# Patient Record
Sex: Female | Born: 1935
Health system: Southern US, Community
[De-identification: ages and names within clinical notes are randomized; demographics above are authoritative.]

## PROBLEM LIST (undated history)

## (undated) DIAGNOSIS — R319 Hematuria, unspecified: Secondary | ICD-10-CM

## (undated) DIAGNOSIS — H3530 Unspecified macular degeneration: Secondary | ICD-10-CM

## (undated) DIAGNOSIS — N2889 Other specified disorders of kidney and ureter: Secondary | ICD-10-CM

## (undated) DIAGNOSIS — R300 Dysuria: Secondary | ICD-10-CM

## (undated) DIAGNOSIS — N183 Chronic kidney disease, stage 3 unspecified: Secondary | ICD-10-CM

## (undated) DIAGNOSIS — H9190 Unspecified hearing loss, unspecified ear: Secondary | ICD-10-CM

## (undated) DIAGNOSIS — F0390 Unspecified dementia without behavioral disturbance: Secondary | ICD-10-CM

## (undated) DIAGNOSIS — Z87898 Personal history of other specified conditions: Secondary | ICD-10-CM

## (undated) DIAGNOSIS — R001 Bradycardia, unspecified: Secondary | ICD-10-CM

## (undated) DIAGNOSIS — Z8554 Personal history of malignant neoplasm of ureter: Secondary | ICD-10-CM

## (undated) DIAGNOSIS — Z8619 Personal history of other infectious and parasitic diseases: Secondary | ICD-10-CM

## (undated) HISTORY — PX: VAGINAL HYSTERECTOMY: SUR661

## (undated) HISTORY — PX: REFRACTIVE SURGERY: SHX103

## (undated) HISTORY — PX: APPENDECTOMY: SHX54

---

## 1998-05-21 ENCOUNTER — Other Ambulatory Visit: Admission: RE | Admit: 1998-05-21 | Discharge: 1998-05-21 | Payer: Self-pay | Admitting: Gynecology

## 1999-07-01 ENCOUNTER — Other Ambulatory Visit: Admission: RE | Admit: 1999-07-01 | Discharge: 1999-07-01 | Payer: Self-pay | Admitting: Gynecology

## 1999-07-27 ENCOUNTER — Encounter: Payer: Self-pay | Admitting: Gynecology

## 1999-07-27 ENCOUNTER — Encounter: Admission: RE | Admit: 1999-07-27 | Discharge: 1999-07-27 | Payer: Self-pay | Admitting: Gynecology

## 2000-08-01 ENCOUNTER — Encounter: Payer: Self-pay | Admitting: Gynecology

## 2000-08-01 ENCOUNTER — Other Ambulatory Visit: Admission: RE | Admit: 2000-08-01 | Discharge: 2000-08-01 | Payer: Self-pay | Admitting: Gynecology

## 2000-08-01 ENCOUNTER — Encounter: Admission: RE | Admit: 2000-08-01 | Discharge: 2000-08-01 | Payer: Self-pay | Admitting: Gynecology

## 2001-08-16 ENCOUNTER — Encounter: Payer: Self-pay | Admitting: Gynecology

## 2001-08-16 ENCOUNTER — Encounter: Admission: RE | Admit: 2001-08-16 | Discharge: 2001-08-16 | Payer: Self-pay | Admitting: Gynecology

## 2001-08-16 ENCOUNTER — Other Ambulatory Visit: Admission: RE | Admit: 2001-08-16 | Discharge: 2001-08-16 | Payer: Self-pay | Admitting: Gynecology

## 2002-08-27 ENCOUNTER — Encounter: Payer: Self-pay | Admitting: Gynecology

## 2002-08-27 ENCOUNTER — Encounter: Admission: RE | Admit: 2002-08-27 | Discharge: 2002-08-27 | Payer: Self-pay | Admitting: Gynecology

## 2002-09-02 ENCOUNTER — Other Ambulatory Visit: Admission: RE | Admit: 2002-09-02 | Discharge: 2002-09-02 | Payer: Self-pay | Admitting: Gynecology

## 2003-03-01 HISTORY — PX: CATARACT EXTRACTION W/ INTRAOCULAR LENS  IMPLANT, BILATERAL: SHX1307

## 2003-10-14 ENCOUNTER — Encounter: Admission: RE | Admit: 2003-10-14 | Discharge: 2003-10-14 | Payer: Self-pay | Admitting: Gynecology

## 2004-02-03 ENCOUNTER — Other Ambulatory Visit: Admission: RE | Admit: 2004-02-03 | Discharge: 2004-02-03 | Payer: Self-pay | Admitting: Gynecology

## 2004-10-28 ENCOUNTER — Encounter: Admission: RE | Admit: 2004-10-28 | Discharge: 2004-10-28 | Payer: Self-pay | Admitting: Gynecology

## 2005-05-10 ENCOUNTER — Other Ambulatory Visit: Admission: RE | Admit: 2005-05-10 | Discharge: 2005-05-10 | Payer: Self-pay | Admitting: Gynecology

## 2006-05-25 ENCOUNTER — Encounter: Admission: RE | Admit: 2006-05-25 | Discharge: 2006-05-25 | Payer: Self-pay | Admitting: Gynecology

## 2006-05-25 ENCOUNTER — Other Ambulatory Visit: Admission: RE | Admit: 2006-05-25 | Discharge: 2006-05-25 | Payer: Self-pay | Admitting: Gynecology

## 2007-08-27 ENCOUNTER — Ambulatory Visit (HOSPITAL_COMMUNITY): Admission: RE | Admit: 2007-08-27 | Discharge: 2007-08-27 | Payer: Self-pay | Admitting: Ophthalmology

## 2007-10-25 ENCOUNTER — Encounter: Admission: RE | Admit: 2007-10-25 | Discharge: 2007-10-25 | Payer: Self-pay | Admitting: Gynecology

## 2008-12-25 ENCOUNTER — Encounter: Admission: RE | Admit: 2008-12-25 | Discharge: 2008-12-25 | Payer: Self-pay | Admitting: Gynecology

## 2009-12-14 ENCOUNTER — Ambulatory Visit (HOSPITAL_COMMUNITY)
Admission: RE | Admit: 2009-12-14 | Discharge: 2009-12-14 | Payer: Self-pay | Source: Home / Self Care | Admitting: Ophthalmology

## 2010-01-26 ENCOUNTER — Encounter: Admission: RE | Admit: 2010-01-26 | Discharge: 2010-01-26 | Payer: Self-pay | Admitting: Gynecology

## 2011-03-21 ENCOUNTER — Other Ambulatory Visit: Payer: Self-pay | Admitting: Gynecology

## 2011-03-21 DIAGNOSIS — Z1231 Encounter for screening mammogram for malignant neoplasm of breast: Secondary | ICD-10-CM

## 2011-03-22 ENCOUNTER — Ambulatory Visit
Admission: RE | Admit: 2011-03-22 | Discharge: 2011-03-22 | Disposition: A | Discharge status: Home / Self Care | Payer: Medicare Other | Source: Ambulatory Visit | Attending: Gynecology | Admitting: Gynecology

## 2011-03-22 DIAGNOSIS — Z1231 Encounter for screening mammogram for malignant neoplasm of breast: Secondary | ICD-10-CM

## 2011-10-27 ENCOUNTER — Encounter: Payer: Self-pay | Admitting: Cardiology

## 2011-10-27 ENCOUNTER — Ambulatory Visit (INDEPENDENT_AMBULATORY_CARE_PROVIDER_SITE_OTHER): Payer: Medicare Other | Admitting: Cardiology

## 2011-10-27 VITALS — BP 150/75 | HR 53 | Ht 63.0 in | Wt 130.0 lb

## 2011-10-27 DIAGNOSIS — R55 Syncope and collapse: Secondary | ICD-10-CM | POA: Insufficient documentation

## 2011-10-27 NOTE — Progress Notes (Signed)
HPI The patient presents after a syncopal episode. She has no past cardiac history. She has had no previous cardiovascular studies. She was in her usual state of health Saturday when she was found down in her shower. She had apparently lost consciousness and was found by her husband. It's unclear how long she was down. She didn't have any loss of bowel or bladder. She had no trauma. She knew where she was when she came to. She did not have any prodrome her prior to this. She's not been having any palpitations or orthostatic symptoms. She had no presyncope. She denies any chest pressure, neck or arm discomfort. A relative took her blood pressure after this was normal as was her blood sugar. Before that she felt well and says that she felt well. She is active physically though she doesn't exercise. With activities such as vacuuming she's had no symptoms.  Allergies  Allergen Reactions  . Penicillins     Current Outpatient Prescriptions  Medication Sig Dispense Refill  . aspirin 81 MG tablet Take 81 mg by mouth daily.      . Cholecalciferol (VITAMIN D PO) Take by mouth.        Past Medical History  Diagnosis Date  . Syncope and collapse   . Memory loss   . Hearing difficulty     Past Surgical History  Procedure Date  . Appendectomy   . Vaginal hysterectomy     Family History  Problem Relation Age of Onset  . Coronary artery disease Father 37    Died 12s of an MI  . Coronary artery disease Brother 34  . Coronary artery disease Sister 40    History   Social History  . Marital Status: Married    Spouse Name: N/A    Number of Children: 2  . Years of Education: N/A   Occupational History  . Two part time jobs.     Social History Main Topics  . Smoking status: Former Smoker    Quit date: 10/27/1955  . Smokeless tobacco: Not on file  . Alcohol Use: Not on file  . Drug Use: Not on file  . Sexually Active: Not on file   Other Topics Concern  . Not on file   Social  History Narrative   Lives at home with husband.   She works at a [employer].    ROS:  Positive for hearing loss. Otherwise as stated in the history of present illness and negative for all other systems.  PHYSICAL EXAM BP 150/75  Pulse 53  Ht 5\' 3"  (1.6 m)  Wt 130 lb (58.968 kg)  BMI 23.03 kg/m2 GENERAL:  Well appearing HEENT:  Pupils equal round and reactive, fundi not visualized, oral mucosa unremarkable NECK:  No jugular venous distention, waveform within normal limits, carotid upstroke brisk and symmetric, no bruits, no thyromegaly LYMPHATICS:  No cervical, inguinal adenopathy LUNGS:  Clear to auscultation bilaterally BACK:  No CVA tenderness CHEST:  Unremarkable HEART:  PMI not displaced or sustained,S1 and S2 within normal limits, no S3, no S4, no clicks, no rubs, no murmurs ABD:  Flat, positive bowel sounds normal in frequency in pitch, no bruits, no rebound, no guarding, no midline pulsatile mass, no hepatomegaly, no splenomegaly EXT:  2 plus pulses throughout, no edema, no cyanosis no clubbing SKIN:  No rashes no nodules NEURO:  Cranial nerves II through XII grossly intact, motor grossly intact throughout PSYCH:  Cognitively intact, oriented to person place  and time   EKG:  Sinus bradycardia, rate 53, rightward axis, premature atrial contractions, nonspecific T-wave flattening.  ASSESSMENT AND PLAN  Syncope:  The patient had unexplained syncope that may have been a vagal episode. The only objective finding is bradycardia. I will apply a 48 hour Holter. However, given her completely benign exam I don't think that further evaluation is needed.  Bradycardia:  This will be evaluated as above.

## 2011-10-27 NOTE — Patient Instructions (Addendum)
The current medical regimen is effective;  continue present plan and medications.  Your physician has recommended that you wear a holter monitor for 48 hours. Holter monitors are medical devices that record the heart's electrical activity. Doctors most often use these monitors to diagnose arrhythmias. Arrhythmias are problems with the speed or rhythm of the heartbeat. The monitor is a small, portable device. You can wear one while you do your normal daily activities. This is usually used to diagnose what is causing palpitations/syncope (passing out).  Follow up in 1 month with Dr Hochrein.   

## 2011-11-02 ENCOUNTER — Telehealth: Payer: Self-pay | Admitting: Cardiology

## 2011-11-02 NOTE — Telephone Encounter (Signed)
Please return call to patient at work [phone] regarding test results

## 2011-11-02 NOTE — Telephone Encounter (Signed)
Reviewed results of holter monitor which demonstrated NSR and Sinus Huston Foley. Pt is aware that Dr Antoine Poche has not reviewed this results as of yet and I will call her back with his recommendations once he does.  Pt states understanding.

## 2011-11-23 ENCOUNTER — Ambulatory Visit (INDEPENDENT_AMBULATORY_CARE_PROVIDER_SITE_OTHER): Payer: Medicare Other | Admitting: Cardiology

## 2011-11-23 ENCOUNTER — Encounter: Payer: Self-pay | Admitting: Cardiology

## 2011-11-23 VITALS — BP 140/75 | HR 64 | Ht 63.0 in | Wt 131.0 lb

## 2011-11-23 DIAGNOSIS — R55 Syncope and collapse: Secondary | ICD-10-CM

## 2011-11-23 NOTE — Progress Notes (Signed)
    HPI The patient presents after a syncopal episode. She has no past cardiac history. She has had no previous cardiovascular studies. After the last visit I did order an Holter.  This demonstrated only normal sinus rhythm, PVCs, occasional short runs of atrial tachycardia.  Since I last saw her she has had no new cardiovascular complaints. She denies any chest pressure, neck or arm discomfort. She has no new shortness of breath, PND or orthopnea. She has no new palpitations, presyncope or syncope.  Allergies  Allergen Reactions  . Penicillins     Current Outpatient Prescriptions  Medication Sig Dispense Refill  . aspirin 81 MG tablet Take 81 mg by mouth daily.      . Cholecalciferol (VITAMIN D PO) Take by mouth.      . NON FORMULARY daily. i pad eye solution        Past Medical History  Diagnosis Date  . Syncope and collapse   . Memory loss   . Hearing difficulty     Past Surgical History  Procedure Date  . Appendectomy   . Vaginal hysterectomy     ROS:  Positive for hearing loss. Otherwise as stated in the history of present illness and negative for all other systems.  PHYSICAL EXAM BP 140/75  Pulse 64  Ht 5\' 3"  (1.6 m)  Wt 131 lb (59.421 kg)  BMI 23.21 kg/m2 GENERAL:  Well appearing NECK:  No jugular venous distention, waveform within normal limits, carotid upstroke brisk and symmetric, no bruits, no thyromegaly LUNGS:  Clear to auscultation bilaterally BACK:  No CVA tenderness CHEST:  Unremarkable HEART:  PMI not displaced or sustained,S1 and S2 within normal limits, no S3, no S4, no clicks, no rubs, no murmurs ABD:  Flat, positive bowel sounds normal in frequency in pitch, no bruits, no rebound, no guarding, no midline pulsatile mass, no hepatomegaly, no splenomegaly EXT:  2 plus pulses throughout, no edema, no cyanosis no clubbing   ASSESSMENT AND PLAN  Syncope:  She has had no further episodes. There is no clear etiology. She is to let us know if she has any  presyncope or further syncopal episodes.  Bradycardia:  At this point she seems to have normal chronotropic response on her Holter. Her average heart rate was 62 with no sustained bradycardia arrhythmias and no association of bradycardia with symptoms. At this point no pacemaker is indicated.

## 2011-11-28 ENCOUNTER — Encounter: Payer: Self-pay | Admitting: Cardiology

## 2012-03-19 ENCOUNTER — Other Ambulatory Visit: Payer: Self-pay | Admitting: Gynecology

## 2012-03-19 DIAGNOSIS — Z1231 Encounter for screening mammogram for malignant neoplasm of breast: Secondary | ICD-10-CM

## 2012-05-31 ENCOUNTER — Ambulatory Visit: Payer: Medicare Other

## 2012-06-04 ENCOUNTER — Other Ambulatory Visit: Payer: Self-pay | Admitting: Family Medicine

## 2012-06-04 DIAGNOSIS — Z1231 Encounter for screening mammogram for malignant neoplasm of breast: Secondary | ICD-10-CM

## 2012-06-28 ENCOUNTER — Ambulatory Visit
Admission: RE | Admit: 2012-06-28 | Discharge: 2012-06-28 | Disposition: A | Discharge status: Home / Self Care | Payer: Medicare Other | Source: Ambulatory Visit | Attending: Family Medicine | Admitting: Family Medicine

## 2012-06-28 ENCOUNTER — Ambulatory Visit: Payer: Medicare Other

## 2012-06-28 DIAGNOSIS — Z1231 Encounter for screening mammogram for malignant neoplasm of breast: Secondary | ICD-10-CM

## 2012-10-09 ENCOUNTER — Encounter: Payer: Self-pay | Admitting: Family Medicine

## 2012-10-09 ENCOUNTER — Ambulatory Visit (INDEPENDENT_AMBULATORY_CARE_PROVIDER_SITE_OTHER): Payer: Medicare Other | Admitting: Family Medicine

## 2012-10-09 VITALS — BP 142/67 | HR 55 | Temp 98.5°F | Ht 62.0 in | Wt 133.0 lb

## 2012-10-09 DIAGNOSIS — F0390 Unspecified dementia without behavioral disturbance: Secondary | ICD-10-CM

## 2012-10-09 LAB — POCT CBC
Granulocyte percent: 69.1 % (ref 37–80)
HCT, POC: 39.3 % (ref 37.7–47.9)
Hemoglobin: 13.1 g/dL (ref 12.2–16.2)
Lymph, poc: 1.5 (ref 0.6–3.4)
MCH, POC: 30.7 pg (ref 27–31.2)
MCHC: 33.3 g/dL (ref 31.8–35.4)
MCV: 92.2 fL (ref 80–97)
MPV: 8.6 fL (ref 0–99.8)
POC Granulocyte: 4.1 (ref 2–6.9)
POC LYMPH PERCENT: 24.4 % (ref 10–50)
Platelet Count, POC: 223 K/uL (ref 142–424)
RBC: 4.3 M/uL (ref 4.04–5.48)
RDW, POC: 14.4 %
WBC: 6 K/uL (ref 4.6–10.2)

## 2012-10-09 LAB — POCT GLYCOSYLATED HEMOGLOBIN (HGB A1C): Hemoglobin A1C: 5.1

## 2012-10-09 MED ORDER — DONEPEZIL HCL 5 MG PO TABS: 5.0000 mg | ORAL_TABLET | Freq: Every day | ORAL | Status: DC

## 2012-10-09 NOTE — Progress Notes (Signed)
  Subjective:    Patient ID: Katie Huerta, female    DOB: 10-28-1927, 79 y.o.   MRN: [id number]  HPI  Patient presents today with chief complaint of dementia. Husband states the patient had progressive decrease in memory of the past 3-5 years. Husband states the patient had recurrent episodes of forgetting short term as well as long term events. Patient is had issues with recurring episodes with money as well as dates and times of events. Husband has had to help patient keep up with medications at home. Patient denies any prior history of hypertension or diabetes. No history of stroke or heart attacks in the past. Patient did have a fall about 4-6 months ago that did not involve any head trauma. No recent infections. Appetite has been stable. Patient is able to eat for herself.  Does cooking and some cleaning.  No recent changes in behavior.   Review of Systems  All other systems reviewed and are negative.       Objective:   Physical Exam  Constitutional: She appears well-developed and well-nourished.  HENT:  Head: Normocephalic and atraumatic.  Right Ear: External ear normal.  Left Ear: External ear normal.  Eyes: Conjunctivae are normal. Pupils are equal, round, and reactive to light.  Neck: Normal range of motion.  Cardiovascular: Normal rate and regular rhythm.   Pulmonary/Chest: Effort normal and breath sounds normal.  Abdominal: Soft.  Neurological: She is alert. She displays normal reflexes. No cranial nerve deficit. Coordination normal.  Oriented to person place not time  Skin: Skin is warm.     Mini-Mental Status exam score of 23     Assessment & Plan:  Dementia, without behavioral disturbance - Plan: donepezil (ARICEPT) 5 MG tablet, POCT CBC, Comprehensive metabolic panel, TSH, POCT glycosylated hemoglobin (Hb A1C)  Patient clinically with mild dementia. Will start patient on low-dose Aricept. Will also check baseline labs including CBC, CMP, A1c,  TSH. Plan for followup in the next 3-6 months for reevaluation of symptoms. Discussed general red flags. Followup as needed

## 2012-10-10 LAB — COMPREHENSIVE METABOLIC PANEL
ALT: 11 IU/L (ref 0–32)
AST: 17 IU/L (ref 0–40)
Albumin: 4.1 g/dL (ref 3.5–4.7)
BUN: 17 mg/dL (ref 8–27)
Calcium: 9 mg/dL (ref 8.6–10.2)
Chloride: 104 mmol/L (ref 97–108)
GFR calc non Af Amer: 73 mL/min/{1.73_m2} (ref >59)
Globulin, Total: 2 g/dL (ref 1.5–4.5)
Potassium: 4.3 mmol/L (ref 3.5–5.2)
Total Protein: 6.1 g/dL (ref 6.0–8.5)

## 2012-10-16 ENCOUNTER — Telehealth: Payer: Self-pay | Admitting: Family Medicine

## 2012-10-16 NOTE — Telephone Encounter (Signed)
Patient aware copy up front for them to pick up

## 2013-02-19 ENCOUNTER — Ambulatory Visit: Payer: Medicare Other | Admitting: Family Medicine

## 2013-03-04 ENCOUNTER — Ambulatory Visit (INDEPENDENT_AMBULATORY_CARE_PROVIDER_SITE_OTHER): Payer: Medicare Other | Admitting: Family Medicine

## 2013-03-04 ENCOUNTER — Encounter: Payer: Self-pay | Admitting: Family Medicine

## 2013-03-04 VITALS — BP 127/74 | HR 70 | Temp 99.8°F | Ht 62.0 in | Wt 130.0 lb

## 2013-03-04 DIAGNOSIS — R55 Syncope and collapse: Secondary | ICD-10-CM

## 2013-03-04 DIAGNOSIS — R32 Unspecified urinary incontinence: Secondary | ICD-10-CM

## 2013-03-04 DIAGNOSIS — N390 Urinary tract infection, site not specified: Secondary | ICD-10-CM

## 2013-03-04 LAB — POCT CBC
GRANULOCYTE PERCENT: 72.4 % (ref 37–80)
HEMATOCRIT: 39.1 % (ref 37.7–47.9)
HEMOGLOBIN: 12.7 g/dL (ref 12.2–16.2)
Lymph, poc: 1.2 (ref 0.6–3.4)
MCH, POC: 29.4 pg (ref 27–31.2)
MCHC: 32.4 g/dL (ref 31.8–35.4)
MCV: 90.8 fL (ref 80–97)
MPV: 8.8 fL (ref 0–99.8)
POC Granulocyte: 4.9 (ref 2–6.9)
POC LYMPH PERCENT: 18 %L (ref 10–50)
Platelet Count, POC: 264 10*3/uL (ref 142–424)
RBC: 4.3 M/uL (ref 4.04–5.48)
RDW, POC: 13.7 %
WBC: 6.7 10*3/uL (ref 4.6–10.2)

## 2013-03-04 LAB — POCT UA - MICROSCOPIC ONLY
Casts, Ur, LPF, POC: NEGATIVE
Crystals, Ur, HPF, POC: NEGATIVE
YEAST UA: NEGATIVE

## 2013-03-04 LAB — POCT URINALYSIS DIPSTICK
BILIRUBIN UA: NEGATIVE
GLUCOSE UA: NEGATIVE
Ketones, UA: NEGATIVE
NITRITE UA: POSITIVE
SPEC GRAV UA: 1.025
UROBILINOGEN UA: NEGATIVE
pH, UA: 6

## 2013-03-04 LAB — POCT CBG (FASTING - GLUCOSE)-MANUAL ENTRY: Glucose Fasting, POC: 91 mg/dL (ref 70–99)

## 2013-03-04 MED ORDER — CIPROFLOXACIN HCL 500 MG PO TABS: 500.0000 mg | ORAL_TABLET | Freq: Two times a day (BID) | ORAL | Status: DC

## 2013-03-04 NOTE — Progress Notes (Signed)
   Subjective:    Patient ID: Katie Huerta, female    DOB: 1927-03-28, 80 y.o.   MRN: [id number]  HPI Patient presents today for evaluation of presyncopal episodes. The patient is known to have been recently started on Aricept for mild dementia about 4 months ago. Husband states the patient has had intermittent episodes of presyncope over the past one to 2 months. Patient had a presyncopal episode at church about 3 weeks ago. EMS was called and time. Blood sugar and blood pressure was otherwise within normal limits. There was no loss of consciousness. Patient also had another episode about one week ago with similar symptoms at home. Patient denies any chest pains or shortness of breath. No hemiparesis or confusion. Husband reports the patient has been generally fatigued at all in addition to having decreased appetite. Of note, patient had a fairly extensive workup of syncope was otherwise unremarkable findings about 2 years ago with cardiology. There was a question of whether patient would have gotten a pacemaker for bradycardia however this was deferred as patient clinically resolved after evaluation.  Review of Systems  All other systems reviewed and are negative.       Objective:   Physical Exam  Constitutional: She is oriented to person, place, and time. She appears well-developed and well-nourished.  HENT:  Head: Normocephalic and atraumatic.  Eyes: Conjunctivae are normal. Pupils are equal, round, and reactive to light.  Neck: Normal range of motion. Neck supple.  Cardiovascular: Normal rate and regular rhythm.   Pulmonary/Chest: Effort normal and breath sounds normal.  Abdominal: Soft.  Neurological: She is alert and oriented to person, place, and time.  Skin: Skin is warm.   Filed Vitals:   03/04/13 1102  BP: 127/74  Pulse: 70  Temp:           Assessment & Plan:  Pre-syncope - Plan: POCT CBC, POCT CBG (Fasting - Glucose), EKG 12-Lead, TSH, Vitamin D  pnl(25-hydrxy+1,25-dihy)-bld, Comprehensive metabolic panel, Ambulatory referral to Cardiology  Incontinence - Plan: POCT UA - Microscopic Only, POCT urinalysis dipstick, Urine culture, CANCELED: Microalbumin, urine  UTI (urinary tract infection) - Plan: ciprofloxacin (CIPRO) 500 MG tablet  Workup today otherwise within normal limits. Blood sugar and hemoglobin within normal limits. Orthostatics also within normal limits. There are signs of infection patient's urinalysis today. Unclear this is a red herring as patient is otherwise clinically asymptomatic and has had issues with presyncoe in the past. However, will treat with Cipro x7 days. Urine culture. Will also check vit D level, CMET and TSH.   pro-cholinergic effects of Aricept may also be contributing to symptoms. Will discontinue this medication today. Also discussed with patient she may want to followup with cardiology as symptoms have seemed to occur ( as there was a question of need for pacemaker 2/2 bradycardia).  Discussed syncopal red plaques with patient and husband. Followup as needed.

## 2013-03-04 NOTE — Addendum Note (Signed)
Addended by: Earlene Plater on: 03/04/2013 03:08 PM   Modules accepted: Orders

## 2013-03-05 LAB — COMPREHENSIVE METABOLIC PANEL
ALK PHOS: 91 IU/L (ref 39–117)
ALT: 22 IU/L (ref 0–32)
AST: 18 IU/L (ref 0–40)
Albumin/Globulin Ratio: 1.3 (ref 1.1–2.5)
Albumin: 3.7 g/dL (ref 3.5–4.7)
BILIRUBIN TOTAL: 0.3 mg/dL (ref 0.0–1.2)
BUN / CREAT RATIO: 29 — AB (ref 11–26)
BUN: 24 mg/dL (ref 8–27)
CO2: 23 mmol/L (ref 18–29)
CREATININE: 0.82 mg/dL (ref 0.57–1.00)
Calcium: 9.3 mg/dL (ref 8.6–10.2)
Chloride: 105 mmol/L (ref 97–108)
GFR calc Af Amer: 77 mL/min/{1.73_m2} (ref >59)
GFR, EST NON AFRICAN AMERICAN: 66 mL/min/{1.73_m2} (ref >59)
Globulin, Total: 2.9 g/dL (ref 1.5–4.5)
Glucose: 103 mg/dL — ABNORMAL HIGH (ref 65–99)
POTASSIUM: 4.8 mmol/L (ref 3.5–5.2)
SODIUM: 148 mmol/L — AB (ref 134–144)
Total Protein: 6.6 g/dL (ref 6.0–8.5)

## 2013-03-05 LAB — VITAMIN D 25 HYDROXY (VIT D DEFICIENCY, FRACTURES): VIT D 25 HYDROXY: 28.1 ng/mL — AB (ref 30.0–100.0)

## 2013-03-05 LAB — TSH: TSH: 2.69 u[IU]/mL (ref 0.450–4.500)

## 2013-03-06 LAB — URINE CULTURE

## 2013-03-12 ENCOUNTER — Other Ambulatory Visit: Payer: Self-pay | Admitting: Family Medicine

## 2013-03-12 MED ORDER — CALCIUM-VITAMIN D3 600-400 MG-UNIT PO CAPS
2.0000 | ORAL_CAPSULE | Freq: Every morning | ORAL | Status: DC
Start: 2013-05-08 — End: 2015-11-05

## 2013-03-12 MED ORDER — CHOLECALCIFEROL 1.25 MG (50000 UT) PO TABS: 1.0000 | ORAL_TABLET | ORAL | Status: AC

## 2013-04-17 ENCOUNTER — Ambulatory Visit: Payer: Medicare Other | Admitting: Cardiology

## 2013-06-12 ENCOUNTER — Ambulatory Visit (INDEPENDENT_AMBULATORY_CARE_PROVIDER_SITE_OTHER): Payer: Medicare Other | Admitting: Cardiology

## 2013-06-12 VITALS — BP 110/68 | HR 56 | Ht 63.5 in | Wt 134.0 lb

## 2013-06-12 DIAGNOSIS — R55 Syncope and collapse: Secondary | ICD-10-CM

## 2013-06-12 NOTE — Patient Instructions (Signed)
The current medical regimen is effective;  continue present plan and medications.  Follow up as needed 

## 2013-06-12 NOTE — Progress Notes (Signed)
    HPI The patient presents after a syncopal episode. She has no past cardiac history. She has had no previous cardiovascular studies. Holter demonstrated only normal sinus rhythm, PVCs, occasional short runs of atrial tachycardia.  Since I last saw her she has had no new cardiovascular complaints. She denies any chest pressure, neck or arm discomfort. She has no new shortness of breath, PND or orthopnea. She has no new palpitations, presyncope or syncope.  Allergies  Allergen Reactions  . Penicillins     Current Outpatient Prescriptions  Medication Sig Dispense Refill  . aspirin 81 MG tablet Take 81 mg by mouth daily.      . Calcium Carb-Cholecalciferol (CALCIUM-VITAMIN D3) 600-400 MG-UNIT CAPS Take 2 tablets by mouth every morning.  60 capsule  11  . donepezil (ARICEPT) 5 MG tablet Take 1 tablet (5 mg total) by mouth at bedtime.  30 tablet  6  . latanoprost (XALATAN) 0.005 % ophthalmic solution        No current facility-administered medications for this visit.    Past Medical History  Diagnosis Date  . Syncope and collapse   . Memory loss   . Hearing difficulty     Past Surgical History  Procedure Laterality Date  . Appendectomy    . Vaginal hysterectomy      ROS:  Positive for hearing loss. Otherwise as stated in the history of present illness and negative for all other systems.  PHYSICAL EXAM BP 110/68  Pulse 56  Ht 5' 3.5" (1.613 m)  Wt 134 lb (60.782 kg)  BMI 23.36 kg/m2 GENERAL:  Well appearing NECK:  No jugular venous distention, waveform within normal limits, carotid upstroke brisk and symmetric, no bruits, no thyromegaly LUNGS:  Clear to auscultation bilaterally BACK:  No CVA tenderness CHEST:  Unremarkable HEART:  PMI not displaced or sustained,S1 and S2 within normal limits, no S3, no S4, no clicks, no rubs, no murmurs ABD:  Flat, positive bowel sounds normal in frequency in pitch, no bruits, no rebound, no guarding, no midline pulsatile mass, no  hepatomegaly, no splenomegaly EXT:  2 plus pulses throughout, no edema, no cyanosis no clubbing  EKG:  Sinus rhythm, rate 56, axis within normal limits, intervals within normal limits, no acute ST-T wave changes.  06/12/2013   ASSESSMENT AND PLAN  Syncope:  She has had no further episodes. There is no clear etiology. No further work up is needed.    Bradycardia:    No further work up is needed.

## 2013-06-17 ENCOUNTER — Encounter: Payer: Self-pay | Admitting: Cardiology

## 2014-12-11 ENCOUNTER — Ambulatory Visit (INDEPENDENT_AMBULATORY_CARE_PROVIDER_SITE_OTHER): Payer: Medicare Other

## 2014-12-11 DIAGNOSIS — Z23 Encounter for immunization: Secondary | ICD-10-CM | POA: Diagnosis not present

## 2015-03-09 ENCOUNTER — Ambulatory Visit (INDEPENDENT_AMBULATORY_CARE_PROVIDER_SITE_OTHER): Payer: Medicare Other | Admitting: Pediatrics

## 2015-03-09 ENCOUNTER — Encounter: Payer: Self-pay | Admitting: Pediatrics

## 2015-03-09 VITALS — BP 147/75 | HR 65 | Temp 97.1°F | Ht 63.5 in | Wt 127.0 lb

## 2015-03-09 DIAGNOSIS — R32 Unspecified urinary incontinence: Secondary | ICD-10-CM

## 2015-03-09 DIAGNOSIS — F0390 Unspecified dementia without behavioral disturbance: Secondary | ICD-10-CM

## 2015-03-09 DIAGNOSIS — H66001 Acute suppurative otitis media without spontaneous rupture of ear drum, right ear: Secondary | ICD-10-CM | POA: Diagnosis not present

## 2015-03-09 LAB — POCT URINALYSIS DIPSTICK
Bilirubin, UA: NEGATIVE
GLUCOSE UA: NEGATIVE
Ketones, UA: NEGATIVE
NITRITE UA: POSITIVE
Spec Grav, UA: 1.02
UROBILINOGEN UA: NEGATIVE
pH, UA: 5

## 2015-03-09 LAB — POCT UA - MICROSCOPIC ONLY
CRYSTALS, UR, HPF, POC: NEGATIVE
Casts, Ur, LPF, POC: NEGATIVE
Mucus, UA: NEGATIVE
Yeast, UA: NEGATIVE

## 2015-03-09 MED ORDER — AZITHROMYCIN 250 MG PO TABS: ORAL_TABLET | ORAL | Status: DC

## 2015-03-09 NOTE — Progress Notes (Signed)
Subjective:    Patient ID: Katie Huerta, female    DOB: 1927/08/17, 82 y.o.   MRN: [id number]  CC: Cerumen Impaction   HPI: Katie Huerta is a 82 y.o. female presenting for Cerumen Impaction  Husband thinks she isnt hearing normally and needs ears cleaned out, mentions several times maybe cleaning out her ears will help with her memory He does the medicines for them both, she has had some short term memory problems for a while Will repeat herself, forget things that they talk about together Has had some cough past few days At times has said her ear hurts Husband thinks her memory has been getting worse  He also says she has had some urinary incontinence over past few weeks that is new for her. Going to the bathroom sometimes multiple times in an hour. Denies dysuria.    Depression screen Katie Huerta 2/9 03/09/2015 03/04/2013  Decreased Interest 0 3  Down, Depressed, Hopeless 0 0  PHQ - 2 Score 0 3  Altered sleeping - 0  Tired, decreased energy - 3  Change in appetite - 3  Feeling bad or failure about yourself  - 0  Trouble concentrating - 2  Moving slowly or fidgety/restless - 0  Suicidal thoughts - 0  PHQ-9 Score - 11     Relevant past medical, surgical, family and social history reviewed and updated as indicated. Interim medical history since our last visit reviewed. Allergies and medications reviewed and updated.    ROS: Per HPI unless specifically indicated above  History  Smoking status  . Former Smoker  . Quit date: 10/27/1955  Smokeless tobacco  . Not on file    Past Medical History Patient Active Problem List   Diagnosis Date Noted  . Syncope 10/27/2011        Objective:    BP 147/75 mmHg  Pulse 65  Temp(Src) 97.1 F (36.2 C) (Oral)  Ht 5' 3.5" (1.613 m)  Wt 127 lb (57.607 kg)  BMI 22.14 kg/m2  Wt Readings from Last 3 Encounters:  03/09/15 127 lb (57.607 kg)  06/12/13 134 lb (60.782 kg)  03/04/13 130 lb (58.968 kg)    Gen: very  pleasant, NAD, alert, cooperative with exam, NCAT EYES: EOMI, no scleral injection or icterus ENT:  Hard cerumen impaction b/l. Able to irrigate out small amount of wax R side revealing a bright red, bulging TM. L TM remained obscurred by hard cerumen after copious irrigation. OP without erythema LYMPH: no cervical LAD CV: NRRR, normal S1/S2, no murmur, distal pulses 2+ b/l Resp: CTABL, no wheezes, normal WOB Abd: +BS, soft, NTND. no guarding or organomegaly Ext: No edema, warm Neuro: Alert, oriented to self, husband, said it was 2012, not sure who the president is.   strength equal b/l UE and LE, coordination grossly normal MSK: normal muscle bulk     Assessment & Plan:    Katie Huerta was seen today for cerumen impaction and memory problems. Found to have AOM R side. Will treat with azithrom as pt allergic to PCN, also will get labs for dementia, send UA for new urinary incontinence. RTC in 2 weeks for CPE and AWV.  Diagnoses and all orders for this visit:  Dementia, without behavioral disturbance -     CBC -     BMP8+EGFR -     TSH -     Vitamin B12 -     Folate  Urinary incontinence, unspecified incontinence type -     POCT  UA - Microscopic Only -     POCT urinalysis dipstick -     Urine culture  Acute suppurative otitis media of right ear without spontaneous rupture of tympanic membrane, recurrence not specified -     azithromycin (ZITHROMAX) 250 MG tablet; Take 2 the first day and then one each day after.    Follow up plan: Return in about 2 weeks (around 03/23/2015).  Assunta Found, MD [facility] 03/09/2015, 4:44 PM

## 2015-03-09 NOTE — Patient Instructions (Signed)
Debrox drops to loosen wax both ears twice a day  Take azithromycin two pills first day then one pill for four more days

## 2015-03-10 LAB — BMP8+EGFR
BUN / CREAT RATIO: 23 (ref 11–26)
BUN: 18 mg/dL (ref 8–27)
CALCIUM: 8.9 mg/dL (ref 8.7–10.3)
CHLORIDE: 102 mmol/L (ref 96–106)
CO2: 25 mmol/L (ref 18–29)
CREATININE: 0.8 mg/dL (ref 0.57–1.00)
GFR calc Af Amer: 78 mL/min/{1.73_m2} (ref >59)
GFR calc non Af Amer: 67 mL/min/{1.73_m2} (ref >59)
GLUCOSE: 99 mg/dL (ref 65–99)
Potassium: 3.7 mmol/L (ref 3.5–5.2)
Sodium: 144 mmol/L (ref 134–144)

## 2015-03-10 LAB — FOLATE: FOLATE: 9.7 ng/mL (ref >3.0)

## 2015-03-10 LAB — CBC
HEMOGLOBIN: 13.3 g/dL (ref 11.1–15.9)
Hematocrit: 40.5 % (ref 34.0–46.6)
MCH: 29.6 pg (ref 26.6–33.0)
MCHC: 32.8 g/dL (ref 31.5–35.7)
MCV: 90 fL (ref 79–97)
PLATELETS: 340 10*3/uL (ref 150–379)
RBC: 4.49 x10E6/uL (ref 3.77–5.28)
RDW: 14.3 % (ref 12.3–15.4)
WBC: 6.8 10*3/uL (ref 3.4–10.8)

## 2015-03-10 LAB — VITAMIN B12: VITAMIN B 12: 193 pg/mL — AB (ref 211–946)

## 2015-03-10 LAB — TSH: TSH: 3.48 u[IU]/mL (ref 0.450–4.500)

## 2015-03-11 LAB — URINE CULTURE

## 2015-03-12 ENCOUNTER — Telehealth: Payer: Self-pay | Admitting: Pediatrics

## 2015-03-12 DIAGNOSIS — N3000 Acute cystitis without hematuria: Secondary | ICD-10-CM

## 2015-03-12 DIAGNOSIS — E538 Deficiency of other specified B group vitamins: Secondary | ICD-10-CM

## 2015-03-12 MED ORDER — SULFAMETHOXAZOLE-TRIMETHOPRIM 800-160 MG PO TABS: 1.0000 | ORAL_TABLET | Freq: Two times a day (BID) | ORAL | Status: DC

## 2015-03-12 NOTE — Telephone Encounter (Signed)
Pt husband aware

## 2015-03-12 NOTE — Telephone Encounter (Signed)
Please let pt know--does have a urinary tract infection. I sent in bactrim to her pharmacy, should take one tab BID for 5 days. Her vitamin B12 level was low, to confirm that it is B12 deficinecy she needs two more labs drawn. She can have that drawn at her 2 week follow up or if she can come in sooner we should have those results by then.

## 2015-03-25 ENCOUNTER — Encounter: Payer: Self-pay | Admitting: Pediatrics

## 2015-03-25 ENCOUNTER — Ambulatory Visit (INDEPENDENT_AMBULATORY_CARE_PROVIDER_SITE_OTHER): Payer: Medicare Other | Admitting: Pediatrics

## 2015-03-25 VITALS — BP 117/72 | HR 88 | Temp 97.0°F | Ht 63.5 in | Wt 128.2 lb

## 2015-03-25 DIAGNOSIS — H9193 Unspecified hearing loss, bilateral: Secondary | ICD-10-CM

## 2015-03-25 DIAGNOSIS — H9190 Unspecified hearing loss, unspecified ear: Secondary | ICD-10-CM | POA: Diagnosis not present

## 2015-03-25 DIAGNOSIS — E538 Deficiency of other specified B group vitamins: Secondary | ICD-10-CM

## 2015-03-25 DIAGNOSIS — F0390 Unspecified dementia without behavioral disturbance: Secondary | ICD-10-CM

## 2015-03-25 NOTE — Patient Instructions (Signed)
Eat three meals each day  Try to get a fruit or vegetable in each meal  Take multivitamin daily  I will let you know if we need to start you on B12 supplement after I get these labs back

## 2015-03-25 NOTE — Progress Notes (Addendum)
Subjective:    Patient ID: Katie Huerta, female    DOB: [birth year], 82 y.o.   MRN: [id number]  CC: Follow-up memory loss  HPI: Katie Huerta is a 82 y.o. female presenting for Follow-up  Decreased appetite per husband Eats something for each meal of the day Not always what husband thinks is best Still with some urinary frequency during the day, husband think she forgets that she has gone and then will go again. Pt denies that she goes often during the day No dysuria No fevers Pt says she is feeling well She has no complaints No falls at home No pain in LE Husband reminds her about medicines She forgets things frequently per him, she does not think she forgets things Pt lives alone with wife.  Sons are HCPOA, live nearby. Pt thinks that they are doing ok living alone but does feel like he is responsible for a lot They have considered moving into [place], but arent ready to do it yet Husband thinks if her hearing gets fixed that her memory will be better Stopped taking aricept after she had two episodes of blacking out two years ago. No further syncopal episodes   MMSE - Mini Mental State Exam 03/25/2015  Orientation to time 2  Orientation to Place 5  Registration 3  Attention/ Calculation 5  Recall 0  Language- name 2 objects 2  Language- repeat 1  Language- follow 3 step command 3  Language- read & follow direction 1  Write a sentence 1  Copy design 1  Total score 24      Depression screen [facility] 2/9 03/25/2015 03/09/2015 03/04/2013  Decreased Interest 0 0 3  Down, Depressed, Hopeless 0 0 0  PHQ - 2 Score 0 0 3  Altered sleeping - - 0  Tired, decreased energy - - 3  Change in appetite - - 3  Feeling bad or failure about yourself  - - 0  Trouble concentrating - - 2  Moving slowly or fidgety/restless - - 0  Suicidal thoughts - - 0  PHQ-9 Score - - 11     Relevant past medical, surgical, family and social history reviewed and updated as  indicated. Interim medical history since our last visit reviewed. Allergies and medications reviewed and updated.    ROS: Per HPI unless specifically indicated above  History  Smoking status  . Former Smoker  . Quit date: 10/27/1955  Smokeless tobacco  . Not on file    Past Medical History Patient Active Problem List   Diagnosis Date Noted  . Dementia 03/25/2015  . Hearing decreased 03/25/2015  . Syncope 10/27/2011    Current Outpatient Prescriptions  Medication Sig Dispense Refill  . aspirin 81 MG tablet Take 81 mg by mouth daily.    . Calcium Carb-Cholecalciferol (CALCIUM-VITAMIN D3) 600-400 MG-UNIT CAPS Take 2 tablets by mouth every morning. 60 capsule 11  . latanoprost (XALATAN) 0.005 % ophthalmic solution      No current facility-administered medications for this visit.       Objective:    BP 117/72 mmHg  Pulse 88  Temp(Src) 97 F (36.1 C) (Oral)  Ht 5' 3.5" (1.613 m)  Wt 128 lb 3.2 oz (58.151 kg)  BMI 22.35 kg/m2  Wt Readings from Last 3 Encounters:  03/25/15 128 lb 3.2 oz (58.151 kg)  03/09/15 127 lb (57.607 kg)  06/12/13 134 lb (60.782 kg)     Gen: NAD, alert, cooperative with exam, NCAT EYES: EOMI, no scleral  injection or icterus ENT:  TMs pearly gray b/l, OP without erythema LYMPH: no cervical LAD CV: NRRR, normal S1/S2, no murmur, distal pulses 2+ b/l Resp: CTABL, no wheezes, normal WOB Abd: +BS, soft, NTND. no guarding or organomegaly Ext: No edema, warm Neuro: Alert, strength equal b/l UE and LE, coordination grossly normal MSK: normal muscle bulk     Assessment & Plan:    Katie Huerta was seen today for follow-up dementia. Pt with little insight into deficits. Husband feels comfortable taking care of her at home, he does still wish that she would have improved memory because he has to do a lot of remembering for her. Encouraged him to continue to look into nearby assisted living facilities that may help take some of the burden off of him. Pt  able to complete her ADLs, showers herself. MMSE 24. B12 level low, will check below labs, start B12 replacement if indicated. Encouraged pt to follow up with audiology for hearing.  Diagnoses and all orders for this visit:  Low vitamin B12 level -     Homocysteine -     Methylmalonic acid, serum  Dementia, without behavioral disturbance  Hearing decreased, bilateral  Hearing loss, unspecified laterality -     Ambulatory referral to Audiology    I spent 25 minutes with the patient with over 50% of the encounter time dedicated to counseling on the above problems.   Follow up plan: Return in about 8 weeks (around 05/20/2015). Memory loss  Assunta Found, MD [facility] 03/25/2015, 10:46 AM  ADDENDUM: MMA and homocysteine levels both elevated, consistent with B 12 deficiency. Will start PO 2063mcg B12 replacement. Recheck B12 level in 3 months. If not improving switch to IM B12.

## 2015-03-26 LAB — SPECIMEN STATUS

## 2015-03-29 LAB — HOMOCYSTEINE: Homocysteine: 27.8 umol/L — ABNORMAL HIGH (ref 0.0–15.0)

## 2015-03-29 LAB — METHYLMALONIC ACID, SERUM: Methylmalonic Acid: 413 nmol/L — ABNORMAL HIGH (ref 0–378)

## 2015-03-30 MED ORDER — B-12 2000 MCG PO TABS: 2000.0000 ug | ORAL_TABLET | Freq: Every day | ORAL | Status: DC

## 2015-03-30 NOTE — Addendum Note (Signed)
Addended by: [name] on: 03/30/2015 07:50 AM   Modules accepted: Orders

## 2015-04-07 ENCOUNTER — Encounter: Payer: Self-pay | Admitting: *Deleted

## 2015-05-21 ENCOUNTER — Ambulatory Visit (INDEPENDENT_AMBULATORY_CARE_PROVIDER_SITE_OTHER): Payer: Medicare Other | Admitting: Pediatrics

## 2015-05-21 ENCOUNTER — Encounter: Payer: Self-pay | Admitting: Pediatrics

## 2015-05-21 VITALS — BP 128/80 | HR 54 | Temp 97.0°F | Ht 63.5 in | Wt 130.0 lb

## 2015-05-21 DIAGNOSIS — R030 Elevated blood-pressure reading, without diagnosis of hypertension: Secondary | ICD-10-CM

## 2015-05-21 DIAGNOSIS — F0390 Unspecified dementia without behavioral disturbance: Secondary | ICD-10-CM | POA: Diagnosis not present

## 2015-05-21 DIAGNOSIS — E538 Deficiency of other specified B group vitamins: Secondary | ICD-10-CM | POA: Diagnosis not present

## 2015-05-21 DIAGNOSIS — IMO0001 Reserved for inherently not codable concepts without codable children: Secondary | ICD-10-CM

## 2015-05-21 NOTE — Progress Notes (Signed)
Subjective:    Patient ID: Katie Huerta, female    DOB: [birth year], 82 y.o.   MRN: [id number]  CC: Follow-up BP, memory  HPI: Katie Huerta is a 82 y.o. female presenting for Follow-up  Feeling well No complaints Husband says he still does all of the reminding of when to take medicines He does much of the required ADLs around the house, though pt able to bath herself, dress herself. No numbness/tingling in feet Has been taking b12 PO No headaches, no chest pain, no dysuria, normal appetite   Depression screen [facility] 2/9 05/21/2015 03/25/2015 03/09/2015 03/04/2013  Decreased Interest 0 0 0 3  Down, Depressed, Hopeless 0 0 0 0  PHQ - 2 Score 0 0 0 3  Altered sleeping - - - 0  Tired, decreased energy - - - 3  Change in appetite - - - 3  Feeling bad or failure about yourself  - - - 0  Trouble concentrating - - - 2  Moving slowly or fidgety/restless - - - 0  Suicidal thoughts - - - 0  PHQ-9 Score - - - 11     Relevant past medical, surgical, family and social history reviewed and updated as indicated. Interim medical history since our last visit reviewed. Allergies and medications reviewed and updated.    ROS: Per HPI unless specifically indicated above  History  Smoking status  . Former Smoker  . Quit date: 10/27/1955  Smokeless tobacco  . Not on file    Past Medical History Patient Active Problem List   Diagnosis Date Noted  . Dementia 03/25/2015  . Hearing decreased 03/25/2015  . Syncope 10/27/2011    Current Outpatient Prescriptions  Medication Sig Dispense Refill  . aspirin 81 MG tablet Take 81 mg by mouth daily.    . Calcium Carb-Cholecalciferol (CALCIUM-VITAMIN D3) 600-400 MG-UNIT CAPS Take 2 tablets by mouth every morning. 60 capsule 11  . Cyanocobalamin (B-12) 2000 MCG TABS Take 2,000 mcg by mouth daily. 30 tablet 6  . latanoprost (XALATAN) 0.005 % ophthalmic solution      No current facility-administered medications for this visit.       Objective:    BP 128/80 mmHg  Pulse 54  Temp(Src) 97 F (36.1 C) (Oral)  Ht 5' 3.5" (1.613 m)  Wt 130 lb (58.968 kg)  BMI 22.66 kg/m2  Wt Readings from Last 3 Encounters:  05/21/15 130 lb (58.968 kg)  03/25/15 128 lb 3.2 oz (58.151 kg)  03/09/15 127 lb (57.607 kg)   Recheck BP 128/80 manually  Gen: NAD, alert, cooperative with exam, NCAT EYES: EOMI, no scleral injection or icterus ENT: OP without erythema LYMPH: no cervical LAD CV: NRRR, normal S1/S2, no murmur, distal pulses 2+ b/l Resp: CTABL, no wheezes, normal WOB Abd: +BS, soft, NTND.  Ext: No edema, warm Neuro: Alert  MSK: normal muscle bulk     Assessment & Plan:    Katie Huerta was seen today for follow-up med problems.  Diagnoses and all orders for this visit:  Dementia, without behavioral disturbance Pt with minimal insight, does say she knows her husband does all the remembering for them. Husband getting tired, says he wants to make his own separate appt to discuss nerves in the future. Discussed again using pill box for husband to fill weekly instead of daily going through whether or not pt has taken her medicines.  Elevated blood pressure Normal at recheck Check BP at drug stores  B12 def Continue PO B12, recheck  level next visit  Follow up plan: Return in about 4 months (around 09/20/2015) for follow up med problems.  Needs repeat B12 level at that time. If still low switch to IM injections  Assunta Found, MD [facility] 05/21/2015, 5:36 PM

## 2015-05-27 DIAGNOSIS — H353211 Exudative age-related macular degeneration, right eye, with active choroidal neovascularization: Secondary | ICD-10-CM | POA: Diagnosis not present

## 2015-05-27 DIAGNOSIS — H35372 Puckering of macula, left eye: Secondary | ICD-10-CM | POA: Diagnosis not present

## 2015-05-27 DIAGNOSIS — H353222 Exudative age-related macular degeneration, left eye, with inactive choroidal neovascularization: Secondary | ICD-10-CM | POA: Diagnosis not present

## 2015-05-27 DIAGNOSIS — H35423 Microcystoid degeneration of retina, bilateral: Secondary | ICD-10-CM | POA: Diagnosis not present

## 2015-06-05 DIAGNOSIS — R269 Unspecified abnormalities of gait and mobility: Secondary | ICD-10-CM | POA: Diagnosis not present

## 2015-06-16 ENCOUNTER — Ambulatory Visit: Payer: Medicare Other | Attending: Audiology | Admitting: Audiology

## 2015-06-30 ENCOUNTER — Emergency Department (HOSPITAL_COMMUNITY): Payer: Medicare Other

## 2015-06-30 ENCOUNTER — Encounter (HOSPITAL_COMMUNITY): Payer: Self-pay | Admitting: Emergency Medicine

## 2015-06-30 ENCOUNTER — Inpatient Hospital Stay (HOSPITAL_COMMUNITY)
Admission: EM | Admit: 2015-06-30 | Discharge: 2015-07-05 | DRG: 872 | Disposition: A | Discharge status: Home Health | Payer: Medicare Other | Attending: Internal Medicine | Admitting: Internal Medicine

## 2015-06-30 DIAGNOSIS — F0390 Unspecified dementia without behavioral disturbance: Secondary | ICD-10-CM | POA: Diagnosis present

## 2015-06-30 DIAGNOSIS — A4159 Other Gram-negative sepsis: Secondary | ICD-10-CM | POA: Diagnosis not present

## 2015-06-30 DIAGNOSIS — R609 Edema, unspecified: Secondary | ICD-10-CM | POA: Diagnosis not present

## 2015-06-30 DIAGNOSIS — I471 Supraventricular tachycardia: Secondary | ICD-10-CM | POA: Diagnosis not present

## 2015-06-30 DIAGNOSIS — R6520 Severe sepsis without septic shock: Secondary | ICD-10-CM | POA: Diagnosis present

## 2015-06-30 DIAGNOSIS — Z66 Do not resuscitate: Secondary | ICD-10-CM | POA: Diagnosis not present

## 2015-06-30 DIAGNOSIS — Z87891 Personal history of nicotine dependence: Secondary | ICD-10-CM | POA: Diagnosis not present

## 2015-06-30 DIAGNOSIS — D649 Anemia, unspecified: Secondary | ICD-10-CM | POA: Diagnosis not present

## 2015-06-30 DIAGNOSIS — B961 Klebsiella pneumoniae [K. pneumoniae] as the cause of diseases classified elsewhere: Secondary | ICD-10-CM | POA: Diagnosis not present

## 2015-06-30 DIAGNOSIS — Z7982 Long term (current) use of aspirin: Secondary | ICD-10-CM | POA: Diagnosis not present

## 2015-06-30 DIAGNOSIS — N2889 Other specified disorders of kidney and ureter: Secondary | ICD-10-CM | POA: Diagnosis not present

## 2015-06-30 DIAGNOSIS — R319 Hematuria, unspecified: Secondary | ICD-10-CM | POA: Diagnosis present

## 2015-06-30 DIAGNOSIS — H9190 Unspecified hearing loss, unspecified ear: Secondary | ICD-10-CM | POA: Diagnosis present

## 2015-06-30 DIAGNOSIS — N179 Acute kidney failure, unspecified: Secondary | ICD-10-CM | POA: Diagnosis present

## 2015-06-30 DIAGNOSIS — E872 Acidosis: Secondary | ICD-10-CM | POA: Diagnosis not present

## 2015-06-30 DIAGNOSIS — R509 Fever, unspecified: Secondary | ICD-10-CM | POA: Diagnosis present

## 2015-06-30 DIAGNOSIS — Z8249 Family history of ischemic heart disease and other diseases of the circulatory system: Secondary | ICD-10-CM

## 2015-06-30 DIAGNOSIS — E860 Dehydration: Secondary | ICD-10-CM | POA: Diagnosis not present

## 2015-06-30 DIAGNOSIS — N390 Urinary tract infection, site not specified: Secondary | ICD-10-CM | POA: Diagnosis present

## 2015-06-30 DIAGNOSIS — K8020 Calculus of gallbladder without cholecystitis without obstruction: Secondary | ICD-10-CM | POA: Diagnosis not present

## 2015-06-30 DIAGNOSIS — R0902 Hypoxemia: Secondary | ICD-10-CM | POA: Diagnosis present

## 2015-06-30 DIAGNOSIS — Z79899 Other long term (current) drug therapy: Secondary | ICD-10-CM | POA: Diagnosis not present

## 2015-06-30 DIAGNOSIS — I10 Essential (primary) hypertension: Secondary | ICD-10-CM | POA: Diagnosis present

## 2015-06-30 DIAGNOSIS — N1330 Unspecified hydronephrosis: Secondary | ICD-10-CM | POA: Diagnosis present

## 2015-06-30 DIAGNOSIS — C649 Malignant neoplasm of unspecified kidney, except renal pelvis: Secondary | ICD-10-CM | POA: Diagnosis present

## 2015-06-30 DIAGNOSIS — N135 Crossing vessel and stricture of ureter without hydronephrosis: Secondary | ICD-10-CM | POA: Diagnosis present

## 2015-06-30 DIAGNOSIS — R1110 Vomiting, unspecified: Secondary | ICD-10-CM | POA: Diagnosis not present

## 2015-06-30 DIAGNOSIS — R4182 Altered mental status, unspecified: Secondary | ICD-10-CM | POA: Diagnosis not present

## 2015-06-30 DIAGNOSIS — R002 Palpitations: Secondary | ICD-10-CM | POA: Diagnosis not present

## 2015-06-30 DIAGNOSIS — R1111 Vomiting without nausea: Secondary | ICD-10-CM | POA: Diagnosis not present

## 2015-06-30 DIAGNOSIS — R0682 Tachypnea, not elsewhere classified: Secondary | ICD-10-CM | POA: Diagnosis not present

## 2015-06-30 DIAGNOSIS — Z880 Allergy status to penicillin: Secondary | ICD-10-CM | POA: Diagnosis not present

## 2015-06-30 DIAGNOSIS — N201 Calculus of ureter: Secondary | ICD-10-CM | POA: Diagnosis not present

## 2015-06-30 DIAGNOSIS — M7989 Other specified soft tissue disorders: Secondary | ICD-10-CM | POA: Diagnosis not present

## 2015-06-30 DIAGNOSIS — A419 Sepsis, unspecified organism: Secondary | ICD-10-CM | POA: Diagnosis not present

## 2015-06-30 DIAGNOSIS — R7881 Bacteremia: Secondary | ICD-10-CM | POA: Diagnosis not present

## 2015-06-30 LAB — COMPREHENSIVE METABOLIC PANEL
ALBUMIN: 3.3 g/dL — AB (ref 3.5–5.0)
ALT: 14 U/L (ref 14–54)
AST: 25 U/L (ref 15–41)
Alkaline Phosphatase: 101 U/L (ref 38–126)
Anion gap: 12 (ref 5–15)
BILIRUBIN TOTAL: 1 mg/dL (ref 0.3–1.2)
BUN: 23 mg/dL — AB (ref 6–20)
CALCIUM: 8.1 mg/dL — AB (ref 8.9–10.3)
CO2: 22 mmol/L (ref 22–32)
Chloride: 108 mmol/L (ref 101–111)
Creatinine, Ser: 1.2 mg/dL — ABNORMAL HIGH (ref 0.44–1.00)
GFR calc Af Amer: 46 mL/min — ABNORMAL LOW (ref >60)
GFR calc non Af Amer: 40 mL/min — ABNORMAL LOW (ref >60)
GLUCOSE: 127 mg/dL — AB (ref 65–99)
Potassium: 3.1 mmol/L — ABNORMAL LOW (ref 3.5–5.1)
Sodium: 142 mmol/L (ref 135–145)
TOTAL PROTEIN: 5.9 g/dL — AB (ref 6.5–8.1)

## 2015-06-30 LAB — URINALYSIS, ROUTINE W REFLEX MICROSCOPIC
BILIRUBIN URINE: NEGATIVE
Glucose, UA: NEGATIVE mg/dL
KETONES UR: 15 mg/dL — AB
NITRITE: NEGATIVE
PH: 5.5 (ref 5.0–8.0)
Protein, ur: 30 mg/dL — AB
Specific Gravity, Urine: 1.017 (ref 1.005–1.030)

## 2015-06-30 LAB — CBC
HEMATOCRIT: 36.3 % (ref 36.0–46.0)
Hemoglobin: 11.8 g/dL — ABNORMAL LOW (ref 12.0–15.0)
MCH: 29.1 pg (ref 26.0–34.0)
MCHC: 32.5 g/dL (ref 30.0–36.0)
MCV: 89.6 fL (ref 78.0–100.0)
Platelets: 176 10*3/uL (ref 150–400)
RBC: 4.05 MIL/uL (ref 3.87–5.11)
RDW: 14.1 % (ref 11.5–15.5)
WBC: 12.9 10*3/uL — ABNORMAL HIGH (ref 4.0–10.5)

## 2015-06-30 LAB — I-STAT CG4 LACTIC ACID, ED: Lactic Acid, Venous: 6.1 mmol/L (ref 0.5–2.0)

## 2015-06-30 LAB — I-STAT TROPONIN, ED: Troponin i, poc: 0.05 ng/mL (ref 0.00–0.08)

## 2015-06-30 LAB — LIPASE, BLOOD: Lipase: 23 U/L (ref 11–51)

## 2015-06-30 LAB — URINE MICROSCOPIC-ADD ON: Squamous Epithelial / LPF: NONE SEEN

## 2015-06-30 MED ORDER — SODIUM CHLORIDE 0.9 % IV BOLUS (SEPSIS)
1000.0000 mL | Freq: Once | INTRAVENOUS | Status: AC
  Administered 2015-06-30: 1000 mL via INTRAVENOUS

## 2015-06-30 MED ORDER — DEXTROSE 5 % IV SOLN
2.0000 g | INTRAVENOUS | Status: AC
  Administered 2015-06-30: 2 g via INTRAVENOUS
  Filled 2015-06-30: qty 2

## 2015-06-30 MED ORDER — ONDANSETRON HCL 4 MG/2ML IJ SOLN
4.0000 mg | Freq: Once | INTRAMUSCULAR | Status: AC | PRN
  Administered 2015-06-30: 4 mg via INTRAVENOUS
  Filled 2015-06-30: qty 2

## 2015-06-30 MED ORDER — ACETAMINOPHEN 650 MG RE SUPP
650.0000 mg | Freq: Once | RECTAL | Status: AC
  Administered 2015-06-30: 650 mg via RECTAL
  Filled 2015-06-30: qty 1

## 2015-06-30 MED ORDER — DIATRIZOATE MEGLUMINE & SODIUM 66-10 % PO SOLN
15.0000 mL | Freq: Once | ORAL | Status: AC
  Administered 2015-06-30: 30 mL via ORAL

## 2015-06-30 MED ORDER — FENTANYL CITRATE (PF) 100 MCG/2ML IJ SOLN
25.0000 ug | Freq: Once | INTRAMUSCULAR | Status: AC
  Administered 2015-06-30: 25 ug via INTRAVENOUS
  Filled 2015-06-30: qty 2

## 2015-06-30 MED ORDER — IOPAMIDOL (ISOVUE-370) INJECTION 76%
100.0000 mL | Freq: Once | INTRAVENOUS | Status: AC | PRN
  Administered 2015-06-30: 100 mL via INTRAVENOUS

## 2015-06-30 MED ORDER — VANCOMYCIN HCL IN DEXTROSE 1-5 GM/200ML-% IV SOLN
1000.0000 mg | Freq: Once | INTRAVENOUS | Status: AC
  Administered 2015-06-30: 1000 mg via INTRAVENOUS
  Filled 2015-06-30: qty 200

## 2015-06-30 MED ORDER — SODIUM CHLORIDE 0.9 % IV BOLUS (SEPSIS): 1000.0000 mL | Freq: Once | INTRAVENOUS | Status: DC

## 2015-06-30 NOTE — ED Notes (Signed)
Katie Huerta [facility]): [phone]

## 2015-06-30 NOTE — ED Notes (Signed)
Bed: WA17 Expected date:  Expected time:  Means of arrival:  Comments: EMS - coffee ground emesis

## 2015-06-30 NOTE — ED Provider Notes (Addendum)
CSN: [id number]     Arrival date & time 06/30/15  1907 History   First MD Initiated Contact with Patient 06/30/15 1911     Chief Complaint  Patient presents with  . Emesis     (Consider location/radiation/quality/duration/timing/severity/associated sxs/prior Treatment) The history is provided by the patient, the spouse and a relative.  Katie Huerta is a 82 y.o. female hx of dementia, Ruptured appendix status post surgery here presenting with vomiting. Patient vomited some brownish liquid just prior to arrival. States that her abdomen is slightly distended. As per husband, patient had L flank pain today and subjective chills. Has hx of UTI and was noted to have blood in urine yesterday but has no dysuria. She was noted to be borderline hypoxic with oxygen 88% by EMS so placed on 2 L nasal cannula. Patient denies any chest pain or fevers. Patient demented and unable to give much history.    Level V caveat- dementia   Past Medical History  Diagnosis Date  . Syncope and collapse   . Memory loss   . Hearing difficulty    Past Surgical History  Procedure Laterality Date  . Appendectomy    . Vaginal hysterectomy     Family History  Problem Relation Age of Onset  . Coronary artery disease Father 59    Died 68s of an MI  . Coronary artery disease Brother 25  . Coronary artery disease Sister 15   Social History  Substance Use Topics  . Smoking status: Former Smoker    Quit date: 10/27/1955  . Smokeless tobacco: None  . Alcohol Use: No   OB History    No data available     Review of Systems  Gastrointestinal: Positive for vomiting.  All other systems reviewed and are negative.     Allergies  Penicillins  Home Medications   Prior to Admission medications   Medication Sig Start Date End Date Taking? Authorizing Provider  aspirin 81 MG tablet Take 81 mg by mouth daily.   Yes Historical Provider, MD  Calcium Carb-Cholecalciferol (CALCIUM-VITAMIN D3) 600-400 MG-UNIT  CAPS Take 2 tablets by mouth every morning. Patient taking differently: Take 1 tablet by mouth every morning.  05/08/13  Yes Deneise Lever, MD  Cyanocobalamin (B-12) 2000 MCG TABS Take 2,000 mcg by mouth daily. 03/30/15  Yes [name], MD  latanoprost (XALATAN) 0.005 % ophthalmic solution Place 1 drop into both eyes at bedtime.  09/17/12  Yes Historical Provider, MD  Multiple Vitamins-Minerals (ICAPS) CAPS Take 1 capsule by mouth 2 (two) times daily.   Yes Historical Provider, MD   BP 115/53 mmHg  Pulse 94  Temp(Src) 102.8 F (39.3 C) (Rectal)  Resp 26  Wt 130 lb 1.1 oz (59 kg)  SpO2 94% Physical Exam  Constitutional:  Chronically ill, demented   HENT:  Head: Normocephalic.  MM slightly dry   Eyes: Conjunctivae are normal. Pupils are equal, round, and reactive to light.  Neck: Normal range of motion. Neck supple.  Cardiovascular: Normal rate, regular rhythm and normal heart sounds.   Pulmonary/Chest: Effort normal and breath sounds normal. No respiratory distress. She has no wheezes. She has no rales.  Abdominal: Soft. Bowel sounds are normal.  + distended, nontender.   Musculoskeletal: Normal range of motion. She exhibits no edema or tenderness.  Neurological: She is alert.  Demented, moving all extremities   Skin: Skin is warm and dry.  Psychiatric:  Unable   Nursing note and vitals reviewed.  ED Course  Procedures (including critical care time)  CRITICAL CARE Performed by: Darl Householder, DAVID   Total critical care time: 30 minutes  Critical care time was exclusive of separately billable procedures and treating other patients.  Critical care was necessary to treat or prevent imminent or life-threatening deterioration.  Critical care was time spent personally by me on the following activities: development of treatment plan with patient and/or surrogate as well as nursing, discussions with consultants, evaluation of patient's response to treatment, examination of patient,  obtaining history from patient or surrogate, ordering and performing treatments and interventions, ordering and review of laboratory studies, ordering and review of radiographic studies, pulse oximetry and re-evaluation of patient's condition.   Labs Review Labs Reviewed  COMPREHENSIVE METABOLIC PANEL - Abnormal; Notable for the following:    Potassium 3.1 (*)    Glucose, Bld 127 (*)    BUN 23 (*)    Creatinine, Ser 1.20 (*)    Calcium 8.1 (*)    Total Protein 5.9 (*)    Albumin 3.3 (*)    GFR calc non Af Amer 40 (*)    GFR calc Af Amer 46 (*)    All other components within normal limits  CBC - Abnormal; Notable for the following:    WBC 12.9 (*)    Hemoglobin 11.8 (*)    All other components within normal limits  URINALYSIS, ROUTINE W REFLEX MICROSCOPIC (NOT AT [facility]) - Abnormal; Notable for the following:    Color, Urine RED (*)    APPearance TURBID (*)    Hgb urine dipstick LARGE (*)    Ketones, ur 15 (*)    Protein, ur 30 (*)    Leukocytes, UA TRACE (*)    All other components within normal limits  URINE MICROSCOPIC-ADD ON - Abnormal; Notable for the following:    Bacteria, UA RARE (*)    Casts HYALINE CASTS (*)    All other components within normal limits  I-STAT CG4 LACTIC ACID, ED - Abnormal; Notable for the following:    Lactic Acid, Venous 6.10 (*)    All other components within normal limits  CULTURE, BLOOD (ROUTINE X 2)  CULTURE, BLOOD (ROUTINE X 2)  URINE CULTURE  LIPASE, BLOOD  I-STAT TROPOININ, ED    Imaging Review Dg Chest 2 View  06/30/2015  CLINICAL DATA:  Vomiting and hypoxia today. EXAM: CHEST  2 VIEW COMPARISON:  None. FINDINGS: The mediastinal contour is normal. The heart size is enlarged. Both lungs are clear. There is elevation of the right hemidiaphragm. The visualized skeletal structures are unremarkable. IMPRESSION: No active cardiopulmonary disease. Electronically Signed   By: Abelardo Diesel M.D.   On: 06/30/2015 20:43   Ct Head Wo  Contrast  06/30/2015  CLINICAL DATA:  Altered mental status and vomiting. EXAM: CT HEAD WITHOUT CONTRAST TECHNIQUE: Contiguous axial images were obtained from the base of the skull through the vertex without intravenous contrast. COMPARISON:  None. FINDINGS: Diffuse cerebral atrophy. Mild ventricular dilatation consistent with central atrophy. Low-attenuation changes in the deep white matter are nonspecific but likely represent small vessel ischemic change. No mass effect or midline shift. No abnormal extra-axial fluid collections. Gray-white matter junctions are distinct. Basal cisterns are not effaced. No evidence of acute intracranial hemorrhage. No depressed skull fractures. Visualized paranasal sinuses and mastoid air cells are not opacified. Vascular calcifications. IMPRESSION: No acute intracranial abnormalities. Chronic atrophy and small vessel ischemic changes. Electronically Signed   By: Lucienne Capers M.D.   On: 06/30/2015  22:33   Ct Angio Chest/abd/pel For Dissection W And/or W/wo  06/30/2015  CLINICAL DATA:  Vomiting brown fluid. Evaluate for thoracic aortic dissection. EXAM: CT ANGIOGRAPHY CHEST, ABDOMEN AND PELVIS TECHNIQUE: Multidetector CT imaging through the chest, abdomen and pelvis was performed using the standard protocol during bolus administration of intravenous contrast. Multiplanar reconstructed images and MIPs were obtained and reviewed to evaluate the vascular anatomy. CONTRAST:  100 cc Isovue 370 COMPARISON:  None. FINDINGS: Vascular Findings of the chest: There is mild fusiform ectasia of the ascending thoracic aorta was measurements as follows, though note, evaluation is degraded secondary to significant pulsation artifact. No definite thoracic aortic dissection or periaortic stranding on this nongated examination. Review of the precontrast images are negative for the presence of an intramural hematoma. The thoracic aorta tapers to a normal caliber at the level of the aortic arch.  Conventional configuration of the aortic arch. The branch vessels of the aortic arch appear widely patent throughout their imaged course. Scattered atherosclerotic plaque within the descending thoracic aorta, not resulting in hemodynamically significant stenosis. Cardiomegaly.  No pericardial effusion. Although this examination was not tailored for the evaluation the pulmonary arteries, there are no discrete filling defects within the central pulmonary arterial tree to suggest central pulmonary embolism. Borderline enlarged caliber the main pulmonary artery measuring 32 mm in diameter ------------------------------------------------------------- Thoracic aortic measurements: Sinotubular junction 20 mm as measured in greatest oblique coronal dimension. Proximal ascending aorta 38 mm as measured in greatest oblique axial dimension at the level of the main pulmonary artery (image 39, series 8, an approximately 38 mm in greatest oblique coronal diameter (coronal image 61, series 11). Aortic arch aorta 27 mm as measured in greatest oblique sagittal dimension. Proximal descending thoracic aorta 24 mm as measured in greatest oblique axial dimension at the level of the main pulmonary artery. Distal descending thoracic aorta 21 mm as measured in greatest oblique axial dimension at the level of the diaphragmatic hiatus. Review of the MIP images confirms the above findings. ------------------------------------------------------------- Non-Vascular Findings of the chest: Evaluation of the pulmonary parenchyma is degraded secondary to patient respiratory artifact. Minimal dependent subpleural ground-glass atelectasis. No focal airspace opacities. No pleural effusion or pneumothorax. The central pulmonary airways are narrowed about the hilum though patent. No discrete pulmonary nodules given limitation of the examination. Solitary high a right paratracheal lymph node is not enlarged by size criteria measuring 0.8 cm in greatest  short axis diameter (image 17, series 8). No mediastinal, hilar axillary lymphadenopathy. No acute or aggressive osseous abnormalities within the chest. Regional soft tissues appear normal. The thyroid gland appears diminutive but otherwise normal. --------------------------------------------------------------- Vascular Findings of the abdomen and pelvis: Abdominal aorta: Moderate amount of mixed calcified and noncalcified atherosclerotic plaque within a normal caliber abdominal aorta, not resulting in hemodynamically significant stenosis. No abdominal aortic dissection or periaortic stranding. Celiac artery: There is a minimal amount of eccentric mixed calcified and noncalcified atherosclerotic plaque involving the origin of the celiac artery, not resulting in hemodynamically significant stenosis. Conventional branching pattern. SMA: There is a minimal amount of eccentric mixed calcified plaque involving the origin the SMA, not resulting in hemodynamically significant stenosis. Conventional branching pattern. The distal tributaries the SMA are widely patent without disc intraluminal filling defect suggest distal embolism. Right Renal artery: Solitary; widely patent without hemodynamically significant narrowing. No vessel irregularity to suggest FMD. Left Renal artery: Solitary; widely patent without hemodynamically significant narrowing. No vessel irregularity to suggest FMD. IMA: Widely patent without hemodynamically significant narrowing. Pelvic vasculature:  There is a minimal amount of eccentric mixed calcified and noncalcified atherosclerotic plaque within normal caliber bilateral common iliac arteries, not resulting in hemodynamically significant stenosis. The bilateral internal iliac arteries are mildly disease though patent of normal caliber. The bilateral external iliac arteries are widely patent without hemodynamically significant narrowing. Review of the MIP images confirms the above findings.  -------------------------------------------------------------------------------- Nonvascular Findings of the abdomen and pelvis: Evaluation of the abdominal organs is largely limited to the arterial phase of enhancement. Normal hepatic contour. There is diffuse decreased attenuation of the hepatic parenchyma on this postcontrast examination suggestive of hepatic steatosis. There is a punctate (approximately 1.7 cm) gallstone within neck of an otherwise normal-appearing gallbladder. No gallbladder wall thickening or pericholecystic stranding. No intra extrahepatic bili duct dilatation. No ascites. There is symmetric enhancement of the bilateral kidneys. There is rather abrupt tapered narrowing involving the mid/distal aspect of the right ureter (representative axial images 151 and 155, series 8, coronal images 65 and 68, series 11) with minimal amount of adjacent periureteral stranding and moderate upstream ureterectasis and pelvicaliectasis. The etiology of this tapered narrowing is not depicted on this examination, specifically, no renal stones however appearance is worrisome for a nonocclusive uroepithelial lesion. Note is made of an approximately 1.1 x 1.3 cm partially exophytic hyper enhancing nodule within the anterior interpolar aspect of the left kidney worrisome for a small renal cell carcinoma. Note is also made of an approximately 1.4 cm hypo attenuating (4 Hounsfield unit) left-sided renal cyst. No discrete right-sided renal lesions. There is mild thickening of the left adrenal gland without discrete nodule. Normal appearance of the right adrenal gland. Normal appearance of the pancreas and spleen. Rather extensive colonic diverticulosis without evidence of diverticulitis. The bowel is normal in course and caliber without wall thickening or evidence of enteric obstruction. Normal appearance of the terminal ileum. The appendix is not visualized, however there is no pericecal inflammatory change. No  pneumoperitoneum, pneumatosis or portal venous gas. No bulky retroperitoneal, mesenteric, pelvic or inguinal lymphadenopathy. Post hysterectomy. No discrete adnexal lesion. No free fluid in the pelvic cul-de-sac. No acute or aggressive osseous abnormalities. Mild bilateral facet degenerative change of the lower lumbar spine. Small mesenteric fat containing periumbilical hernia. IMPRESSION: Chest CTA Impression: 1. No acute cardiopulmonary disease. Specifically, no evidence of thoracic aortic dissection or central pulmonary embolism. 2. Mild fusiform ectasia of the ascending thoracic aorta measuring approximately 38 mm in diameter. 3. Cardiomegaly and enlargement of the caliber the main pulmonary artery, nonspecific though could be seen in the setting of pulmonary arterial hypertension. Abdomen and pelvis CTA Impression: 1. There is rather abrupt tapered narrowing/occlusion involving the mid/distal aspect of the right ureter with associated moderate upstream ureterectasis and pelvicaliectasis. While the exact etiology of this rather abrupt tapered narrowing is not depicted on this examination, findings is worrisome for a uroepithelial lesion/malignancy. Further evaluation with ureteroscopy could be performed as clinically indicated. 2. Incidentally noted approximately 1.3 cm partially exophytic hyper enhancing left-sided renal lesion worrisome for a renal cell carcinoma. 3. Extensive colonic diverticulosis without evidence of diverticulitis. 4. Moderate amount of mixed calcified and noncalcified atherosclerotic plaque within a normal caliber abdominal aorta, not resulting in hemodynamically significant stenosis. 5. Cholelithiasis without evidence cholecystitis. Electronically Signed   By: Sandi Mariscal M.D.   On: 06/30/2015 22:48   I have personally reviewed and evaluated these images and lab results as part of my medical decision-making.   EKG Interpretation   Date/Time:  Tuesday Jun 30 2015 21:08:16  EDT  Ventricular Rate:  99 PR Interval:  121 QRS Duration: 98 QT Interval:  320 QTC Calculation: 411 R Axis:   97 Text Interpretation:  Sinus rhythm Right axis deviation Low voltage,  extremity and precordial leads No previous ECGs available Confirmed by YAO   MD, DAVID ([id number]) on 06/30/2015 9:31:40 PM      MDM   Final diagnoses:  None    Katie Huerta is a 82 y.o. female here with vomiting, abdominal distention. Hx of hysterectomy and ruptured appy. Concerned for SBO vs aspiration pneumonia. Not hypoxic in the ED. Will get labs, CXR, ct ab/pel. Will hydrate and reassess.   9pm Patient had sudden onset chills and rigors and flank pain. Rectal temp 102.8 F. Had worsening flank pain as well. Concerned now for possible pyelo vs infected kidney stone vs aspiration pneumonia vs dissection. Will add on lactate, cultures, will get in and out cath for UA. Will get CT head, CT chest/ab/pel. She is moving her neck and I doubt meningitis. Became hypoxic during this episode and placed on 2 L [place].   10:53 PM CT head nl. Lactate 6.1. UA + RBC, trace leuk. CT showed no dissection but has L base pneumonia, R hydro likely from transitional cell carcinoma, no renal stone. Ordered vanc/ cefepime (PCN allergic). Called Dr. Hal Hope, hospitalist, who wants 30 cc/kg bolus and then repeat lactate to ensure clearing. Will admit to stepdown for now. Never hypotensive.     Wandra Arthurs, MD 06/30/15 2252  Wandra Arthurs, MD 06/30/15 [phone]

## 2015-06-30 NOTE — ED Notes (Signed)
Pt in CT.

## 2015-06-30 NOTE — ED Notes (Signed)
Per EMS pt vomiting brown smooth fluid. Pt has alzheimers at baseline. EMS administered 400 mL NS IV. Family member en route.

## 2015-06-30 NOTE — ED Notes (Signed)
5E nurse unavailable for report; will call back

## 2015-06-30 NOTE — ED Notes (Signed)
Pt in CT/XRay

## 2015-06-30 NOTE — ED Notes (Signed)
2W called for report; stated they were not yet ready

## 2015-06-30 NOTE — ED Notes (Signed)
Hospitalist at bedside 

## 2015-07-01 ENCOUNTER — Inpatient Hospital Stay (HOSPITAL_COMMUNITY): Payer: Medicare Other | Admitting: Anesthesiology

## 2015-07-01 ENCOUNTER — Encounter (HOSPITAL_COMMUNITY): Payer: Self-pay | Admitting: Registered Nurse

## 2015-07-01 ENCOUNTER — Encounter (HOSPITAL_COMMUNITY)
Admission: EM | Disposition: A | Discharge status: Home Health | Payer: Self-pay | Source: Home / Self Care | Attending: Internal Medicine

## 2015-07-01 DIAGNOSIS — N390 Urinary tract infection, site not specified: Secondary | ICD-10-CM

## 2015-07-01 DIAGNOSIS — R6520 Severe sepsis without septic shock: Secondary | ICD-10-CM

## 2015-07-01 DIAGNOSIS — A419 Sepsis, unspecified organism: Secondary | ICD-10-CM | POA: Diagnosis present

## 2015-07-01 HISTORY — PX: CYSTOSCOPY W/ URETERAL STENT PLACEMENT: SHX1429

## 2015-07-01 LAB — BLOOD CULTURE ID PANEL (REFLEXED)
Acinetobacter baumannii: NOT DETECTED
CANDIDA GLABRATA: NOT DETECTED
CANDIDA KRUSEI: NOT DETECTED
CANDIDA PARAPSILOSIS: NOT DETECTED
CANDIDA TROPICALIS: NOT DETECTED
Candida albicans: NOT DETECTED
Carbapenem resistance: NOT DETECTED
ESCHERICHIA COLI: NOT DETECTED
Enterobacter cloacae complex: NOT DETECTED
Enterobacteriaceae species: NOT DETECTED
Enterococcus species: NOT DETECTED
HAEMOPHILUS INFLUENZAE: NOT DETECTED
Klebsiella oxytoca: NOT DETECTED
Klebsiella pneumoniae: DETECTED — AB
LISTERIA MONOCYTOGENES: NOT DETECTED
METHICILLIN RESISTANCE: NOT DETECTED
Neisseria meningitidis: NOT DETECTED
PROTEUS SPECIES: NOT DETECTED
Pseudomonas aeruginosa: NOT DETECTED
SERRATIA MARCESCENS: NOT DETECTED
STREPTOCOCCUS PYOGENES: NOT DETECTED
Staphylococcus aureus (BCID): NOT DETECTED
Staphylococcus species: NOT DETECTED
Streptococcus agalactiae: NOT DETECTED
Streptococcus pneumoniae: NOT DETECTED
Streptococcus species: NOT DETECTED
Vancomycin resistance: NOT DETECTED

## 2015-07-01 LAB — LACTIC ACID, PLASMA
LACTIC ACID, VENOUS: 3.9 mmol/L — AB (ref 0.5–2.0)
LACTIC ACID, VENOUS: 4.9 mmol/L — AB (ref 0.5–2.0)

## 2015-07-01 LAB — COMPREHENSIVE METABOLIC PANEL
ALT: 16 U/L (ref 14–54)
AST: 32 U/L (ref 15–41)
Albumin: 2.4 g/dL — ABNORMAL LOW (ref 3.5–5.0)
Alkaline Phosphatase: 78 U/L (ref 38–126)
Anion gap: 9 (ref 5–15)
BUN: 20 mg/dL (ref 6–20)
CHLORIDE: 114 mmol/L — AB (ref 101–111)
CO2: 18 mmol/L — ABNORMAL LOW (ref 22–32)
CREATININE: 1.29 mg/dL — AB (ref 0.44–1.00)
Calcium: 7 mg/dL — ABNORMAL LOW (ref 8.9–10.3)
GFR, EST AFRICAN AMERICAN: 42 mL/min — AB (ref >60)
GFR, EST NON AFRICAN AMERICAN: 36 mL/min — AB (ref >60)
Glucose, Bld: 152 mg/dL — ABNORMAL HIGH (ref 65–99)
POTASSIUM: 3.5 mmol/L (ref 3.5–5.1)
Sodium: 141 mmol/L (ref 135–145)
Total Bilirubin: 1 mg/dL (ref 0.3–1.2)
Total Protein: 4.6 g/dL — ABNORMAL LOW (ref 6.5–8.1)

## 2015-07-01 LAB — CBC WITH DIFFERENTIAL/PLATELET
BASOS PCT: 0 %
Basophils Absolute: 0 10*3/uL (ref 0.0–0.1)
EOS PCT: 0 %
Eosinophils Absolute: 0 10*3/uL (ref 0.0–0.7)
HCT: 30.1 % — ABNORMAL LOW (ref 36.0–46.0)
Hemoglobin: 9.7 g/dL — ABNORMAL LOW (ref 12.0–15.0)
LYMPHS ABS: 0.3 10*3/uL — AB (ref 0.7–4.0)
Lymphocytes Relative: 2 %
MCH: 29.9 pg (ref 26.0–34.0)
MCHC: 32.2 g/dL (ref 30.0–36.0)
MCV: 92.9 fL (ref 78.0–100.0)
MONO ABS: 0.6 10*3/uL (ref 0.1–1.0)
Monocytes Relative: 4 %
Neutro Abs: 14.4 10*3/uL — ABNORMAL HIGH (ref 1.7–7.7)
Neutrophils Relative %: 94 %
PLATELETS: 129 10*3/uL — AB (ref 150–400)
RBC: 3.24 MIL/uL — ABNORMAL LOW (ref 3.87–5.11)
RDW: 14.7 % (ref 11.5–15.5)
WBC Morphology: INCREASED
WBC: 15.3 10*3/uL — AB (ref 4.0–10.5)

## 2015-07-01 LAB — GRAM STAIN

## 2015-07-01 LAB — APTT: APTT: 33 s (ref 24–37)

## 2015-07-01 LAB — PROCALCITONIN: PROCALCITONIN: 43.08 ng/mL

## 2015-07-01 LAB — MRSA PCR SCREENING: MRSA by PCR: NEGATIVE

## 2015-07-01 LAB — PROTIME-INR
INR: 1.32 (ref 0.00–1.49)
Prothrombin Time: 16 seconds — ABNORMAL HIGH (ref 11.6–15.2)

## 2015-07-01 SURGERY — CYSTOSCOPY, WITH RETROGRADE PYELOGRAM AND URETERAL STENT INSERTION
Anesthesia: General | Site: Ureter | Laterality: Right

## 2015-07-01 MED ORDER — SODIUM CHLORIDE 0.9 % IR SOLN
Status: DC | PRN
  Administered 2015-07-01: 3000 mL via INTRAVESICAL

## 2015-07-01 MED ORDER — SODIUM CHLORIDE 0.9 % IV SOLN
INTRAVENOUS | Status: DC
  Administered 2015-07-01 (×2): via INTRAVENOUS

## 2015-07-01 MED ORDER — FENTANYL CITRATE (PF) 100 MCG/2ML IJ SOLN
INTRAMUSCULAR | Status: AC
  Filled 2015-07-01: qty 2

## 2015-07-01 MED ORDER — LATANOPROST 0.005 % OP SOLN
1.0000 [drp] | Freq: Every day | OPHTHALMIC | Status: DC
  Administered 2015-07-01 – 2015-07-04 (×4): 1 [drp] via OPHTHALMIC
  Filled 2015-07-01 (×2): qty 2.5

## 2015-07-01 MED ORDER — PROPOFOL 10 MG/ML IV BOLUS
INTRAVENOUS | Status: AC
  Filled 2015-07-01: qty 20

## 2015-07-01 MED ORDER — IOPAMIDOL (ISOVUE-300) INJECTION 61%
INTRAVENOUS | Status: DC | PRN
  Administered 2015-07-01: 15 mL

## 2015-07-01 MED ORDER — FENTANYL CITRATE (PF) 100 MCG/2ML IJ SOLN: 25.0000 ug | INTRAMUSCULAR | Status: DC | PRN

## 2015-07-01 MED ORDER — SUCCINYLCHOLINE CHLORIDE 20 MG/ML IJ SOLN
INTRAMUSCULAR | Status: DC | PRN
  Administered 2015-07-01: 100 mg via INTRAVENOUS

## 2015-07-01 MED ORDER — FENTANYL CITRATE (PF) 100 MCG/2ML IJ SOLN
INTRAMUSCULAR | Status: DC | PRN
  Administered 2015-07-01: 25 ug via INTRAVENOUS

## 2015-07-01 MED ORDER — POTASSIUM CHLORIDE CRYS ER 20 MEQ PO TBCR
40.0000 meq | EXTENDED_RELEASE_TABLET | Freq: Once | ORAL | Status: AC
  Administered 2015-07-01: 40 meq via ORAL
  Filled 2015-07-01: qty 2

## 2015-07-01 MED ORDER — IOPAMIDOL (ISOVUE-300) INJECTION 61%
INTRAVENOUS | Status: AC
  Filled 2015-07-01: qty 50

## 2015-07-01 MED ORDER — PROPOFOL 10 MG/ML IV BOLUS
INTRAVENOUS | Status: DC | PRN
  Administered 2015-07-01: 100 mg via INTRAVENOUS

## 2015-07-01 MED ORDER — ONDANSETRON HCL 4 MG/2ML IJ SOLN
INTRAMUSCULAR | Status: DC | PRN
  Administered 2015-07-01: 4 mg via INTRAVENOUS

## 2015-07-01 MED ORDER — LIDOCAINE HCL (CARDIAC) 20 MG/ML IV SOLN
INTRAVENOUS | Status: DC | PRN
  Administered 2015-07-01: 80 mg via INTRAVENOUS

## 2015-07-01 MED ORDER — ONDANSETRON HCL 4 MG PO TABS: 4.0000 mg | ORAL_TABLET | Freq: Four times a day (QID) | ORAL | Status: DC | PRN

## 2015-07-01 MED ORDER — SODIUM CHLORIDE 0.9 % IV SOLN
Freq: Once | INTRAVENOUS | Status: AC
  Administered 2015-07-01: 02:00:00 via INTRAVENOUS

## 2015-07-01 MED ORDER — VANCOMYCIN HCL IN DEXTROSE 750-5 MG/150ML-% IV SOLN
750.0000 mg | INTRAVENOUS | Status: DC
  Filled 2015-07-01: qty 150

## 2015-07-01 MED ORDER — EPHEDRINE SULFATE 50 MG/ML IJ SOLN
INTRAMUSCULAR | Status: DC | PRN
  Administered 2015-07-01 (×2): 5 mg via INTRAVENOUS

## 2015-07-01 MED ORDER — LACTATED RINGERS IV SOLN
INTRAVENOUS | Status: DC | PRN
  Administered 2015-07-01: 05:00:00 via INTRAVENOUS

## 2015-07-01 MED ORDER — ACETAMINOPHEN 325 MG PO TABS: 650.0000 mg | ORAL_TABLET | Freq: Four times a day (QID) | ORAL | Status: DC | PRN

## 2015-07-01 MED ORDER — LEVOFLOXACIN IN D5W 750 MG/150ML IV SOLN
750.0000 mg | Freq: Once | INTRAVENOUS | Status: AC
  Administered 2015-07-01: 750 mg via INTRAVENOUS
  Filled 2015-07-01: qty 150

## 2015-07-01 MED ORDER — AZTREONAM 2 G IJ SOLR
2.0000 g | Freq: Once | INTRAMUSCULAR | Status: AC
  Administered 2015-07-01: 2 g via INTRAVENOUS
  Filled 2015-07-01: qty 2

## 2015-07-01 MED ORDER — ONDANSETRON HCL 4 MG/2ML IJ SOLN
INTRAMUSCULAR | Status: AC
  Filled 2015-07-01: qty 2

## 2015-07-01 MED ORDER — ONDANSETRON HCL 4 MG/2ML IJ SOLN: 4.0000 mg | Freq: Four times a day (QID) | INTRAMUSCULAR | Status: DC | PRN

## 2015-07-01 MED ORDER — VANCOMYCIN HCL IN DEXTROSE 1-5 GM/200ML-% IV SOLN: 1000.0000 mg | Freq: Once | INTRAVENOUS | Status: DC

## 2015-07-01 MED ORDER — ZOLPIDEM TARTRATE 5 MG PO TABS
5.0000 mg | ORAL_TABLET | Freq: Once | ORAL | Status: AC
  Administered 2015-07-02: 5 mg via ORAL
  Filled 2015-07-01: qty 1

## 2015-07-01 MED ORDER — LIDOCAINE HCL (CARDIAC) 20 MG/ML IV SOLN
INTRAVENOUS | Status: AC
  Filled 2015-07-01: qty 5

## 2015-07-01 MED ORDER — ACETAMINOPHEN 650 MG RE SUPP: 650.0000 mg | Freq: Four times a day (QID) | RECTAL | Status: DC | PRN

## 2015-07-01 MED ORDER — DEXTROSE 5 % IV SOLN
1.0000 g | Freq: Three times a day (TID) | INTRAVENOUS | Status: DC
  Administered 2015-07-01 – 2015-07-03 (×7): 1 g via INTRAVENOUS
  Filled 2015-07-01 (×11): qty 1

## 2015-07-01 MED ORDER — LEVOFLOXACIN IN D5W 750 MG/150ML IV SOLN: 750.0000 mg | INTRAVENOUS | Status: DC

## 2015-07-01 SURGICAL SUPPLY — 14 items
BAG URO CATCHER STRL LF (MISCELLANEOUS) ×3 IMPLANT
BASKET ZERO TIP NITINOL 2.4FR (BASKET) IMPLANT
BSKT STON RTRVL ZERO TP 2.4FR (BASKET)
CATH INTERMIT  6FR 70CM (CATHETERS) IMPLANT
CLOTH BEACON ORANGE TIMEOUT ST (SAFETY) ×3 IMPLANT
GLOVE BIOGEL M STRL SZ7.5 (GLOVE) ×3 IMPLANT
GOWN STRL REUS W/TWL LRG LVL3 (GOWN DISPOSABLE) ×6 IMPLANT
GUIDEWIRE ANG ZIPWIRE 038X150 (WIRE) IMPLANT
GUIDEWIRE STR DUAL SENSOR (WIRE) ×3 IMPLANT
MANIFOLD NEPTUNE II (INSTRUMENTS) ×3 IMPLANT
PACK CYSTO (CUSTOM PROCEDURE TRAY) ×3 IMPLANT
STENT URET 6FRX24 CONTOUR (STENTS) ×2 IMPLANT
TUBING CONNECTING 10 (TUBING) ×2 IMPLANT
TUBING CONNECTING 10' (TUBING) ×1

## 2015-07-01 NOTE — H&P (Signed)
History and Physical    Katie Huerta [id number] DOB: Dec 04, 1927 DOA: 06/30/2015  Referring MD/NP/PA: Cleophas Dunker. PCP: [name], MD  Outpatient Specialists: None.   Patient coming from: Home.  Chief Complaint: Fever and chills.  History obtained from patient's husband.  HPI: Katie Huerta is a 82 y.o. female with medical history significant of with history of dementia was brought to the ER after patient had upper onset of fever and chills and back pain. As per patient's husband patient was doing fine earlier in the day and later in the evening around 9 PM patient started developing fever and chills abruptly and patient was complaining of severe back pain in the lower part. Patient also had one episode of nausea and vomiting. Denied any chest pain shortness of breath productive cough or diarrhea. In the ER patient was found to be febrile and a CT angiogram of the chest abdomen and pelvis was done which shows features concerning for urothelial malignancy. There was abrupt narrowing of the right ureter and left kidney with shadowing exophytic lesion. Patient's lactic acid was around 6 and patient was given fluid bolus for sepsis. On my exam patient is following commands and at this time pain has resolved. As per patient's husband patient has been having hematuria last 2 days.  ED Course: Patient was started on fluid bolus for sepsis with empiric antibiotics.  Review of Systems: As per HPI otherwise 10 point review of systems negative.    Past Medical History  Diagnosis Date  . Syncope and collapse   . Memory loss   . Hearing difficulty     Past Surgical History  Procedure Laterality Date  . Appendectomy    . Vaginal hysterectomy       reports that she quit smoking about 59 years ago. She does not have any smokeless tobacco history on file. She reports that she does not drink alcohol. Her drug history is not on file.  Allergies  Allergen Reactions  . Penicillins      unknown    Family History  Problem Relation Age of Onset  . Coronary artery disease Father 92    Died 27s of an MI  . Coronary artery disease Brother 71  . Coronary artery disease Sister 33    Prior to Admission medications   Medication Sig Start Date End Date Taking? Authorizing Provider  aspirin 81 MG tablet Take 81 mg by mouth daily.   Yes Historical Provider, MD  Calcium Carb-Cholecalciferol (CALCIUM-VITAMIN D3) 600-400 MG-UNIT CAPS Take 2 tablets by mouth every morning. Patient taking differently: Take 1 tablet by mouth every morning.  05/08/13  Yes Deneise Lever, MD  Cyanocobalamin (B-12) 2000 MCG TABS Take 2,000 mcg by mouth daily. 03/30/15  Yes [name], MD  latanoprost (XALATAN) 0.005 % ophthalmic solution Place 1 drop into both eyes at bedtime.  09/17/12  Yes Historical Provider, MD  Multiple Vitamins-Minerals (ICAPS) CAPS Take 1 capsule by mouth 2 (two) times daily.   Yes Historical Provider, MD    Physical Exam: Filed Vitals:   06/30/15 2055 06/30/15 2115 06/30/15 2130 06/30/15 2158  BP:   115/53   Pulse:  93 94   Temp: 102.8 F (39.3 C)     TempSrc: Rectal     Resp:  26 26   Weight:    130 lb 1.1 oz (59 kg)  SpO2:  95% 94%       Constitutional: Appears normal. Filed Vitals:   06/30/15 2055  06/30/15 2115 06/30/15 2130 06/30/15 2158  BP:   115/53   Pulse:  93 94   Temp: 102.8 F (39.3 C)     TempSrc: Rectal     Resp:  26 26   Weight:    130 lb 1.1 oz (59 kg)  SpO2:  95% 94%    Eyes: Anicteric no pallor. ENMT: No discharge from the ears eyes nose or mouth. Neck: No neck rigidity no mass felt. Respiratory: No rhonchi or crepitations. Cardiovascular: S1-S2 heard. Abdomen: Soft nontender bowel sounds present. No guarding or rigidity. Musculoskeletal: No edema. Skin: No rash. Neurologic: Alert awake oriented to her name and follows commands and moves all extremities. Psychiatric: Appears normal has dementia.   Labs on Admission: I have  personally reviewed following labs and imaging studies  CBC:  Recent Labs Lab 06/30/15 1927  WBC 12.9*  HGB 11.8*  HCT 36.3  MCV 89.6  PLT 0000000   Basic Metabolic Panel:  Recent Labs Lab 06/30/15 1927  NA 142  K 3.1*  CL 108  CO2 22  GLUCOSE 127*  BUN 23*  CREATININE 1.20*  CALCIUM 8.1*   GFR: Estimated Creatinine Clearance: 28.5 mL/min (by C-G formula based on Cr of 1.2). Liver Function Tests:  Recent Labs Lab 06/30/15 1927  AST 25  ALT 14  ALKPHOS 101  BILITOT 1.0  PROT 5.9*  ALBUMIN 3.3*    Recent Labs Lab 06/30/15 1927  LIPASE 23   No results for input(s): AMMONIA in the last 168 hours. Coagulation Profile: No results for input(s): INR, PROTIME in the last 168 hours. Cardiac Enzymes: No results for input(s): CKTOTAL, CKMB, CKMBINDEX, TROPONINI in the last 168 hours. BNP (last 3 results) No results for input(s): PROBNP in the last 8760 hours. HbA1C: No results for input(s): HGBA1C in the last 72 hours. CBG: No results for input(s): GLUCAP in the last 168 hours. Lipid Profile: No results for input(s): CHOL, HDL, LDLCALC, TRIG, CHOLHDL, LDLDIRECT in the last 72 hours. Thyroid Function Tests: No results for input(s): TSH, T4TOTAL, FREET4, T3FREE, THYROIDAB in the last 72 hours. Anemia Panel: No results for input(s): VITAMINB12, FOLATE, FERRITIN, TIBC, IRON, RETICCTPCT in the last 72 hours. Urine analysis:    Component Value Date/Time   COLORURINE RED* 06/30/2015 2107   APPEARANCEUR TURBID* 06/30/2015 2107   LABSPEC 1.017 06/30/2015 2107   PHURINE 5.5 06/30/2015 2107   GLUCOSEU NEGATIVE 06/30/2015 2107   HGBUR LARGE* 06/30/2015 2107   BILIRUBINUR NEGATIVE 06/30/2015 2107   BILIRUBINUR negative 03/09/2015 1734   KETONESUR 15* 06/30/2015 2107   PROTEINUR 30* 06/30/2015 2107   PROTEINUR 4+ 03/09/2015 1734   UROBILINOGEN negative 03/09/2015 1734   NITRITE NEGATIVE 06/30/2015 2107   NITRITE positive 03/09/2015 1734   LEUKOCYTESUR TRACE*  06/30/2015 2107   Sepsis Labs: @LABRCNTIP (procalcitonin:4,lacticidven:4) )No results found for this or any previous visit (from the past 240 hour(s)).   Radiological Exams on Admission: Dg Chest 2 View  06/30/2015  CLINICAL DATA:  Vomiting and hypoxia today. EXAM: CHEST  2 VIEW COMPARISON:  None. FINDINGS: The mediastinal contour is normal. The heart size is enlarged. Both lungs are clear. There is elevation of the right hemidiaphragm. The visualized skeletal structures are unremarkable. IMPRESSION: No active cardiopulmonary disease. Electronically Signed   By: Abelardo Diesel M.D.   On: 06/30/2015 20:43   Ct Head Wo Contrast  06/30/2015  CLINICAL DATA:  Altered mental status and vomiting. EXAM: CT HEAD WITHOUT CONTRAST TECHNIQUE: Contiguous axial images were obtained from the base  of the skull through the vertex without intravenous contrast. COMPARISON:  None. FINDINGS: Diffuse cerebral atrophy. Mild ventricular dilatation consistent with central atrophy. Low-attenuation changes in the deep white matter are nonspecific but likely represent small vessel ischemic change. No mass effect or midline shift. No abnormal extra-axial fluid collections. Gray-white matter junctions are distinct. Basal cisterns are not effaced. No evidence of acute intracranial hemorrhage. No depressed skull fractures. Visualized paranasal sinuses and mastoid air cells are not opacified. Vascular calcifications. IMPRESSION: No acute intracranial abnormalities. Chronic atrophy and small vessel ischemic changes. Electronically Signed   By: Lucienne Capers M.D.   On: 06/30/2015 22:33   Ct Angio Chest/abd/pel For Dissection W And/or W/wo  06/30/2015  CLINICAL DATA:  Vomiting brown fluid. Evaluate for thoracic aortic dissection. EXAM: CT ANGIOGRAPHY CHEST, ABDOMEN AND PELVIS TECHNIQUE: Multidetector CT imaging through the chest, abdomen and pelvis was performed using the standard protocol during bolus administration of intravenous  contrast. Multiplanar reconstructed images and MIPs were obtained and reviewed to evaluate the vascular anatomy. CONTRAST:  100 cc Isovue 370 COMPARISON:  None. FINDINGS: Vascular Findings of the chest: There is mild fusiform ectasia of the ascending thoracic aorta was measurements as follows, though note, evaluation is degraded secondary to significant pulsation artifact. No definite thoracic aortic dissection or periaortic stranding on this nongated examination. Review of the precontrast images are negative for the presence of an intramural hematoma. The thoracic aorta tapers to a normal caliber at the level of the aortic arch. Conventional configuration of the aortic arch. The branch vessels of the aortic arch appear widely patent throughout their imaged course. Scattered atherosclerotic plaque within the descending thoracic aorta, not resulting in hemodynamically significant stenosis. Cardiomegaly.  No pericardial effusion. Although this examination was not tailored for the evaluation the pulmonary arteries, there are no discrete filling defects within the central pulmonary arterial tree to suggest central pulmonary embolism. Borderline enlarged caliber the main pulmonary artery measuring 32 mm in diameter ------------------------------------------------------------- Thoracic aortic measurements: Sinotubular junction 20 mm as measured in greatest oblique coronal dimension. Proximal ascending aorta 38 mm as measured in greatest oblique axial dimension at the level of the main pulmonary artery (image 39, series 8, an approximately 38 mm in greatest oblique coronal diameter (coronal image 61, series 11). Aortic arch aorta 27 mm as measured in greatest oblique sagittal dimension. Proximal descending thoracic aorta 24 mm as measured in greatest oblique axial dimension at the level of the main pulmonary artery. Distal descending thoracic aorta 21 mm as measured in greatest oblique axial dimension at the level of the  diaphragmatic hiatus. Review of the MIP images confirms the above findings. ------------------------------------------------------------- Non-Vascular Findings of the chest: Evaluation of the pulmonary parenchyma is degraded secondary to patient respiratory artifact. Minimal dependent subpleural ground-glass atelectasis. No focal airspace opacities. No pleural effusion or pneumothorax. The central pulmonary airways are narrowed about the hilum though patent. No discrete pulmonary nodules given limitation of the examination. Solitary high a right paratracheal lymph node is not enlarged by size criteria measuring 0.8 cm in greatest short axis diameter (image 17, series 8). No mediastinal, hilar axillary lymphadenopathy. No acute or aggressive osseous abnormalities within the chest. Regional soft tissues appear normal. The thyroid gland appears diminutive but otherwise normal. --------------------------------------------------------------- Vascular Findings of the abdomen and pelvis: Abdominal aorta: Moderate amount of mixed calcified and noncalcified atherosclerotic plaque within a normal caliber abdominal aorta, not resulting in hemodynamically significant stenosis. No abdominal aortic dissection or periaortic stranding. Celiac artery: There is a minimal  amount of eccentric mixed calcified and noncalcified atherosclerotic plaque involving the origin of the celiac artery, not resulting in hemodynamically significant stenosis. Conventional branching pattern. SMA: There is a minimal amount of eccentric mixed calcified plaque involving the origin the SMA, not resulting in hemodynamically significant stenosis. Conventional branching pattern. The distal tributaries the SMA are widely patent without disc intraluminal filling defect suggest distal embolism. Right Renal artery: Solitary; widely patent without hemodynamically significant narrowing. No vessel irregularity to suggest FMD. Left Renal artery: Solitary; widely  patent without hemodynamically significant narrowing. No vessel irregularity to suggest FMD. IMA: Widely patent without hemodynamically significant narrowing. Pelvic vasculature: There is a minimal amount of eccentric mixed calcified and noncalcified atherosclerotic plaque within normal caliber bilateral common iliac arteries, not resulting in hemodynamically significant stenosis. The bilateral internal iliac arteries are mildly disease though patent of normal caliber. The bilateral external iliac arteries are widely patent without hemodynamically significant narrowing. Review of the MIP images confirms the above findings. -------------------------------------------------------------------------------- Nonvascular Findings of the abdomen and pelvis: Evaluation of the abdominal organs is largely limited to the arterial phase of enhancement. Normal hepatic contour. There is diffuse decreased attenuation of the hepatic parenchyma on this postcontrast examination suggestive of hepatic steatosis. There is a punctate (approximately 1.7 cm) gallstone within neck of an otherwise normal-appearing gallbladder. No gallbladder wall thickening or pericholecystic stranding. No intra extrahepatic bili duct dilatation. No ascites. There is symmetric enhancement of the bilateral kidneys. There is rather abrupt tapered narrowing involving the mid/distal aspect of the right ureter (representative axial images 151 and 155, series 8, coronal images 65 and 68, series 11) with minimal amount of adjacent periureteral stranding and moderate upstream ureterectasis and pelvicaliectasis. The etiology of this tapered narrowing is not depicted on this examination, specifically, no renal stones however appearance is worrisome for a nonocclusive uroepithelial lesion. Note is made of an approximately 1.1 x 1.3 cm partially exophytic hyper enhancing nodule within the anterior interpolar aspect of the left kidney worrisome for a small renal cell  carcinoma. Note is also made of an approximately 1.4 cm hypo attenuating (4 Hounsfield unit) left-sided renal cyst. No discrete right-sided renal lesions. There is mild thickening of the left adrenal gland without discrete nodule. Normal appearance of the right adrenal gland. Normal appearance of the pancreas and spleen. Rather extensive colonic diverticulosis without evidence of diverticulitis. The bowel is normal in course and caliber without wall thickening or evidence of enteric obstruction. Normal appearance of the terminal ileum. The appendix is not visualized, however there is no pericecal inflammatory change. No pneumoperitoneum, pneumatosis or portal venous gas. No bulky retroperitoneal, mesenteric, pelvic or inguinal lymphadenopathy. Post hysterectomy. No discrete adnexal lesion. No free fluid in the pelvic cul-de-sac. No acute or aggressive osseous abnormalities. Mild bilateral facet degenerative change of the lower lumbar spine. Small mesenteric fat containing periumbilical hernia. IMPRESSION: Chest CTA Impression: 1. No acute cardiopulmonary disease. Specifically, no evidence of thoracic aortic dissection or central pulmonary embolism. 2. Mild fusiform ectasia of the ascending thoracic aorta measuring approximately 38 mm in diameter. 3. Cardiomegaly and enlargement of the caliber the main pulmonary artery, nonspecific though could be seen in the setting of pulmonary arterial hypertension. Abdomen and pelvis CTA Impression: 1. There is rather abrupt tapered narrowing/occlusion involving the mid/distal aspect of the right ureter with associated moderate upstream ureterectasis and pelvicaliectasis. While the exact etiology of this rather abrupt tapered narrowing is not depicted on this examination, findings is worrisome for a uroepithelial lesion/malignancy. Further evaluation with ureteroscopy could  be performed as clinically indicated. 2. Incidentally noted approximately 1.3 cm partially exophytic hyper  enhancing left-sided renal lesion worrisome for a renal cell carcinoma. 3. Extensive colonic diverticulosis without evidence of diverticulitis. 4. Moderate amount of mixed calcified and noncalcified atherosclerotic plaque within a normal caliber abdominal aorta, not resulting in hemodynamically significant stenosis. 5. Cholelithiasis without evidence cholecystitis. Electronically Signed   By: Sandi Mariscal M.D.   On: 06/30/2015 22:48     Assessment/Plan Principal Problem:   Severe sepsis ([place]) Active Problems:   Dementia   ARF (acute renal failure) ([facility])   Chronic anemia   Sepsis ([place])    #1. Sepsis probably from UTI - at this time I have placed patient on empiric antibiotics for sepsis secondary to UTI. Follow blood cultures lactic acid levels for calcitonin levels and continue with aggressive hydration. #2. Abrupt narrowing of the right ureter with left kidney showing some fatty lesion concerning for malignancy - I have discussed with on-call urologist Dr. Tresa Moore. Urologist will be seeing patient in consult. At this time patient is making urine. We will closely follow intake output and I have also requested Foley catheter placement for close follow-up of intake output. #3. Acute renal failure - probably from dehydration and nausea vomiting. Continue with aggressive hydration and closely follow metabolic panel. #4. Gallstones - CAT scan doesn't show any features of cholecystitis. LFTs unremarkable on exam abdomen appears benign. If nausea vomiting persist may need HIDA or sonogram of abdomen. I think at this time patient's symptoms are more from urinary source. #5. Chronic anemia - follow CBC. #6. Dementia  - no acute issues.   DVT prophylaxis: SCDs since patient has hematuria and also may need procedures. Code Status: DO NOT RESUSCITATE.  Family Communication: Patient's husband.  Disposition Plan: Home.  Consults called: Urologist Dr. Tresa Moore.  Admission status: Inpatient to stepdown. Likely  stay would be 3-4 days.    Rise Patience MD [facility] Pager [phone].  If 7PM-7AM, please contact night-coverage [email or web address] Password [email or web address]  07/01/2015, 12:00 AM

## 2015-07-01 NOTE — Anesthesia Procedure Notes (Signed)
Procedure Name: Intubation Date/Time: 07/01/2015 5:17 AM Performed by: Carleene Cooper A Pre-anesthesia Checklist: Patient identified, Emergency Drugs available, Suction available, Patient being monitored and Timeout performed Patient Re-evaluated:Patient Re-evaluated prior to inductionOxygen Delivery Method: Circle system utilized Preoxygenation: Pre-oxygenation with 100% oxygen Intubation Type: IV induction Ventilation: Mask ventilation without difficulty Laryngoscope Size: Mac and 3 Grade View: Grade I Tube type: Subglottic suction tube Tube size: 7.5 mm Number of attempts: 2 Airway Equipment and Method: Stylet Placement Confirmation: ETT inserted through vocal cords under direct vision,  positive ETCO2 and breath sounds checked- equal and bilateral Secured at: 21 cm Tube secured with: Tape Dental Injury: Teeth and Oropharynx as per pre-operative assessment

## 2015-07-01 NOTE — Progress Notes (Signed)
MD made aware of critical lactic acid result. New order given for 2 liters of fluids and to check lactic acid after fluid boluses. New order also received to place foley. Order carried out by this RN. Pt tolerated insertion of foley without no problems.  VWilliams, rn.

## 2015-07-01 NOTE — Progress Notes (Signed)
CRITICAL VALUE ALERT  Critical value received: lactic acid 4.9  Date of notification:  07/01/2015  Time of notification:  0121  Critical value read back:Yes.    Nurse who received alert:  Hale Bogus.  MD notified (1st page):  Dr. Hal Hope  Time of first page:  0123  MD notified (2nd page):  Time of second page:  Responding MD:  Dr. Hal Hope Time MD responded: [phone]

## 2015-07-01 NOTE — Consult Note (Signed)
Reason for Consult: Right Hydronephrosis / Suspect Ureteral Neoplasm, Small Left Solid Enhancing Renal Mass, Urosepsis  Referring Physician: Gean Birchwood MD  Katie Huerta is an 82 y.o. female.   HPI:   1 - Right Hydronephrosis / Suspect Ureteral Neoplasm - mod-severe right hydro to mid ureter / iliac crossing to level of enhancing soft tissue density concerning for ureteral neoplasm. No h/o nephrolithiasis right renal surgery. Remote smoker. Cr <1.5. She admits to hematuria for few weeks. 1 artery 2 vein right renovascular anatomy.   2 - Small Left Solid Enhancing Renal Mass - 1.3 cm left anterior mid 50% exophytic mass incidental by ER CT 06/2015. 1 artery / 1 vein left renovascular anatomy. No additional cortical lesions.   3 -  Urosepsis  - Pt with fevers, tachycardia, lactic acidosis and bacteruria / pyuria consistent with likely urosepsis.  BCX, UCX 5/3 pending. Placed on empiric Aztreonam, Levaquin.   PMH sig for appy, benign hyst, dementia. Her PCP is Dr. Laurance Flatten in [place]. No blood thinners or symptomatic CV disease. She and her husband [name] live independently.   Today "Ronita" is seen in consultation for above.   Past Medical History  Diagnosis Date  . Syncope and collapse   . Memory loss   . Hearing difficulty     Past Surgical History  Procedure Laterality Date  . Appendectomy    . Vaginal hysterectomy      Family History  Problem Relation Age of Onset  . Coronary artery disease Father 60    Died 56s of an MI  . Coronary artery disease Brother 43  . Coronary artery disease Sister 92    Social History:  reports that she quit smoking about 59 years ago. She does not have any smokeless tobacco history on file. She reports that she does not drink alcohol. Her drug history is not on file.  Allergies:  Allergies  Allergen Reactions  . Penicillins     unknown    Medications: I have reviewed the patient's current medications.    Dg Chest 2  View  06/30/2015  CLINICAL DATA:  Vomiting and hypoxia today. EXAM: CHEST  2 VIEW COMPARISON:  None. FINDINGS: The mediastinal contour is normal. The heart size is enlarged. Both lungs are clear. There is elevation of the right hemidiaphragm. The visualized skeletal structures are unremarkable. IMPRESSION: No active cardiopulmonary disease. Electronically Signed   By: Abelardo Diesel M.D.   On: 06/30/2015 20:43   Ct Head Wo Contrast  06/30/2015  CLINICAL DATA:  Altered mental status and vomiting. EXAM: CT HEAD WITHOUT CONTRAST TECHNIQUE: Contiguous axial images were obtained from the base of the skull through the vertex without intravenous contrast. COMPARISON:  None. FINDINGS: Diffuse cerebral atrophy. Mild ventricular dilatation consistent with central atrophy. Low-attenuation changes in the deep white matter are nonspecific but likely represent small vessel ischemic change. No mass effect or midline shift. No abnormal extra-axial fluid collections. Gray-white matter junctions are distinct. Basal cisterns are not effaced. No evidence of acute intracranial hemorrhage. No depressed skull fractures. Visualized paranasal sinuses and mastoid air cells are not opacified. Vascular calcifications. IMPRESSION: No acute intracranial abnormalities. Chronic atrophy and small vessel ischemic changes. Electronically Signed   By: Lucienne Capers M.D.   On: 06/30/2015 22:33   Ct Angio Chest/abd/pel For Dissection W And/or W/wo  06/30/2015  CLINICAL DATA:  Vomiting brown fluid. Evaluate for thoracic aortic dissection. EXAM: CT ANGIOGRAPHY CHEST, ABDOMEN AND PELVIS TECHNIQUE: Multidetector CT imaging through the chest, abdomen and  pelvis was performed using the standard protocol during bolus administration of intravenous contrast. Multiplanar reconstructed images and MIPs were obtained and reviewed to evaluate the vascular anatomy. CONTRAST:  100 cc Isovue 370 COMPARISON:  None. FINDINGS: Vascular Findings of the chest: There is  mild fusiform ectasia of the ascending thoracic aorta was measurements as follows, though note, evaluation is degraded secondary to significant pulsation artifact. No definite thoracic aortic dissection or periaortic stranding on this nongated examination. Review of the precontrast images are negative for the presence of an intramural hematoma. The thoracic aorta tapers to a normal caliber at the level of the aortic arch. Conventional configuration of the aortic arch. The branch vessels of the aortic arch appear widely patent throughout their imaged course. Scattered atherosclerotic plaque within the descending thoracic aorta, not resulting in hemodynamically significant stenosis. Cardiomegaly.  No pericardial effusion. Although this examination was not tailored for the evaluation the pulmonary arteries, there are no discrete filling defects within the central pulmonary arterial tree to suggest central pulmonary embolism. Borderline enlarged caliber the main pulmonary artery measuring 32 mm in diameter ------------------------------------------------------------- Thoracic aortic measurements: Sinotubular junction 20 mm as measured in greatest oblique coronal dimension. Proximal ascending aorta 38 mm as measured in greatest oblique axial dimension at the level of the main pulmonary artery (image 39, series 8, an approximately 38 mm in greatest oblique coronal diameter (coronal image 61, series 11). Aortic arch aorta 27 mm as measured in greatest oblique sagittal dimension. Proximal descending thoracic aorta 24 mm as measured in greatest oblique axial dimension at the level of the main pulmonary artery. Distal descending thoracic aorta 21 mm as measured in greatest oblique axial dimension at the level of the diaphragmatic hiatus. Review of the MIP images confirms the above findings. ------------------------------------------------------------- Non-Vascular Findings of the chest: Evaluation of the pulmonary parenchyma  is degraded secondary to patient respiratory artifact. Minimal dependent subpleural ground-glass atelectasis. No focal airspace opacities. No pleural effusion or pneumothorax. The central pulmonary airways are narrowed about the hilum though patent. No discrete pulmonary nodules given limitation of the examination. Solitary high a right paratracheal lymph node is not enlarged by size criteria measuring 0.8 cm in greatest short axis diameter (image 17, series 8). No mediastinal, hilar axillary lymphadenopathy. No acute or aggressive osseous abnormalities within the chest. Regional soft tissues appear normal. The thyroid gland appears diminutive but otherwise normal. --------------------------------------------------------------- Vascular Findings of the abdomen and pelvis: Abdominal aorta: Moderate amount of mixed calcified and noncalcified atherosclerotic plaque within a normal caliber abdominal aorta, not resulting in hemodynamically significant stenosis. No abdominal aortic dissection or periaortic stranding. Celiac artery: There is a minimal amount of eccentric mixed calcified and noncalcified atherosclerotic plaque involving the origin of the celiac artery, not resulting in hemodynamically significant stenosis. Conventional branching pattern. SMA: There is a minimal amount of eccentric mixed calcified plaque involving the origin the SMA, not resulting in hemodynamically significant stenosis. Conventional branching pattern. The distal tributaries the SMA are widely patent without disc intraluminal filling defect suggest distal embolism. Right Renal artery: Solitary; widely patent without hemodynamically significant narrowing. No vessel irregularity to suggest FMD. Left Renal artery: Solitary; widely patent without hemodynamically significant narrowing. No vessel irregularity to suggest FMD. IMA: Widely patent without hemodynamically significant narrowing. Pelvic vasculature: There is a minimal amount of eccentric  mixed calcified and noncalcified atherosclerotic plaque within normal caliber bilateral common iliac arteries, not resulting in hemodynamically significant stenosis. The bilateral internal iliac arteries are mildly disease though patent of normal caliber. The  bilateral external iliac arteries are widely patent without hemodynamically significant narrowing. Review of the MIP images confirms the above findings. -------------------------------------------------------------------------------- Nonvascular Findings of the abdomen and pelvis: Evaluation of the abdominal organs is largely limited to the arterial phase of enhancement. Normal hepatic contour. There is diffuse decreased attenuation of the hepatic parenchyma on this postcontrast examination suggestive of hepatic steatosis. There is a punctate (approximately 1.7 cm) gallstone within neck of an otherwise normal-appearing gallbladder. No gallbladder wall thickening or pericholecystic stranding. No intra extrahepatic bili duct dilatation. No ascites. There is symmetric enhancement of the bilateral kidneys. There is rather abrupt tapered narrowing involving the mid/distal aspect of the right ureter (representative axial images 151 and 155, series 8, coronal images 65 and 68, series 11) with minimal amount of adjacent periureteral stranding and moderate upstream ureterectasis and pelvicaliectasis. The etiology of this tapered narrowing is not depicted on this examination, specifically, no renal stones however appearance is worrisome for a nonocclusive uroepithelial lesion. Note is made of an approximately 1.1 x 1.3 cm partially exophytic hyper enhancing nodule within the anterior interpolar aspect of the left kidney worrisome for a small renal cell carcinoma. Note is also made of an approximately 1.4 cm hypo attenuating (4 Hounsfield unit) left-sided renal cyst. No discrete right-sided renal lesions. There is mild thickening of the left adrenal gland without discrete  nodule. Normal appearance of the right adrenal gland. Normal appearance of the pancreas and spleen. Rather extensive colonic diverticulosis without evidence of diverticulitis. The bowel is normal in course and caliber without wall thickening or evidence of enteric obstruction. Normal appearance of the terminal ileum. The appendix is not visualized, however there is no pericecal inflammatory change. No pneumoperitoneum, pneumatosis or portal venous gas. No bulky retroperitoneal, mesenteric, pelvic or inguinal lymphadenopathy. Post hysterectomy. No discrete adnexal lesion. No free fluid in the pelvic cul-de-sac. No acute or aggressive osseous abnormalities. Mild bilateral facet degenerative change of the lower lumbar spine. Small mesenteric fat containing periumbilical hernia. IMPRESSION: Chest CTA Impression: 1. No acute cardiopulmonary disease. Specifically, no evidence of thoracic aortic dissection or central pulmonary embolism. 2. Mild fusiform ectasia of the ascending thoracic aorta measuring approximately 38 mm in diameter. 3. Cardiomegaly and enlargement of the caliber the main pulmonary artery, nonspecific though could be seen in the setting of pulmonary arterial hypertension. Abdomen and pelvis CTA Impression: 1. There is rather abrupt tapered narrowing/occlusion involving the mid/distal aspect of the right ureter with associated moderate upstream ureterectasis and pelvicaliectasis. While the exact etiology of this rather abrupt tapered narrowing is not depicted on this examination, findings is worrisome for a uroepithelial lesion/malignancy. Further evaluation with ureteroscopy could be performed as clinically indicated. 2. Incidentally noted approximately 1.3 cm partially exophytic hyper enhancing left-sided renal lesion worrisome for a renal cell carcinoma. 3. Extensive colonic diverticulosis without evidence of diverticulitis. 4. Moderate amount of mixed calcified and noncalcified atherosclerotic plaque  within a normal caliber abdominal aorta, not resulting in hemodynamically significant stenosis. 5. Cholelithiasis without evidence cholecystitis. Electronically Signed   By: Sandi Mariscal M.D.   On: 06/30/2015 22:48    Review of Systems  Constitutional: Positive for fever and malaise/fatigue.  HENT: Negative.   Eyes: Negative.   Respiratory: Negative.   Cardiovascular: Negative.   Gastrointestinal: Negative.   Genitourinary: Positive for hematuria. Negative for flank pain.  Musculoskeletal: Negative.   Skin: Negative.   Neurological: Negative.   Endo/Heme/Allergies: Negative.   Psychiatric/Behavioral: Negative.    Blood pressure 115/53, pulse 94, temperature 98.4 F (36.9 C),  temperature source Oral, resp. rate 26, height 5\' 3"  (1.6 m), weight 62.6 kg (138 lb 0.1 oz), SpO2 94 %. Physical Exam  Constitutional: She appears well-developed.  HENT:  Head: Normocephalic.  Hard of hearing  Neck: Normal range of motion.  Cardiovascular:  Regular tachycardia  Respiratory: Effort normal.  GI: Soft.  Genitourinary:  No CVAT  Musculoskeletal: Normal range of motion.  Neurological: She is alert.  Stigmata of mild-moderate dementia, but AOx3  Skin: Skin is warm.  Psychiatric: She has a normal mood and affect. Her behavior is normal. Judgment and thought content normal.    Assessment/Plan:  1 - Right Hydronephrosis / Suspect Ureteral Neoplasm - very concernig for right ureteral cancer. Rec cysto / right retrograde / ureteral stent placement with goal of renal decompression then will need ureteroscopy / biopsy in elective setting once clears infectious parameters. DDX benign and malignant etiologies discussed.   Risks, benefits, alternatives (non-treatment, neph tube) discussed as well as peri-op course. Pt does have some moderate dementia but voiced understanding of above. Multiple attempts to call family (husband [name]) at [phone] over 2 hour period with only busy signal. No other  contact numbers available.   2 - Small Left Solid Enhancing Renal Mass - likely small localized renal cell carcinoma. Given age and possibility of right urothelial neoplasm (much more aggressive malignancy) favor surveillance of this, consider intervention only if becomes >2.5-3cm.   3 -  Urosepsis  - agree with IV hydration, current ABX pending further CX data    [place], Miarose Lippert 07/01/2015, 3:30 AM

## 2015-07-01 NOTE — Progress Notes (Addendum)
Patient seen and examined, stable, demented elderly female, NAD, oriented to person only, pleasant, following commands, s/p  right ureter stent placement, intraluminal neoplasm? Continue aztreonam and levaquin for empirical coverage for pseudomona, urine culture pending, h/o cefepime allergy? Replace k,  Repeat cbc/bmp in am.  Family updated ( husband and son in room). Patient is from SNF, will need Pt/Ot prior to discharge.

## 2015-07-01 NOTE — Anesthesia Postprocedure Evaluation (Signed)
Anesthesia Post Note  Patient: Katie Huerta  Procedure(s) Performed: Procedure(s) (LRB): CYSTOSCOPY WITH RETROGRADE PYELOGRAM/URETERAL STENT PLACEMENT (Right)  Patient location during evaluation: PACU Anesthesia Type: General Level of consciousness: awake and alert Pain management: pain level controlled Vital Signs Assessment: post-procedure vital signs reviewed and stable Respiratory status: spontaneous breathing, nonlabored ventilation, respiratory function stable and patient connected to nasal cannula oxygen Cardiovascular status: blood pressure returned to baseline and stable Postop Assessment: no signs of nausea or vomiting Anesthetic complications: no    Last Vitals:  Filed Vitals:   07/01/15 0545 07/01/15 0615  BP: 106/57 104/72  Pulse: 92 89  Temp: 37 C   Resp: 23 22    Last Pain:  Filed Vitals:   07/01/15 0618  PainSc: 0-No pain                 [name] L

## 2015-07-01 NOTE — Anesthesia Preprocedure Evaluation (Addendum)
Anesthesia Evaluation  Patient identified by MRN, date of birth, ID band Patient awake    Reviewed: Allergy & Precautions, H&P , NPO status , Patient's Chart, lab work & pertinent test results  Airway Mallampati: II  TM Distance: >3 FB Neck ROM: full    Dental no notable dental hx. (+) Dental Advisory Given, Teeth Intact   Pulmonary neg pulmonary ROS, former smoker,    Pulmonary exam normal breath sounds clear to auscultation       Cardiovascular Exercise Tolerance: Good negative cardio ROS Normal cardiovascular exam Rhythm:regular Rate:Normal  syncope   Neuro/Psych dementia negative neurological ROS  negative psych ROS   GI/Hepatic negative GI ROS, Neg liver ROS,   Endo/Other  negative endocrine ROS  Renal/GU ARFRenal diseasenegative Renal ROS  negative genitourinary   Musculoskeletal   Abdominal   Peds  Hematology negative hematology ROS (+)   Anesthesia Other Findings sepsis  Reproductive/Obstetrics negative OB ROS                            Anesthesia Physical Anesthesia Plan  ASA: IV and emergent  Anesthesia Plan: General   Post-op Pain Management:    Induction: Intravenous  Airway Management Planned: Oral ETT  Additional Equipment:   Intra-op Plan:   Post-operative Plan: Extubation in OR  Informed Consent: I have reviewed the patients History and Physical, chart, labs and discussed the procedure including the risks, benefits and alternatives for the proposed anesthesia with the patient or authorized representative who has indicated his/her understanding and acceptance.   Dental Advisory Given  Plan Discussed with: CRNA and Surgeon  Anesthesia Plan Comments:         Anesthesia Quick Evaluation

## 2015-07-01 NOTE — Transfer of Care (Signed)
Immediate Anesthesia Transfer of Care Note  Patient: Katie Huerta  Procedure(s) Performed: Procedure(s): CYSTOSCOPY WITH RETROGRADE PYELOGRAM/URETERAL STENT PLACEMENT (Right)  Patient Location: PACU  Anesthesia Type:General  Level of Consciousness: awake, alert , oriented and patient cooperative  Airway & Oxygen Therapy: Patient Spontanous Breathing and Patient connected to face mask oxygen  Post-op Assessment: Report given to RN, Post -op Vital signs reviewed and stable and Patient moving all extremities  Post vital signs: Reviewed and stable  Last Vitals:  Filed Vitals:   07/01/15 0037 07/01/15 0400  BP:    Pulse:    Temp: 36.9 C 36.9 C  Resp:      Last Pain: There were no vitals filed for this visit.       Complications: No apparent anesthesia complications

## 2015-07-01 NOTE — Op Note (Signed)
NAME:  Katie Huerta, Katie Huerta NO.:  [id number]  MEDICAL RECORD NO.:  [id number]  LOCATION:  [facility]                         FACILITY:  [facility]  PHYSICIAN:  Alexis Frock, MD     DATE OF BIRTH:  1927-09-23  DATE OF PROCEDURE:  07/01/2015                              OPERATIVE REPORT  DIAGNOSIS:  Right ureteral mass with hydronephrosis and urosepsis.  PROCEDURE: 1. Cystoscopy with right retrograde pyelogram interpretation. 2. Right ureteral stent placement 6 x 24, Contour, no tether.  ESTIMATED BLOOD LOSS:  Nil.  COMPLICATIONS:  None.  SPECIMENS:  Right renal pelvis urine for Gram-stain and culture.  FINDINGS: 1. Unremarkable urinary bladder, mild changes of cystitis. 2. Large multifocal filling defects in the right mid distal ureter     consistent with likely intraluminal neoplasm. 3. Successful placement of right ureteral stent, proximal end renal     pelvis and distal in urinary bladder with efflux of copious bloody     appearing urine around and through the distal end of the stent     corroborating decompression.  INDICATION:  Katie Huerta is an 82 year old lady, with history of hysterectomy and moderate dementia, who was found on workup of malaise to have severe right hydronephrosis with likely right anterior renal mass, small left renal cortical mass, and urosepsis.  Given her infectious parameters and signs of right obstruction, it was felt that urgent renal decompression was warranted.  Options were discussed including nephrostomy versus stenting.  The patient was proceed with stenting.  Despite her moderate dementia, she voiced understanding of the nature of the procedure and the perioperative course, but did not have sufficient capacity to sign her consent.  Multiple attempts were made to contact family at all contact numbers, this was not successful, therefore, emergent consent obtained.  PROCEDURE IN DETAIL:  The patient being Katie Huerta, was  verified. Procedure being cysto and right stent placement was confirmed. Procedure was carried out.  Time-out was performed.  Intravenous antibiotics were administered.  General endotracheal anesthesia was introduced.  The patient was placed into a low lithotomy position and sterile field was created by prepping and draping the patient's vagina, introitus, and proximal thighs using iodine x3.  Next, cystourethroscopy was performed using a 23-French rigid cystoscope with 30-degree offset lens.  Inspection of the urinary bladder revealed some mild cystitis changes, but no papillary masses or concerning erythema.  Ureteral orifices appeared singleton bilaterally.  The right ureteral orifice was cannulated with a 6-French end-hole catheter and right retrograde pyelogram was obtained.  Right retrograde pyelogram demonstrated a single right ureter with single-system right kidney.  There was moderate-to-severe hydroureteronephrosis to a multilevel filling defect in the mid-to- distal ureter concerning for possible intraluminal neoplasm, a 0.03 zip wire was advanced to the upper pole, and the open-ended catheter was advanced to this level, and a sample of renal pelvis fluid was obtained, and set aside for Gram-stain and culture.  This appeared to be very dark consistent with likely old bleeding, it was not grossly purulent.  The open-ended catheter was exchanged for a new 6 x 24 Contour-type stent over the zip wire using continuous fluoroscopic guidance.  Good proximal distal deployment were noted.  Efflux of urine  was seen around into the distal end of the stent.  Bladder was emptied per cystoscope.  A 16- French Foley catheter was placed per urethra to straight drain.  A 10 mL of sterile water in the balloon.  The procedure was terminated.  The patient tolerated the procedure well.  There were no immediate periprocedural complications.  The patient was taken to the postanesthesia care in  stable condition.          ______________________________ Alexis Frock, MD     TM/MEDQ  D:  07/01/2015  T:  07/01/2015  Job:  [id number]

## 2015-07-01 NOTE — Care Management Note (Signed)
Case Management Note  Patient Details  Name: Katie Huerta MRN: [id number] Date of Birth: Sep 16, 1927  Subjective/Objective:            sepsis        Action/Plan:Date:  Jul 01, 2015 Chart reviewed for concurrent status and case management needs. Will continue to follow patient for changes and needs: [name], BSN, [place], [place]   Expected Discharge Date:   (unknown)               Expected Discharge Plan:  Home/Self Care  In-House Referral:     Discharge planning Services     Post Acute Care Choice:    Choice offered to:     DME Arranged:    DME Agency:     HH Arranged:    [place] Agency:     Status of Service:  In process, will continue to follow  Medicare Important Message Given:    Date Medicare IM Given:    Medicare IM give by:    Date Additional Medicare IM Given:    Additional Medicare Important Message give by:     If discussed at [place] of Stay Meetings, dates discussed:    Additional Comments:  Leeroy Cha, RN 07/01/2015, 10:40 AM

## 2015-07-01 NOTE — Progress Notes (Signed)
Pharmacy Antibiotic Note  Katie Huerta is a 82 y.o. female admitted on 06/30/2015 with sepsis and UTI s/p R ureter stent placement.  Pharmacy was initially consulted for vancomycin, Levaquin, and Aztreonam dosing.  Vanc has already been d/c.  Rapid Blood culture results show Klebsiella.  Plan:  BCID results were discussed with Dr. Erlinda Hong and the patient's antibiotics will be changed to Aztreonam alone. Further narrowing will be determined based on finalized susceptibilities.  Continue Aztreonam 1g IV q8h based on renal function.  Follow up renal fxn, culture results, and clinical course.  Height: 5\' 3"  (160 cm) Weight: 138 lb 0.1 oz (62.6 kg) IBW/kg (Calculated) : 52.4  Temp (24hrs), Avg:99.2 F (37.3 C), Min:98.4 F (36.9 C), Max:102.8 F (39.3 C)   Recent Labs Lab 06/30/15 1927 06/30/15 2135 07/01/15 0037 07/01/15 0304  WBC 12.9*  --   --  15.3*  CREATININE 1.20*  --   --  1.29*  LATICACIDVEN  --  6.10* 4.9* 3.9*    Estimated Creatinine Clearance: 25.9 mL/min (by C-G formula based on Cr of 1.29).    Allergies  Allergen Reactions  . Penicillins     unknown   Antimicrobials this admission: Vancomycin 5/2 >> 5/3 Levofloxacin 5/3 >> 5/3 Aztreonam 5/3 >>  Dose adjustments this admission:  Microbiology results: 5/2 BCx: Klebsiella pneumoniae (carbapenem resistance not detected) 5/2 UCx (cath): sent  5/3 MRSA PCR: neg 5/3 R kidney: 5/3 L kidney:  Thank you for allowing pharmacy to be a part of this patient's care.  Gretta Arab PharmD, BCPS Pager [phone] 07/01/2015 4:04 PM

## 2015-07-01 NOTE — Brief Op Note (Signed)
06/30/2015 - 07/01/2015  5:34 AM  PATIENT:  Katie Huerta  82 y.o. female  PRE-OPERATIVE DIAGNOSIS:  Right Ureteral Mass  POST-OPERATIVE DIAGNOSIS:  right ureteral Mass  PROCEDURE:  Procedure(s): CYSTOSCOPY WITH RETROGRADE PYELOGRAM/URETERAL STENT PLACEMENT (Right)  SURGEON:  Surgeon(s) and Role:    * Alexis Frock, MD - Primary  PHYSICIAN ASSISTANT:   ASSISTANTS: none   ANESTHESIA:   general  EBL:  Total I/O In: -  Out: 150 [Urine:150]  BLOOD ADMINISTERED:none  DRAINS: foley to gravity   LOCAL MEDICATIONS USED:  NONE  SPECIMEN:  Source of Specimen:  Rt renal pelvis urine  DISPOSITION OF SPECIMEN:  microbiology  COUNTS:  YES  TOURNIQUET:  * No tourniquets in log *  DICTATION: .Other Dictation: Dictation Number [id number]  PLAN OF CARE: Admit to inpatient   PATIENT DISPOSITION:  PACU - hemodynamically stable.   Delay start of Pharmacological VTE agent (>24hrs) due to surgical blood loss or risk of bleeding: not applicable

## 2015-07-01 NOTE — Progress Notes (Signed)
Pharmacy Antibiotic Note  Katie Huerta is a 82 y.o. female admitted on 06/30/2015 with sepsis and UTI.  Pharmacy has been consulted for Vancomycin, levofloxacin, aztreonam dosing.  Plan: Vancomycin 750mg  IV every 24 hours.  Goal trough 15-20 mcg/mL.  Aztreonam 1gm iv q8hr Levofloxacin 750mg  iv q48hr  Height: 5\' 3"  (160 cm) Weight: 138 lb 0.1 oz (62.6 kg) IBW/kg (Calculated) : 52.4  Temp (24hrs), Avg:99.5 F (37.5 C), Min:98.4 F (36.9 C), Max:102.8 F (39.3 C)   Recent Labs Lab 06/30/15 1927 06/30/15 2135 07/01/15 0037 07/01/15 0304  WBC 12.9*  --   --  15.3*  CREATININE 1.20*  --   --  1.29*  LATICACIDVEN  --  6.10* 4.9* 3.9*    Estimated Creatinine Clearance: 25.9 mL/min (by C-G formula based on Cr of 1.29).    Allergies  Allergen Reactions  . Penicillins     unknown    Antimicrobials this admission: Vancomycin 5/2 >> Levofloxacin 5/3 >> Aztreonam 5/3 >>  Dose adjustments this admission: -  Microbiology results: Pending  Thank you for allowing pharmacy to be a part of this patient's care.  [name] 07/01/2015 5:59 AM

## 2015-07-02 ENCOUNTER — Encounter (HOSPITAL_COMMUNITY): Payer: Self-pay | Admitting: Urology

## 2015-07-02 ENCOUNTER — Inpatient Hospital Stay (HOSPITAL_COMMUNITY): Payer: Medicare Other

## 2015-07-02 ENCOUNTER — Encounter (HOSPITAL_COMMUNITY): Payer: Medicare Other

## 2015-07-02 DIAGNOSIS — N179 Acute kidney failure, unspecified: Secondary | ICD-10-CM

## 2015-07-02 DIAGNOSIS — F0390 Unspecified dementia without behavioral disturbance: Secondary | ICD-10-CM

## 2015-07-02 DIAGNOSIS — R7881 Bacteremia: Secondary | ICD-10-CM

## 2015-07-02 LAB — BASIC METABOLIC PANEL
ANION GAP: 12 (ref 5–15)
BUN: 22 mg/dL — ABNORMAL HIGH (ref 6–20)
CALCIUM: 8.1 mg/dL — AB (ref 8.9–10.3)
CHLORIDE: 112 mmol/L — AB (ref 101–111)
CO2: 18 mmol/L — AB (ref 22–32)
Creatinine, Ser: 1.13 mg/dL — ABNORMAL HIGH (ref 0.44–1.00)
GFR calc non Af Amer: 43 mL/min — ABNORMAL LOW (ref >60)
GFR, EST AFRICAN AMERICAN: 50 mL/min — AB (ref >60)
GLUCOSE: 107 mg/dL — AB (ref 65–99)
POTASSIUM: 4 mmol/L (ref 3.5–5.1)
Sodium: 142 mmol/L (ref 135–145)

## 2015-07-02 LAB — CBC
HEMATOCRIT: 36.2 % (ref 36.0–46.0)
HEMOGLOBIN: 11.8 g/dL — AB (ref 12.0–15.0)
MCH: 29.9 pg (ref 26.0–34.0)
MCHC: 32.6 g/dL (ref 30.0–36.0)
MCV: 91.6 fL (ref 78.0–100.0)
Platelets: 156 10*3/uL (ref 150–400)
RBC: 3.95 MIL/uL (ref 3.87–5.11)
RDW: 15.3 % (ref 11.5–15.5)
WBC: 18.9 10*3/uL — AB (ref 4.0–10.5)

## 2015-07-02 LAB — URINE CULTURE: Culture: NO GROWTH

## 2015-07-02 LAB — LACTIC ACID, PLASMA: Lactic Acid, Venous: 2.1 mmol/L (ref 0.5–2.0)

## 2015-07-02 MED ORDER — LORAZEPAM 2 MG/ML IJ SOLN
0.5000 mg | Freq: Once | INTRAMUSCULAR | Status: AC
  Administered 2015-07-02: 0.5 mg via INTRAVENOUS
  Filled 2015-07-02: qty 1

## 2015-07-02 MED ORDER — LACTATED RINGERS IV SOLN
INTRAVENOUS | Status: DC
  Administered 2015-07-02 (×2): via INTRAVENOUS

## 2015-07-02 NOTE — Progress Notes (Signed)
PROGRESS NOTE  Katie Huerta [id number] DOB: 13-Jul-1927 DOA: 06/30/2015 PCP: [name], MD  HPI/Recap of past 24 hours:  Feeling better, denies pain, baseline pleasantly demented Assessment/Plan: Principal Problem:   Severe sepsis ([place]) Active Problems:   Dementia   ARF (acute renal failure) ([facility])   Chronic anemia   Sepsis ([place])   Sepsis due to urinary tract infection ([place])  #1. Sepsis from UTI/bacteremia - on aztreonam, repeat blood culture to ensure clearance #2. Abrupt narrowing of the right ureter with left kidney showing some fatty lesion concerning for malignancy - urologist Dr. Tresa Moore input appreciated, s/p stenting, biopsy pending. #3. Acute renal failure - probably from dehydration, nausea vomiting, sepsis, uti, urinary obstruction. Continue with aggressive hydration and closely follow metabolic panel. #4. Gallstones - CAT scan doesn't show any features of cholecystitis. LFTs unremarkable on exam abdomen appears benign. No n/v, no ab pain #5. Chronic anemia - follow CBC. #6. Dementia - no acute issues.   DVT prophylaxis: SCDs since patient has hematuria and also may need procedures. Code Status: DO NOT RESUSCITATE.  Family Communication: Patient's husband.  Disposition Plan: Home.  Consults called: Urologist Dr. Tresa Moore.    Procedures: Undewent emergent ureteral stent placment 07/01/15 for renal deompression in setting of sepsis.    Antibiotics:  aztreonam   Objective: BP 147/69 mmHg  Pulse 90  Temp(Src) 99.2 F (37.3 C) (Oral)  Resp 24  Ht 5\' 3"  (1.6 m)  Wt 64.8 kg (142 lb 13.7 oz)  BMI 25.31 kg/m2  SpO2 92%  Intake/Output Summary (Last 24 hours) at 07/02/15 1453 Last data filed at 07/02/15 1402  Gross per 24 hour  Intake   1110 ml  Output   1377 ml  Net   -267 ml   Filed Weights   07/01/15 0116 07/01/15 2027 07/02/15 0417  Weight: 62.6 kg (138 lb 0.1 oz) 65.4 kg (144 lb 2.9 oz) 64.8 kg (142 lb 13.7 oz)    Exam:   General:   NAD, pleasant, following commands, baseline dementia  Cardiovascular: RRR  Respiratory: CTABL  Abdomen: Soft/ND/NT, positive BS  Musculoskeletal: No Edema  Neuro: dementia, know she is in the hospital, but states she is at [employer], not oriented to time  Data Reviewed: Basic Metabolic Panel:  Recent Labs Lab 06/30/15 1927 07/01/15 0304 07/02/15 0501  NA 142 141 142  K 3.1* 3.5 4.0  CL 108 114* 112*  CO2 22 18* 18*  GLUCOSE 127* 152* 107*  BUN 23* 20 22*  CREATININE 1.20* 1.29* 1.13*  CALCIUM 8.1* 7.0* 8.1*   Liver Function Tests:  Recent Labs Lab 06/30/15 1927 07/01/15 0304  AST 25 32  ALT 14 16  ALKPHOS 101 78  BILITOT 1.0 1.0  PROT 5.9* 4.6*  ALBUMIN 3.3* 2.4*    Recent Labs Lab 06/30/15 1927  LIPASE 23   No results for input(s): AMMONIA in the last 168 hours. CBC:  Recent Labs Lab 06/30/15 1927 07/01/15 0304 07/02/15 0501  WBC 12.9* 15.3* 18.9*  NEUTROABS  --  14.4*  --   HGB 11.8* 9.7* 11.8*  HCT 36.3 30.1* 36.2  MCV 89.6 92.9 91.6  PLT 176 129* 156   Cardiac Enzymes:   No results for input(s): CKTOTAL, CKMB, CKMBINDEX, TROPONINI in the last 168 hours. BNP (last 3 results) No results for input(s): BNP in the last 8760 hours.  ProBNP (last 3 results) No results for input(s): PROBNP in the last 8760 hours.  CBG: No results for input(s): GLUCAP  in the last 168 hours.  Recent Results (from the past 240 hour(s))  Urine culture     Status: Abnormal (Preliminary result)   Collection Time: 06/30/15  9:07 PM  Result Value Ref Range Status   Specimen Description URINE, CATHETERIZED  Final   Special Requests NONE  Final   Culture 10,000 COLONIES/mL GRAM NEGATIVE RODS (A)  Final   Report Status PENDING  Incomplete  Blood culture (routine x 2)     Status: Abnormal (Preliminary result)   Collection Time: 06/30/15  9:24 PM  Result Value Ref Range Status   Specimen Description BLOOD RIGHT ANTECUBITAL  Final   Special Requests BOTTLES DRAWN  AEROBIC AND ANAEROBIC 4CC  Final   Culture  Setup Time   Final    GRAM NEGATIVE RODS IN BOTH AEROBIC AND ANAEROBIC BOTTLES Organism ID to follow CRITICAL RESULT CALLED TO, READ BACK BY AND VERIFIED WITH: D WOFFORD,PHARM D AT 0228 07/01/15 BY L BENFIELD    Culture (A)  Final    KLEBSIELLA PNEUMONIAE SUSCEPTIBILITIES TO FOLLOW Performed at [facility]    Report Status PENDING  Incomplete  Blood Culture ID Panel (Reflexed)     Status: Abnormal   Collection Time: 06/30/15  9:24 PM  Result Value Ref Range Status   Enterococcus species NOT DETECTED NOT DETECTED Final   Vancomycin resistance NOT DETECTED NOT DETECTED Final   Listeria monocytogenes NOT DETECTED NOT DETECTED Final   Staphylococcus species NOT DETECTED NOT DETECTED Final   Staphylococcus aureus NOT DETECTED NOT DETECTED Final   Methicillin resistance NOT DETECTED NOT DETECTED Final   Streptococcus species NOT DETECTED NOT DETECTED Final   Streptococcus agalactiae NOT DETECTED NOT DETECTED Final   Streptococcus pneumoniae NOT DETECTED NOT DETECTED Final   Streptococcus pyogenes NOT DETECTED NOT DETECTED Final   Acinetobacter baumannii NOT DETECTED NOT DETECTED Final   Enterobacteriaceae species NOT DETECTED NOT DETECTED Final   Enterobacter cloacae complex NOT DETECTED NOT DETECTED Final   Escherichia coli NOT DETECTED NOT DETECTED Final   Klebsiella oxytoca NOT DETECTED NOT DETECTED Final   Klebsiella pneumoniae DETECTED (A) NOT DETECTED Final    Comment: CRITICAL RESULT CALLED TO, READ BACK BY AND VERIFIED WITH: D WOFFORD,PHARM D AT 0228 07/01/15 BY L BENFIELD    Proteus species NOT DETECTED NOT DETECTED Final   Serratia marcescens NOT DETECTED NOT DETECTED Final   Carbapenem resistance NOT DETECTED NOT DETECTED Final   Haemophilus influenzae NOT DETECTED NOT DETECTED Final   Neisseria meningitidis NOT DETECTED NOT DETECTED Final   Pseudomonas aeruginosa NOT DETECTED NOT DETECTED Final   Candida albicans NOT  DETECTED NOT DETECTED Final   Candida glabrata NOT DETECTED NOT DETECTED Final   Candida krusei NOT DETECTED NOT DETECTED Final   Candida parapsilosis NOT DETECTED NOT DETECTED Final   Candida tropicalis NOT DETECTED NOT DETECTED Final    Comment: Performed at [facility]  MRSA PCR Screening     Status: None   Collection Time: 07/01/15  1:51 AM  Result Value Ref Range Status   MRSA by PCR NEGATIVE NEGATIVE Final    Comment:        The GeneXpert MRSA Assay (FDA approved for NASAL specimens only), is one component of a comprehensive MRSA colonization surveillance program. It is not intended to diagnose MRSA infection nor to guide or monitor treatment for MRSA infections.   Anaerobic culture     Status: None (Preliminary result)   Collection Time: 07/01/15  5:28 AM  Result Value  Ref Range Status   Specimen Description KIDNEY RIGHT  Final   Special Requests PATIENT ON FOLLOWING LEVAQUIN/VANC  Final   Gram Stain   Final    NO ANAEROBES ISOLATED; CULTURE IN PROGRESS FOR 5 DAYS Performed at [facility]    Culture PENDING  Incomplete   Report Status PENDING  Incomplete  Urine culture     Status: None   Collection Time: 07/01/15  5:28 AM  Result Value Ref Range Status   Specimen Description KIDNEY RIGHT  Final   Special Requests PATIENT ON FOLLOWING LEVAQUIN/VANC  Final   Culture   Final    NO GROWTH 1 DAY Performed at [facility]    Report Status 07/02/2015 FINAL  Final  Gram stain     Status: None   Collection Time: 07/01/15  5:28 AM  Result Value Ref Range Status   Specimen Description KIDNEY RIGHT  Final   Special Requests PATIENT ON FOLLOWING LEVAQUIN/VANC  Final   Gram Stain   Final    ABUNDANT WBC PRESENT,BOTH PMN AND MONONUCLEAR NO ORGANISMS SEEN Performed at [facility]    Report Status 07/01/2015 FINAL  Final     Studies: No results found.  Scheduled Meds: . aztreonam  1 g Intravenous Q8H  . latanoprost  1 drop Both Eyes QHS   . sodium chloride  1,000 mL Intravenous Once    Continuous Infusions: . lactated ringers 75 mL/hr at 07/02/15 0854     Time spent: 65mins  Kharter Brew MD, PhD  [facility] Pager [phone]. If 7PM-7AM, please contact night-coverage at [email or web address], password [email or web address] 07/02/2015, 2:53 PM  LOS: 2 days

## 2015-07-02 NOTE — Progress Notes (Signed)
Patient's medial R elbow dark purple, bruised, swollen and hot to touch.  Dr. Erlinda Hong informed, new orders placed.  Patient not complaining of pain at site or while bending arm.  Will continue to monitor.

## 2015-07-02 NOTE — Progress Notes (Signed)
CRITICAL VALUE ALERT  Critical value received:  Lactic Acid 2.1  Date of notification:  07/02/2015  Time of notification:  7:52 am  Critical value read back:Yes.    Nurse who received alert:  Pearla Dubonnet RN  MD notified (1st page):  Florencia Reasons, MD  Time of first page: 7:53 am  MD notified (2nd page):  Time of second page:  Responding MD:  Erlinda Hong  Time MD responded:  7:53 am

## 2015-07-02 NOTE — Progress Notes (Signed)
1 Day Post-Op  Subjective:  1 - Right Hydronephrosis / Suspect Ureteral Neoplasm - mod-severe right hydro to mid ureter / iliac crossing to level of enhancing soft tissue density concerning for ureteral neoplasm by CT 06/2015. Remote smoker. Cr <1.5. She admits to hematuria for few weeks. 1 artery 2 vein right renovascular anatomy. Undewent emergent ureteral stent placment 07/01/15 for renal deompression in setting of sepsis.   2 - Small Left Solid Enhancing Renal Mass - 1.3 cm left anterior mid 50% exophytic mass incidental by ER CT 06/2015. 1 artery / 1 vein left renovascular anatomy. No additional cortical lesions.   3 - Urosepsis - Pt with fevers, tachycardia, lactic acidosis and bacteruria / pyuria consistent with likely urosepsis. [place], [place] 5/3 with gram negative / pending. Remains on empiric Aztreonam, Levaquin.   Today "Melodye" is stable. Still with some up and down fevers as expected. No hyptension.   Objective: Vital signs in last 24 hours: Temp:  [98.4 F (36.9 C)-100.2 F (37.9 C)] 98.7 F (37.1 C) (05/04 0612) Pulse Rate:  [78-90] 87 (05/04 0612) Resp:  [14-24] 20 (05/04 0612) BP: (111-150)/(44-82) 150/61 mmHg (05/04 0612) SpO2:  [92 %-97 %] 95 % (05/04 0612) Weight:  [64.8 kg (142 lb 13.7 oz)-65.4 kg (144 lb 2.9 oz)] 64.8 kg (142 lb 13.7 oz) (05/04 0417) Last BM Date: 06/30/15  Intake/Output from previous day: 05/03 0701 - 05/04 0700 In: 1680 [P.O.:30; I.V.:1600; IV Piggyback:50] Out: [id number] [Urine:1550; Stool:3] Intake/Output this shift:    General appearance: alert and stigmata of significant dementia, but very pleasant Head: Normocephalic, without obvious abnormality, atraumatic Nose: Nares normal. Septum midline. Mucosa normal. No drainage or sinus tenderness. Throat: lips, mucosa, and tongue normal; teeth and gums normal Neck: supple, symmetrical, trachea midline Back: symmetric, no curvature. ROM normal. No CVA tenderness. Resp: non-labored on [place] O2.  Cardio:  Nl rate GI: soft, non-tender; bowel sounds normal; no masses,  no organomegaly Extremities: extremities normal, atraumatic, no cyanosis or edema Pulses: 2+ and symmetric Neurologic: Mental status: AO x 2, but significantly demented.   Lab Results:   Recent Labs  07/01/15 0304 07/02/15 0501  WBC 15.3* 18.9*  HGB 9.7* 11.8*  HCT 30.1* 36.2  PLT 129* 156   BMET  Recent Labs  07/01/15 0304 07/02/15 0501  NA 141 142  K 3.5 4.0  CL 114* 112*  CO2 18* 18*  GLUCOSE 152* 107*  BUN 20 22*  CREATININE 1.29* 1.13*  CALCIUM 7.0* 8.1*   PT/INR  Recent Labs  07/01/15 0037  LABPROT 16.0*  INR 1.32   ABG No results for input(s): PHART, HCO3 in the last 72 hours.  Invalid input(s): PCO2, PO2  Studies/Results: Dg Chest 2 View  06/30/2015  CLINICAL DATA:  Vomiting and hypoxia today. EXAM: CHEST  2 VIEW COMPARISON:  None. FINDINGS: The mediastinal contour is normal. The heart size is enlarged. Both lungs are clear. There is elevation of the right hemidiaphragm. The visualized skeletal structures are unremarkable. IMPRESSION: No active cardiopulmonary disease. Electronically Signed   By: Abelardo Diesel M.D.   On: 06/30/2015 20:43   Ct Head Wo Contrast  06/30/2015  CLINICAL DATA:  Altered mental status and vomiting. EXAM: CT HEAD WITHOUT CONTRAST TECHNIQUE: Contiguous axial images were obtained from the base of the skull through the vertex without intravenous contrast. COMPARISON:  None. FINDINGS: Diffuse cerebral atrophy. Mild ventricular dilatation consistent with central atrophy. Low-attenuation changes in the deep white matter are nonspecific but likely represent small vessel ischemic change.  No mass effect or midline shift. No abnormal extra-axial fluid collections. Gray-white matter junctions are distinct. Basal cisterns are not effaced. No evidence of acute intracranial hemorrhage. No depressed skull fractures. Visualized paranasal sinuses and mastoid air cells are not opacified.  Vascular calcifications. IMPRESSION: No acute intracranial abnormalities. Chronic atrophy and small vessel ischemic changes. Electronically Signed   By: Lucienne Capers M.D.   On: 06/30/2015 22:33   Ct Angio Chest/abd/pel For Dissection W And/or W/wo  06/30/2015  CLINICAL DATA:  Vomiting brown fluid. Evaluate for thoracic aortic dissection. EXAM: CT ANGIOGRAPHY CHEST, ABDOMEN AND PELVIS TECHNIQUE: Multidetector CT imaging through the chest, abdomen and pelvis was performed using the standard protocol during bolus administration of intravenous contrast. Multiplanar reconstructed images and MIPs were obtained and reviewed to evaluate the vascular anatomy. CONTRAST:  100 cc Isovue 370 COMPARISON:  None. FINDINGS: Vascular Findings of the chest: There is mild fusiform ectasia of the ascending thoracic aorta was measurements as follows, though note, evaluation is degraded secondary to significant pulsation artifact. No definite thoracic aortic dissection or periaortic stranding on this nongated examination. Review of the precontrast images are negative for the presence of an intramural hematoma. The thoracic aorta tapers to a normal caliber at the level of the aortic arch. Conventional configuration of the aortic arch. The branch vessels of the aortic arch appear widely patent throughout their imaged course. Scattered atherosclerotic plaque within the descending thoracic aorta, not resulting in hemodynamically significant stenosis. Cardiomegaly.  No pericardial effusion. Although this examination was not tailored for the evaluation the pulmonary arteries, there are no discrete filling defects within the central pulmonary arterial tree to suggest central pulmonary embolism. Borderline enlarged caliber the main pulmonary artery measuring 32 mm in diameter ------------------------------------------------------------- Thoracic aortic measurements: Sinotubular junction 20 mm as measured in greatest oblique coronal  dimension. Proximal ascending aorta 38 mm as measured in greatest oblique axial dimension at the level of the main pulmonary artery (image 39, series 8, an approximately 38 mm in greatest oblique coronal diameter (coronal image 61, series 11). Aortic arch aorta 27 mm as measured in greatest oblique sagittal dimension. Proximal descending thoracic aorta 24 mm as measured in greatest oblique axial dimension at the level of the main pulmonary artery. Distal descending thoracic aorta 21 mm as measured in greatest oblique axial dimension at the level of the diaphragmatic hiatus. Review of the MIP images confirms the above findings. ------------------------------------------------------------- Non-Vascular Findings of the chest: Evaluation of the pulmonary parenchyma is degraded secondary to patient respiratory artifact. Minimal dependent subpleural ground-glass atelectasis. No focal airspace opacities. No pleural effusion or pneumothorax. The central pulmonary airways are narrowed about the hilum though patent. No discrete pulmonary nodules given limitation of the examination. Solitary high a right paratracheal lymph node is not enlarged by size criteria measuring 0.8 cm in greatest short axis diameter (image 17, series 8). No mediastinal, hilar axillary lymphadenopathy. No acute or aggressive osseous abnormalities within the chest. Regional soft tissues appear normal. The thyroid gland appears diminutive but otherwise normal. --------------------------------------------------------------- Vascular Findings of the abdomen and pelvis: Abdominal aorta: Moderate amount of mixed calcified and noncalcified atherosclerotic plaque within a normal caliber abdominal aorta, not resulting in hemodynamically significant stenosis. No abdominal aortic dissection or periaortic stranding. Celiac artery: There is a minimal amount of eccentric mixed calcified and noncalcified atherosclerotic plaque involving the origin of the celiac  artery, not resulting in hemodynamically significant stenosis. Conventional branching pattern. SMA: There is a minimal amount of eccentric mixed calcified plaque involving the  origin the SMA, not resulting in hemodynamically significant stenosis. Conventional branching pattern. The distal tributaries the SMA are widely patent without disc intraluminal filling defect suggest distal embolism. Right Renal artery: Solitary; widely patent without hemodynamically significant narrowing. No vessel irregularity to suggest FMD. Left Renal artery: Solitary; widely patent without hemodynamically significant narrowing. No vessel irregularity to suggest FMD. IMA: Widely patent without hemodynamically significant narrowing. Pelvic vasculature: There is a minimal amount of eccentric mixed calcified and noncalcified atherosclerotic plaque within normal caliber bilateral common iliac arteries, not resulting in hemodynamically significant stenosis. The bilateral internal iliac arteries are mildly disease though patent of normal caliber. The bilateral external iliac arteries are widely patent without hemodynamically significant narrowing. Review of the MIP images confirms the above findings. -------------------------------------------------------------------------------- Nonvascular Findings of the abdomen and pelvis: Evaluation of the abdominal organs is largely limited to the arterial phase of enhancement. Normal hepatic contour. There is diffuse decreased attenuation of the hepatic parenchyma on this postcontrast examination suggestive of hepatic steatosis. There is a punctate (approximately 1.7 cm) gallstone within neck of an otherwise normal-appearing gallbladder. No gallbladder wall thickening or pericholecystic stranding. No intra extrahepatic bili duct dilatation. No ascites. There is symmetric enhancement of the bilateral kidneys. There is rather abrupt tapered narrowing involving the mid/distal aspect of the right ureter  (representative axial images 151 and 155, series 8, coronal images 65 and 68, series 11) with minimal amount of adjacent periureteral stranding and moderate upstream ureterectasis and pelvicaliectasis. The etiology of this tapered narrowing is not depicted on this examination, specifically, no renal stones however appearance is worrisome for a nonocclusive uroepithelial lesion. Note is made of an approximately 1.1 x 1.3 cm partially exophytic hyper enhancing nodule within the anterior interpolar aspect of the left kidney worrisome for a small renal cell carcinoma. Note is also made of an approximately 1.4 cm hypo attenuating (4 Hounsfield unit) left-sided renal cyst. No discrete right-sided renal lesions. There is mild thickening of the left adrenal gland without discrete nodule. Normal appearance of the right adrenal gland. Normal appearance of the pancreas and spleen. Rather extensive colonic diverticulosis without evidence of diverticulitis. The bowel is normal in course and caliber without wall thickening or evidence of enteric obstruction. Normal appearance of the terminal ileum. The appendix is not visualized, however there is no pericecal inflammatory change. No pneumoperitoneum, pneumatosis or portal venous gas. No bulky retroperitoneal, mesenteric, pelvic or inguinal lymphadenopathy. Post hysterectomy. No discrete adnexal lesion. No free fluid in the pelvic cul-de-sac. No acute or aggressive osseous abnormalities. Mild bilateral facet degenerative change of the lower lumbar spine. Small mesenteric fat containing periumbilical hernia. IMPRESSION: Chest CTA Impression: 1. No acute cardiopulmonary disease. Specifically, no evidence of thoracic aortic dissection or central pulmonary embolism. 2. Mild fusiform ectasia of the ascending thoracic aorta measuring approximately 38 mm in diameter. 3. Cardiomegaly and enlargement of the caliber the main pulmonary artery, nonspecific though could be seen in the setting  of pulmonary arterial hypertension. Abdomen and pelvis CTA Impression: 1. There is rather abrupt tapered narrowing/occlusion involving the mid/distal aspect of the right ureter with associated moderate upstream ureterectasis and pelvicaliectasis. While the exact etiology of this rather abrupt tapered narrowing is not depicted on this examination, findings is worrisome for a uroepithelial lesion/malignancy. Further evaluation with ureteroscopy could be performed as clinically indicated. 2. Incidentally noted approximately 1.3 cm partially exophytic hyper enhancing left-sided renal lesion worrisome for a renal cell carcinoma. 3. Extensive colonic diverticulosis without evidence of diverticulitis. 4. Moderate amount of mixed calcified  and noncalcified atherosclerotic plaque within a normal caliber abdominal aorta, not resulting in hemodynamically significant stenosis. 5. Cholelithiasis without evidence cholecystitis. Electronically Signed   By: Sandi Mariscal M.D.   On: 06/30/2015 22:48    Anti-infectives: Anti-infectives    Start     Dose/Rate Route Frequency Ordered Stop   07/02/15 2200  levofloxacin (LEVAQUIN) IVPB 750 mg  Status:  Discontinued     750 mg 100 mL/hr over 90 Minutes Intravenous Every 48 hours 07/01/15 0559 07/01/15 1607   07/01/15 2200  vancomycin (VANCOCIN) IVPB 750 mg/150 ml premix  Status:  Discontinued     750 mg 150 mL/hr over 60 Minutes Intravenous Every 24 hours 07/01/15 0559 07/01/15 0845   07/01/15 0800  aztreonam (AZACTAM) 1 g in dextrose 5 % 50 mL IVPB     1 g 100 mL/hr over 30 Minutes Intravenous Every 8 hours 07/01/15 0559     07/01/15 0000  levofloxacin (LEVAQUIN) IVPB 750 mg     750 mg 100 mL/hr over 90 Minutes Intravenous  Once 07/01/15 0000 07/01/15 0318   07/01/15 0000  aztreonam (AZACTAM) 2 g in dextrose 5 % 50 mL IVPB     2 g 100 mL/hr over 30 Minutes Intravenous  Once 07/01/15 0000 07/01/15 0140   07/01/15 0000  vancomycin (VANCOCIN) IVPB 1000 mg/200 mL premix   Status:  Discontinued     1,000 mg 200 mL/hr over 60 Minutes Intravenous  Once 07/01/15 0000 07/01/15 0007   06/30/15 2215  ceFEPIme (MAXIPIME) 2 g in dextrose 5 % 50 mL IVPB     2 g 100 mL/hr over 30 Minutes Intravenous STAT 06/30/15 2200 06/30/15 2314   06/30/15 2200  vancomycin (VANCOCIN) IVPB 1000 mg/200 mL premix     1,000 mg 200 mL/hr over 60 Minutes Intravenous  Once 06/30/15 2145 06/30/15 2252      Assessment/Plan:  1 - Right Hydronephrosis / Suspect Ureteral Neoplasm - very concernig for right ureteral cancer. Now s/p renal decompression with stent, but will need ureteroscopy / biopsy in elective setting once clears infectious parameters. DDX benign and malignant etiologies discussed. Pt's husband and son updated on this and have good understanding and are in agreement.   2 - Small Left Solid Enhancing Renal Mass - likely small localized renal cell carcinoma. Given age and possibility of right urothelial neoplasm (much more aggressive malignancy) favor surveillance of this, consider intervention only if becomes >2.5-3cm.   3 - Urosepsis - agree with IV hydration, current ABX pending further CX data.  Please call me directly anytime with questions.   [facility], [place] 07/02/2015

## 2015-07-02 NOTE — Evaluation (Signed)
Physical Therapy Evaluation Patient Details Name: Katie Huerta MRN: [id number] DOB: 07-08-1927 Today's Date: 07/02/2015   History of Present Illness  82 yo female admitted with sepsis. S/P R ureteral stent placement 07/01/15. Hx of dementia  Clinical Impression  On eval, pt was Min guard assist for mobility-performed stand pivot x 2, bed<>bsc, with RW. No family present during session-unsure of PLOF/DME. Pt actually attempting to get OOB without assistance when therapist arrived. Will follow and progress activity as able. May not need HHPT follow depending on progress.     Follow Up Recommendations Home health PT;Supervision/Assistance - 24 hour (as long has husband can provide assistance)    Equipment Recommendations   (to be determined)    Recommendations for Other Services       Precautions / Restrictions Precautions Precautions: Fall Restrictions Weight Bearing Restrictions: No      Mobility  Bed Mobility Overal bed mobility: Needs Assistance Bed Mobility: Supine to Sit;Sit to Supine     Supine to sit: Min guard Sit to supine: Min guard   General bed mobility comments: close guard for safety  Transfers Overall transfer level: Needs assistance Equipment used: Rolling walker (2 wheeled) Transfers: Sit to/from [facility] Sit to Stand: Min guard Stand pivot transfers: Min guard       General transfer comment: close guard for safety. Stand pivot x 2, bed<>bsc, with RW  Ambulation/Gait                Stairs            Wheelchair Mobility    Modified Rankin (Stroke Patients Only)       Balance                                             Pertinent Vitals/Pain Pain Assessment: Faces Faces Pain Scale: No hurt    Home Living Family/patient expects to be discharged to:: Private residence Living Arrangements: Spouse/significant other               Additional Comments: unsure of PLOF/DME-no family  present during session    Prior Function           Comments: unsure of PLOF/DME-no family present during session     Hand Dominance        Extremity/Trunk Assessment   Upper Extremity Assessment: Overall WFL for tasks assessed           Lower Extremity Assessment: Generalized weakness      Cervical / Trunk Assessment: Normal  Communication   Communication: No difficulties  Cognition Arousal/Alertness: Awake/alert Behavior During Therapy: WFL for tasks assessed/performed Overall Cognitive Status: History of cognitive impairments - at baseline                      General Comments      Exercises        Assessment/Plan    PT Assessment Patient needs continued PT services  PT Diagnosis Difficulty walking;Generalized weakness   PT Problem List Decreased mobility;Decreased safety awareness;Decreased knowledge of use of DME;Decreased strength  PT Treatment Interventions Gait training;Functional mobility training;Therapeutic activities;Patient/family education;Therapeutic exercise;DME instruction   PT Goals (Current goals can be found in the Care Plan section) Acute Rehab PT Goals Patient Stated Goal: none stated PT Goal Formulation: Patient unable to participate in goal setting Time For Goal Achievement: 07/16/15 Potential  to Achieve Goals: Good    Frequency Min 3X/week   Barriers to discharge        Co-evaluation               End of Session   Activity Tolerance: Patient tolerated treatment well Patient left: in bed;with call bell/phone within reach;with bed alarm set           Time: [id number] PT Time Calculation (min) (ACUTE ONLY): 17 min   Charges:   PT Evaluation $PT Eval Low Complexity: 1 Procedure     PT G Codes:        Weston Anna, MPT Pager: [phone]

## 2015-07-02 NOTE — Progress Notes (Addendum)
Pt selected [facility] for HH needs. Referral given to in house rep.  

## 2015-07-03 ENCOUNTER — Inpatient Hospital Stay (HOSPITAL_COMMUNITY): Payer: Medicare Other

## 2015-07-03 DIAGNOSIS — R609 Edema, unspecified: Secondary | ICD-10-CM

## 2015-07-03 DIAGNOSIS — N390 Urinary tract infection, site not specified: Secondary | ICD-10-CM

## 2015-07-03 LAB — BASIC METABOLIC PANEL
Anion gap: 8 (ref 5–15)
BUN: 17 mg/dL (ref 6–20)
CALCIUM: 7.8 mg/dL — AB (ref 8.9–10.3)
CO2: 21 mmol/L — AB (ref 22–32)
CREATININE: 0.74 mg/dL (ref 0.44–1.00)
Chloride: 112 mmol/L — ABNORMAL HIGH (ref 101–111)
GFR calc Af Amer: >60 mL/min (ref >60)
GLUCOSE: 90 mg/dL (ref 65–99)
Potassium: 3.6 mmol/L (ref 3.5–5.1)
Sodium: 141 mmol/L (ref 135–145)

## 2015-07-03 LAB — CULTURE, BLOOD (ROUTINE X 2)

## 2015-07-03 LAB — CBC
HEMATOCRIT: 32.9 % — AB (ref 36.0–46.0)
Hemoglobin: 11.2 g/dL — ABNORMAL LOW (ref 12.0–15.0)
MCH: 29.8 pg (ref 26.0–34.0)
MCHC: 34 g/dL (ref 30.0–36.0)
MCV: 87.5 fL (ref 78.0–100.0)
PLATELETS: 146 10*3/uL — AB (ref 150–400)
RBC: 3.76 MIL/uL — ABNORMAL LOW (ref 3.87–5.11)
RDW: 14.8 % (ref 11.5–15.5)
WBC: 13.3 10*3/uL — AB (ref 4.0–10.5)

## 2015-07-03 LAB — HEMOGLOBIN A1C
Hgb A1c MFr Bld: 6.1 % — ABNORMAL HIGH (ref 4.8–5.6)
Mean Plasma Glucose: 128 mg/dL

## 2015-07-03 LAB — URINE CULTURE

## 2015-07-03 MED ORDER — DEXTROSE 5 % IV SOLN
2.0000 g | Freq: Three times a day (TID) | INTRAVENOUS | Status: DC
  Administered 2015-07-03 – 2015-07-05 (×6): 2 g via INTRAVENOUS
  Filled 2015-07-03 (×8): qty 2

## 2015-07-03 MED ORDER — METOPROLOL TARTRATE 25 MG PO TABS
12.5000 mg | ORAL_TABLET | Freq: Two times a day (BID) | ORAL | Status: DC
  Administered 2015-07-03 – 2015-07-04 (×3): 12.5 mg via ORAL
  Filled 2015-07-03 (×3): qty 1

## 2015-07-03 MED ORDER — POTASSIUM CHLORIDE CRYS ER 20 MEQ PO TBCR
40.0000 meq | EXTENDED_RELEASE_TABLET | Freq: Once | ORAL | Status: AC
  Administered 2015-07-03: 40 meq via ORAL
  Filled 2015-07-03: qty 2

## 2015-07-03 NOTE — Care Management Important Message (Signed)
Important Message  Patient Details  Name: Katie Huerta MRN: [id number] Date of Birth: 1927-07-01   Medicare Important Message Given:  Yes    Camillo Flaming 07/03/2015, 11:04 AMImportant Message  Patient Details  Name: Katie Huerta MRN: [id number] Date of Birth: 01-18-1928   Medicare Important Message Given:  Yes    Camillo Flaming 07/03/2015, 11:04 AM

## 2015-07-03 NOTE — Progress Notes (Signed)
2 Days Post-Op  Subjective:  1 - Right Hydronephrosis / Suspect Ureteral Neoplasm - mod-severe right hydro to mid ureter / iliac crossing to level of enhancing soft tissue density concerning for ureteral neoplasm by CT 06/2015. Remote smoker. Cr <1.5. She admits to hematuria for few weeks. 1 artery 2 vein right renovascular anatomy. Undewent emergent ureteral stent placment 07/01/15 for renal deompression in setting of sepsis.   2 - Small Left Solid Enhancing Renal Mass - 1.3 cm left anterior mid 50% exophytic mass incidental by ER CT 06/2015. 1 artery / 1 vein left renovascular anatomy. No additional cortical lesions.   3 - Urosepsis - Pt with fevers, tachycardia, lactic acidosis and bacteruria / pyuria consistent with likely urosepsis. [place], [place] 5/3 with Klebsiella / pending. Remains on empiric Aztreonam, Levaquin.   Today "[name]" is stable.   Objective: Vital signs in last 24 hours: Temp:  [98.8 F (37.1 C)-100.1 F (37.8 C)] 98.8 F (37.1 C) (05/05 0553) Pulse Rate:  [86-90] 86 (05/05 0553) Resp:  [20-24] 20 (05/05 0553) BP: (147-167)/(69-86) 167/80 mmHg (05/05 0553) SpO2:  [92 %-97 %] 97 % (05/05 0553) Weight:  [64.6 kg (142 lb 6.7 oz)] 64.6 kg (142 lb 6.7 oz) (05/05 0553) Last BM Date: 06/30/15  Intake/Output from previous day: 05/04 0701 - 05/05 0700 In: 1078.8 [P.O.:180; I.V.:848.8; IV Piggyback:50] Out: 1875 [id number] Intake/Output this shift:    General appearance: at baseline, pleasantly demented, hard of hearing.  Eyes: negative Nose: Nares normal. Septum midline. Mucosa normal. No drainage or sinus tenderness. Throat: lips, mucosa, and tongue normal; teeth and gums normal Neck: supple, symmetrical, trachea midline Back: symmetric, no curvature. ROM normal. No CVA tenderness. Resp: non-labored on room air Cardio: Nl rate GI: soft, non-tender; bowel sounds normal; no masses,  no organomegaly Extremities: extremities normal, atraumatic, no cyanosis or edema Skin:  Skin color, texture, turgor normal. No rashes or lesions Neurologic: Mental status: AO x 1-2. Stable.   Lab Results:   Recent Labs  07/02/15 0501 07/03/15 0529  WBC 18.9* 13.3*  HGB 11.8* 11.2*  HCT 36.2 32.9*  PLT 156 146*   BMET  Recent Labs  07/02/15 0501 07/03/15 0529  NA 142 141  K 4.0 3.6  CL 112* 112*  CO2 18* 21*  GLUCOSE 107* 90  BUN 22* 17  CREATININE 1.13* 0.74  CALCIUM 8.1* 7.8*   PT/INR  Recent Labs  07/01/15 0037  LABPROT 16.0*  INR 1.32   ABG No results for input(s): PHART, HCO3 in the last 72 hours.  Invalid input(s): PCO2, PO2  Studies/Results: Dg Elbow 2 Views Left  07/02/2015  CLINICAL DATA:  Left elbow swelling and redness. EXAM: LEFT ELBOW - 2 VIEW COMPARISON:  None. FINDINGS: No fracture.  No bone lesion. Elbow joint normally spaced and aligned. No arthropathic change. No joint effusion. There is subcutaneous soft tissue edema is diffusely. No soft tissue air. IMPRESSION: 1. No fracture, bone lesion or elbow joint abnormality. 2. Nonspecific subcutaneous soft tissue edema. Electronically Signed   By: Lajean Manes M.D.   On: 07/02/2015 15:45    Anti-infectives: Anti-infectives    Start     Dose/Rate Route Frequency Ordered Stop   07/02/15 2200  levofloxacin (LEVAQUIN) IVPB 750 mg  Status:  Discontinued     750 mg 100 mL/hr over 90 Minutes Intravenous Every 48 hours 07/01/15 0559 07/01/15 1607   07/01/15 2200  vancomycin (VANCOCIN) IVPB 750 mg/150 ml premix  Status:  Discontinued     750 mg  150 mL/hr over 60 Minutes Intravenous Every 24 hours 07/01/15 0559 07/01/15 0845   07/01/15 0800  aztreonam (AZACTAM) 1 g in dextrose 5 % 50 mL IVPB     1 g 100 mL/hr over 30 Minutes Intravenous Every 8 hours 07/01/15 0559     07/01/15 0000  levofloxacin (LEVAQUIN) IVPB 750 mg     750 mg 100 mL/hr over 90 Minutes Intravenous  Once 07/01/15 0000 07/01/15 0318   07/01/15 0000  aztreonam (AZACTAM) 2 g in dextrose 5 % 50 mL IVPB     2 g 100 mL/hr over  30 Minutes Intravenous  Once 07/01/15 0000 07/01/15 0140   07/01/15 0000  vancomycin (VANCOCIN) IVPB 1000 mg/200 mL premix  Status:  Discontinued     1,000 mg 200 mL/hr over 60 Minutes Intravenous  Once 07/01/15 0000 07/01/15 0007   06/30/15 2215  ceFEPIme (MAXIPIME) 2 g in dextrose 5 % 50 mL IVPB     2 g 100 mL/hr over 30 Minutes Intravenous STAT 06/30/15 2200 06/30/15 2314   06/30/15 2200  vancomycin (VANCOCIN) IVPB 1000 mg/200 mL premix     1,000 mg 200 mL/hr over 60 Minutes Intravenous  Once 06/30/15 2145 06/30/15 2252      Assessment/Plan:  1 - Right Hydronephrosis / Suspect Ureteral Neoplasm - very concernig for right ureteral cancer. Now s/p renal decompression with stent, but will need ureteroscopy / biopsy in elective setting once clears infectious parameters. DDX benign and malignant etiologies discussed. Pt's husband and son updated on this and have good understanding and are in agreement.   2 - Small Left Solid Enhancing Renal Mass - likely small localized renal cell carcinoma. Given age and possibility of right urothelial neoplasm (much more aggressive malignancy) favor surveillance of this, consider intervention only if becomes >2.5-3cm.   3 - Urosepsis - agree with IV hydration, current ABX pending further CX data.  Please call me directly anytime with questions. I have requested my partners see prn over the weekend.   [facility], [place]emm 07/03/2015

## 2015-07-03 NOTE — Progress Notes (Signed)
PROGRESS NOTE  Katie Huerta [id number] DOB: Aug 27, 1927 DOA: 06/30/2015 PCP: [name], MD  HPI/Recap of past 24 hours:  Fever subsided, tmax 100.1, cr normalized, has short runs of svt on tele strips Feeling better, denies pain, baseline pleasantly demented Family in room  Assessment/Plan: Principal Problem:   Severe sepsis ([place]) Active Problems:   Dementia   ARF (acute renal failure) ([place])   Chronic anemia   Sepsis ([place])   Sepsis due to urinary tract infection ([place])  #1. Sepsis from UTI/bacteremia - on aztreonam, repeat blood culture to ensure clearance #2. Abrupt narrowing of the right ureter with left kidney showing some fatty lesion concerning for malignancy - urologist Dr. Tresa Moore input appreciated, s/p stenting, biopsy once infection clears. #3. Acute renal failure - probably from dehydration, nausea vomiting, sepsis, uti, urinary obstruction. Cr normalized, d/c ivf. #4: SVT: short runs, bp stable, start lopressor, keep k>4, mag >2, check tsh and echo. #4. Gallstones - CAT scan doesn't show any features of cholecystitis. LFTs unremarkable on exam abdomen appears benign. No n/v, no ab pain #5. Chronic anemia - follow CBC. #6. Dementia - no acute issues.   DVT prophylaxis: SCDs since patient has hematuria  Code Status: DO NOT RESUSCITATE.  Family Communication: Patient's husband and son Disposition Plan: Home with home health in 1-2 days Consults called: Urologist Dr. Tresa Moore.    Procedures: Undewent emergent ureteral stent placment 07/01/15 for renal deompression in setting of sepsis.    Antibiotics:  aztreonam   Objective: BP 167/80 mmHg  Pulse 86  Temp(Src) 98.8 F (37.1 C) (Oral)  Resp 20  Ht 5\' 3"  (1.6 m)  Wt 64.6 kg (142 lb 6.7 oz)  BMI 25.23 kg/m2  SpO2 97%  Intake/Output Summary (Last 24 hours) at 07/03/15 1213 Last data filed at 07/03/15 0900  Gross per 24 hour  Intake 1198.75 ml  Output   1875 ml  Net -676.25 ml   Filed  Weights   07/01/15 2027 07/02/15 0417 07/03/15 0553  Weight: 65.4 kg (144 lb 2.9 oz) 64.8 kg (142 lb 13.7 oz) 64.6 kg (142 lb 6.7 oz)    Exam:   General:  NAD, pleasant, following commands, baseline dementia  Cardiovascular: RRR  Respiratory: CTABL  Abdomen: Soft/ND/NT, positive BS  Musculoskeletal: No Edema  Neuro: dementia, know she is in the hospital, but states she is at [employer], not oriented to time  Data Reviewed: Basic Metabolic Panel:  Recent Labs Lab 06/30/15 1927 07/01/15 0304 07/02/15 0501 07/03/15 0529  NA 142 141 142 141  K 3.1* 3.5 4.0 3.6  CL 108 114* 112* 112*  CO2 22 18* 18* 21*  GLUCOSE 127* 152* 107* 90  BUN 23* 20 22* 17  CREATININE 1.20* 1.29* 1.13* 0.74  CALCIUM 8.1* 7.0* 8.1* 7.8*   Liver Function Tests:  Recent Labs Lab 06/30/15 1927 07/01/15 0304  AST 25 32  ALT 14 16  ALKPHOS 101 78  BILITOT 1.0 1.0  PROT 5.9* 4.6*  ALBUMIN 3.3* 2.4*    Recent Labs Lab 06/30/15 1927  LIPASE 23   No results for input(s): AMMONIA in the last 168 hours. CBC:  Recent Labs Lab 06/30/15 1927 07/01/15 0304 07/02/15 0501 07/03/15 0529  WBC 12.9* 15.3* 18.9* 13.3*  NEUTROABS  --  14.4*  --   --   HGB 11.8* 9.7* 11.8* 11.2*  HCT 36.3 30.1* 36.2 32.9*  MCV 89.6 92.9 91.6 87.5  PLT 176 129* 156 146*   Cardiac Enzymes:  No results for input(s): CKTOTAL, CKMB, CKMBINDEX, TROPONINI in the last 168 hours. BNP (last 3 results) No results for input(s): BNP in the last 8760 hours.  ProBNP (last 3 results) No results for input(s): PROBNP in the last 8760 hours.  CBG: No results for input(s): GLUCAP in the last 168 hours.  Recent Results (from the past 240 hour(s))  Urine culture     Status: Abnormal   Collection Time: 06/30/15  9:07 PM  Result Value Ref Range Status   Specimen Description URINE, CATHETERIZED  Final   Special Requests NONE  Final   Culture 20,000 COLONIES/mL KLEBSIELLA PNEUMONIAE (A)  Final   Report Status 07/03/2015  FINAL  Final   Organism ID, Bacteria KLEBSIELLA PNEUMONIAE (A)  Final      Susceptibility   Klebsiella pneumoniae - MIC*    AMPICILLIN >=32 RESISTANT Resistant     CEFAZOLIN <=4 SENSITIVE Sensitive     CEFTRIAXONE <=1 SENSITIVE Sensitive     CIPROFLOXACIN <=0.25 SENSITIVE Sensitive     GENTAMICIN <=1 SENSITIVE Sensitive     IMIPENEM <=0.25 SENSITIVE Sensitive     NITROFURANTOIN 32 SENSITIVE Sensitive     TRIMETH/SULFA <=20 SENSITIVE Sensitive     AMPICILLIN/SULBACTAM 4 SENSITIVE Sensitive     PIP/TAZO 8 SENSITIVE Sensitive     * 20,000 COLONIES/mL KLEBSIELLA PNEUMONIAE  Blood culture (routine x 2)     Status: Abnormal   Collection Time: 06/30/15  9:24 PM  Result Value Ref Range Status   Specimen Description BLOOD RIGHT ANTECUBITAL  Final   Special Requests BOTTLES DRAWN AEROBIC AND ANAEROBIC 4CC  Final   Culture  Setup Time   Final    GRAM NEGATIVE RODS IN BOTH AEROBIC AND ANAEROBIC BOTTLES Organism ID to follow CRITICAL RESULT CALLED TO, READ BACK BY AND VERIFIED WITH: D WOFFORD,PHARM D AT 0228 07/01/15 BY L BENFIELD Performed at [facility] (A)  Final   Report Status 07/03/2015 FINAL  Final   Organism ID, Bacteria KLEBSIELLA PNEUMONIAE  Final      Susceptibility   Klebsiella pneumoniae - MIC*    AMPICILLIN >=32 RESISTANT Resistant     CEFAZOLIN <=4 SENSITIVE Sensitive     CEFEPIME <=1 SENSITIVE Sensitive     CEFTAZIDIME <=1 SENSITIVE Sensitive     CEFTRIAXONE <=1 SENSITIVE Sensitive     CIPROFLOXACIN <=0.25 SENSITIVE Sensitive     GENTAMICIN <=1 SENSITIVE Sensitive     IMIPENEM <=0.25 SENSITIVE Sensitive     TRIMETH/SULFA <=20 SENSITIVE Sensitive     AMPICILLIN/SULBACTAM 4 SENSITIVE Sensitive     PIP/TAZO 8 SENSITIVE Sensitive     * KLEBSIELLA PNEUMONIAE  Blood Culture ID Panel (Reflexed)     Status: Abnormal   Collection Time: 06/30/15  9:24 PM  Result Value Ref Range Status   Enterococcus species NOT DETECTED NOT DETECTED Final    Vancomycin resistance NOT DETECTED NOT DETECTED Final   Listeria monocytogenes NOT DETECTED NOT DETECTED Final   Staphylococcus species NOT DETECTED NOT DETECTED Final   Staphylococcus aureus NOT DETECTED NOT DETECTED Final   Methicillin resistance NOT DETECTED NOT DETECTED Final   Streptococcus species NOT DETECTED NOT DETECTED Final   Streptococcus agalactiae NOT DETECTED NOT DETECTED Final   Streptococcus pneumoniae NOT DETECTED NOT DETECTED Final   Streptococcus pyogenes NOT DETECTED NOT DETECTED Final   Acinetobacter baumannii NOT DETECTED NOT DETECTED Final   Enterobacteriaceae species NOT DETECTED NOT DETECTED Final   Enterobacter cloacae complex NOT DETECTED NOT  DETECTED Final   Escherichia coli NOT DETECTED NOT DETECTED Final   Klebsiella oxytoca NOT DETECTED NOT DETECTED Final   Klebsiella pneumoniae DETECTED (A) NOT DETECTED Final    Comment: CRITICAL RESULT CALLED TO, READ BACK BY AND VERIFIED WITH: D WOFFORD,PHARM D AT 0228 07/01/15 BY L BENFIELD    Proteus species NOT DETECTED NOT DETECTED Final   Serratia marcescens NOT DETECTED NOT DETECTED Final   Carbapenem resistance NOT DETECTED NOT DETECTED Final   Haemophilus influenzae NOT DETECTED NOT DETECTED Final   Neisseria meningitidis NOT DETECTED NOT DETECTED Final   Pseudomonas aeruginosa NOT DETECTED NOT DETECTED Final   Candida albicans NOT DETECTED NOT DETECTED Final   Candida glabrata NOT DETECTED NOT DETECTED Final   Candida krusei NOT DETECTED NOT DETECTED Final   Candida parapsilosis NOT DETECTED NOT DETECTED Final   Candida tropicalis NOT DETECTED NOT DETECTED Final    Comment: Performed at [facility]  MRSA PCR Screening     Status: None   Collection Time: 07/01/15  1:51 AM  Result Value Ref Range Status   MRSA by PCR NEGATIVE NEGATIVE Final    Comment:        The GeneXpert MRSA Assay (FDA approved for NASAL specimens only), is one component of a comprehensive MRSA colonization surveillance  program. It is not intended to diagnose MRSA infection nor to guide or monitor treatment for MRSA infections.   Anaerobic culture     Status: None (Preliminary result)   Collection Time: 07/01/15  5:28 AM  Result Value Ref Range Status   Specimen Description KIDNEY RIGHT  Final   Special Requests PATIENT ON FOLLOWING LEVAQUIN/VANC  Final   Gram Stain   Final    ABUNDANT WBC PRESENT,BOTH PMN AND MONONUCLEAR NO ORGANISMS SEEN Performed at [facility]    Culture   Final    NO ANAEROBES ISOLATED; CULTURE IN PROGRESS FOR 5 DAYS   Report Status PENDING  Incomplete  Urine culture     Status: None   Collection Time: 07/01/15  5:28 AM  Result Value Ref Range Status   Specimen Description KIDNEY RIGHT  Final   Special Requests PATIENT ON FOLLOWING LEVAQUIN/VANC  Final   Culture   Final    NO GROWTH 1 DAY Performed at [facility]    Report Status 07/02/2015 FINAL  Final  Gram stain     Status: None   Collection Time: 07/01/15  5:28 AM  Result Value Ref Range Status   Specimen Description KIDNEY RIGHT  Final   Special Requests PATIENT ON FOLLOWING LEVAQUIN/VANC  Final   Gram Stain   Final    ABUNDANT WBC PRESENT,BOTH PMN AND MONONUCLEAR NO ORGANISMS SEEN Performed at [facility]    Report Status 07/01/2015 FINAL  Final     Studies: Dg Elbow 2 Views Left  07/02/2015  CLINICAL DATA:  Left elbow swelling and redness. EXAM: LEFT ELBOW - 2 VIEW COMPARISON:  None. FINDINGS: No fracture.  No bone lesion. Elbow joint normally spaced and aligned. No arthropathic change. No joint effusion. There is subcutaneous soft tissue edema is diffusely. No soft tissue air. IMPRESSION: 1. No fracture, bone lesion or elbow joint abnormality. 2. Nonspecific subcutaneous soft tissue edema. Electronically Signed   By: Lajean Manes M.D.   On: 07/02/2015 15:45    Scheduled Meds: . aztreonam  1 g Intravenous Q8H  . latanoprost  1 drop Both Eyes QHS  . metoprolol tartrate  12.5 mg  Oral BID  .  potassium chloride  40 mEq Oral Once  . sodium chloride  1,000 mL Intravenous Once    Continuous Infusions: . lactated ringers 75 mL/hr at 07/02/15 2354     Time spent: 66mins  Crystallee Werden MD, PhD  [facility] Pager [phone]. If 7PM-7AM, please contact night-coverage at [email or web address], password [facility] 07/03/2015, 12:13 PM  LOS: 3 days

## 2015-07-03 NOTE — Progress Notes (Signed)
Pharmacy Antibiotic Note  Katie Huerta is a 82 y.o. female admitted on 06/30/2015 with sepsis and UTI s/p R ureter stent placement.  Pharmacy was initially consulted for vancomycin, Levaquin, and Aztreonam dosing.  Vanc has already been d/c.  Rapid Blood culture results show Klebsiella.  Antibiotics were narrowed to aztreonam.  Sensitivity results for Klebsiella blood and urine cultures show resistance to ampicillin only.  Patient has unknown reaction to penicillins listed in allergy list.  Patient received a dose of Cefepime 2g in the ED this admission without any apparent reaction.  Renal function improving, CrCl~45 ml/min.  Second set of blood cultures ordered by MD to ensure clearance of infection.  Plan: Will adjust aztreonam to 2g IV q8h for bacteremia indication and CrCl>30 ml/min. Would consider narrowing to a cephalosporin since patient appeared to have tolerated a dose of Cefepime this admission.  Height: 5\' 3"  (160 cm) Weight: 142 lb 6.7 oz (64.6 kg) IBW/kg (Calculated) : 52.4  Temp (24hrs), Avg:99.1 F (37.3 C), Min:98.4 F (36.9 C), Max:100.1 F (37.8 C)   Recent Labs Lab 06/30/15 1927 06/30/15 2135 07/01/15 0037 07/01/15 0304 07/02/15 0501 07/02/15 0659 07/03/15 0529  WBC 12.9*  --   --  15.3* 18.9*  --  13.3*  CREATININE 1.20*  --   --  1.29* 1.13*  --  0.74  LATICACIDVEN  --  6.10* 4.9* 3.9*  --  2.1*  --     Estimated Creatinine Clearance: 45.7 mL/min (by C-G formula based on Cr of 0.74).    Allergies  Allergen Reactions  . Penicillins     unknown   Antimicrobials this admission: Vancomycin 5/2 >> 5/3 Levofloxacin 5/3 >> 5/3 Aztreonam 5/3 >>  Dose adjustments this admission: -  Microbiology results: 5/2 BCx: Klebsiella pneumoniae (R- ampicillin only) 5/2 UCx (cath): 20K colonies/ml Klebsiella pneumoniae (R- ampicillin only)  5/3 MRSA PCR: neg 5/3 R kidney urine cx: NGF, no anaerobes isolated 5/3 UCx: NGF 5/5 BCx: sent  Thank you for  allowing pharmacy to be a part of this patient's care.  Hershal Coria, PharmD, BCPS Pager: [phone] 07/03/2015 2:07 PM

## 2015-07-03 NOTE — Progress Notes (Signed)
Physical Therapy Treatment Patient Details Name: Katie Huerta MRN: [id number] DOB: March 07, 1927 Today's Date: 07/03/2015    History of Present Illness 82 yo female admitted with sepsis. S/P R ureteral stent placement 07/01/15. Hx of dementia    PT Comments    Pt hit call light to request bathroom as I arrived.  Assisted OOB to amb to bathroom when pt had uncontrolled loose stools.  Required increased VC's for safety using walker properly.  Noted mild unsteady gait.  Poor recall of why she was admitted to hospital.  Pleasant.  Assisted with hygiene then amb back to bed.   Follow Up Recommendations  Home health PT;Supervision/Assistance - 24 hour     Equipment Recommendations   (unable to determine)    Recommendations for Other Services       Precautions / Restrictions Precautions Precautions: Fall Restrictions Weight Bearing Restrictions: No    Mobility  Bed Mobility Overal bed mobility: Needs Assistance Bed Mobility: Supine to Sit;Sit to Supine     Supine to sit: Min guard Sit to supine: Min guard   General bed mobility comments: close guard for safety  Transfers Overall transfer level: Needs assistance Equipment used: Rolling walker (2 wheeled) Transfers: Sit to/from Stand Sit to Stand: Min guard Stand pivot transfers: Min guard       General transfer comment: close guard for safety.  50% VC's on safety with turns and using walker throughout  Ambulation/Gait Ambulation/Gait assistance: Min guard;Supervision Ambulation Distance (Feet): 22 Feet Assistive device: Rolling walker (2 wheeled) Gait Pattern/deviations: Step-through pattern;Decreased stride length     General Gait Details: 50% VC's on proper walker to self distance and safety with turns.  Amb to and from bathroom only due to loose stools and fatigue.    Stairs            Wheelchair Mobility    Modified Rankin (Stroke Patients Only)       Balance                                     Cognition Arousal/Alertness: Awake/alert   Overall Cognitive Status: Impaired/Different from baseline Area of Impairment: Memory;Safety/judgement;Awareness                    Exercises      General Comments        Pertinent Vitals/Pain Pain Assessment: No/denies pain    Home Living                      Prior Function            PT Goals (current goals can now be found in the care plan section) Progress towards PT goals: Progressing toward goals    Frequency  Min 3X/week    PT Plan Current plan remains appropriate    Co-evaluation             End of Session Equipment Utilized During Treatment: Gait belt Activity Tolerance: Patient tolerated treatment well Patient left: in bed;with call bell/phone within reach;with bed alarm set     Time: [id number] PT Time Calculation (min) (ACUTE ONLY): 24 min  Charges:  $Gait Training: 8-22 mins $Therapeutic Activity: 8-22 mins                    G Codes:      Rica Koyanagi  PTA [facility]  Acute  Rehab  Pager      [phone]

## 2015-07-03 NOTE — Progress Notes (Signed)
VASCULAR LAB PRELIMINARY  PRELIMINARY  PRELIMINARY  PRELIMINARY  Left upper extremity venous duplex completed.    Left arm- No evidence of DVT or superficial thrombosis.     Sue Fernicola, RVT, RDMS 07/03/2015, 10:08 AM

## 2015-07-04 ENCOUNTER — Inpatient Hospital Stay (HOSPITAL_COMMUNITY): Payer: Medicare Other

## 2015-07-04 DIAGNOSIS — I471 Supraventricular tachycardia: Secondary | ICD-10-CM

## 2015-07-04 DIAGNOSIS — R002 Palpitations: Secondary | ICD-10-CM

## 2015-07-04 DIAGNOSIS — I10 Essential (primary) hypertension: Secondary | ICD-10-CM

## 2015-07-04 HISTORY — PX: TRANSTHORACIC ECHOCARDIOGRAM: SHX275

## 2015-07-04 LAB — CBC
HEMATOCRIT: 32.1 % — AB (ref 36.0–46.0)
HEMOGLOBIN: 10.9 g/dL — AB (ref 12.0–15.0)
MCH: 30.3 pg (ref 26.0–34.0)
MCHC: 34 g/dL (ref 30.0–36.0)
MCV: 89.2 fL (ref 78.0–100.0)
PLATELETS: 175 10*3/uL (ref 150–400)
RBC: 3.6 MIL/uL — ABNORMAL LOW (ref 3.87–5.11)
RDW: 14.4 % (ref 11.5–15.5)
WBC: 7.4 10*3/uL (ref 4.0–10.5)

## 2015-07-04 LAB — BASIC METABOLIC PANEL
Anion gap: 8 (ref 5–15)
BUN: 15 mg/dL (ref 6–20)
CHLORIDE: 113 mmol/L — AB (ref 101–111)
CO2: 22 mmol/L (ref 22–32)
Calcium: 7.8 mg/dL — ABNORMAL LOW (ref 8.9–10.3)
Creatinine, Ser: 0.79 mg/dL (ref 0.44–1.00)
GFR calc Af Amer: >60 mL/min (ref >60)
GFR calc non Af Amer: >60 mL/min (ref >60)
GLUCOSE: 85 mg/dL (ref 65–99)
Potassium: 3.7 mmol/L (ref 3.5–5.1)
Sodium: 143 mmol/L (ref 135–145)

## 2015-07-04 LAB — MAGNESIUM: Magnesium: 1.7 mg/dL (ref 1.7–2.4)

## 2015-07-04 LAB — ECHOCARDIOGRAM COMPLETE
Height: 63 in
Weight: 2201.07 oz

## 2015-07-04 LAB — TSH: TSH: 3.046 u[IU]/mL (ref 0.350–4.500)

## 2015-07-04 MED ORDER — MAGNESIUM SULFATE IN D5W 1-5 GM/100ML-% IV SOLN
1.0000 g | Freq: Once | INTRAVENOUS | Status: AC
  Administered 2015-07-04: 1 g via INTRAVENOUS
  Filled 2015-07-04: qty 100

## 2015-07-04 MED ORDER — POTASSIUM CHLORIDE CRYS ER 20 MEQ PO TBCR
40.0000 meq | EXTENDED_RELEASE_TABLET | Freq: Once | ORAL | Status: AC
  Administered 2015-07-04: 40 meq via ORAL
  Filled 2015-07-04: qty 2

## 2015-07-04 MED ORDER — METOPROLOL TARTRATE 25 MG PO TABS
25.0000 mg | ORAL_TABLET | Freq: Two times a day (BID) | ORAL | Status: DC
Start: 2015-07-04 — End: 2015-07-05
  Administered 2015-07-04 – 2015-07-05 (×2): 25 mg via ORAL
  Filled 2015-07-04 (×2): qty 1

## 2015-07-04 NOTE — Progress Notes (Signed)
Patient's foley leaking while changing linens.  Inserted 34ml of saline into balloon port.  Not noted to be leaking after this.  Will report to oncoming nurse and continue to monitor.

## 2015-07-04 NOTE — Progress Notes (Signed)
PROGRESS NOTE  Katie Huerta [id number] DOB: 11-20-1927 DOA: 06/30/2015 PCP: [name], MD  HPI/Recap of past 24 hours:  Fever resolved, cr normalized, no more runs of svt on tele strips after started on lopressor, wbc normalized, bp still elevated denies pain, baseline pleasantly demented Family in room  Assessment/Plan: Principal Problem:   Severe sepsis ([place]) Active Problems:   Dementia   ARF (acute renal failure) ([facility])   Chronic anemia   Sepsis ([place])   Sepsis due to urinary tract infection ([place])  #1. Sepsis from UTI/bacteremia - on aztreonam, repeat blood culture to ensure clearance, likely transition to bactrim once repeat blood culture no growth #2. Abrupt narrowing of the right ureter with left kidney showing some fatty lesion concerning for malignancy - urologist Dr. Tresa Moore input appreciated, s/p stenting, biopsy once infection clears. #3. Acute renal failure - probably from dehydration, nausea vomiting, sepsis, uti, urinary obstruction. Cr normalized, d/c ivf. #4: SVT: short runs, bp stable, start lopressor, keep k>4, mag >2, check tsh,  Echo with LVHlvef 60-65%, normal wall motion . #5 HTN: likely long standing as echo showed wall thickness was increased in the pattern of moderate to severe LVH, titrate lopressor #4. Gallstones - CAT scan doesn't show any features of cholecystitis. LFTs unremarkable on exam abdomen appears benign. No n/v, no ab pain #5. Chronic anemia - follow CBC. #6. Dementia - no acute issues. Patient husband is concerned about patient nutrition status, nutrition consult ordered.   DVT prophylaxis: SCDs since patient has hematuria  Code Status: DO NOT RESUSCITATE.  Family Communication: Patient's husband and son Disposition Plan: Home with home health, likely 5/7 Consults called: Urologist Dr. Tresa Moore.    Procedures: Undewent emergent ureteral stent placment 07/01/15 for renal deompression in setting of sepsis.     Antibiotics:  aztreonam   Objective: BP 178/69 mmHg  Pulse 61  Temp(Src) 97.9 F (36.6 C) (Oral)  Resp 18  Ht 5\' 3"  (1.6 m)  Wt 62.4 kg (137 lb 9.1 oz)  BMI 24.38 kg/m2  SpO2 97%  Intake/Output Summary (Last 24 hours) at 07/04/15 1343 Last data filed at 07/04/15 1100  Gross per 24 hour  Intake    340 ml  Output   2975 ml  Net  -2635 ml   Filed Weights   07/02/15 0417 07/03/15 0553 07/04/15 0537  Weight: 64.8 kg (142 lb 13.7 oz) 64.6 kg (142 lb 6.7 oz) 62.4 kg (137 lb 9.1 oz)    Exam:   General:  NAD, pleasant, following commands, baseline dementia  Cardiovascular: RRR  Respiratory: CTABL  Abdomen: Soft/ND/NT, positive BS  Musculoskeletal: No Edema  Neuro: dementia, know she is in the hospital, but states she is at [employer], not oriented to time  Data Reviewed: Basic Metabolic Panel:  Recent Labs Lab 06/30/15 1927 07/01/15 0304 07/02/15 0501 07/03/15 0529 07/04/15 0522  NA 142 141 142 141 143  K 3.1* 3.5 4.0 3.6 3.7  CL 108 114* 112* 112* 113*  CO2 22 18* 18* 21* 22  GLUCOSE 127* 152* 107* 90 85  BUN 23* 20 22* 17 15  CREATININE 1.20* 1.29* 1.13* 0.74 0.79  CALCIUM 8.1* 7.0* 8.1* 7.8* 7.8*  MG  --   --   --   --  1.7   Liver Function Tests:  Recent Labs Lab 06/30/15 1927 07/01/15 0304  AST 25 32  ALT 14 16  ALKPHOS 101 78  BILITOT 1.0 1.0  PROT 5.9* 4.6*  ALBUMIN 3.3* 2.4*  Recent Labs Lab 06/30/15 1927  LIPASE 23   No results for input(s): AMMONIA in the last 168 hours. CBC:  Recent Labs Lab 06/30/15 1927 07/01/15 0304 07/02/15 0501 07/03/15 0529 07/04/15 0522  WBC 12.9* 15.3* 18.9* 13.3* 7.4  NEUTROABS  --  14.4*  --   --   --   HGB 11.8* 9.7* 11.8* 11.2* 10.9*  HCT 36.3 30.1* 36.2 32.9* 32.1*  MCV 89.6 92.9 91.6 87.5 89.2  PLT 176 129* 156 146* 175   Cardiac Enzymes:   No results for input(s): CKTOTAL, CKMB, CKMBINDEX, TROPONINI in the last 168 hours. BNP (last 3 results) No results for input(s): BNP in  the last 8760 hours.  ProBNP (last 3 results) No results for input(s): PROBNP in the last 8760 hours.  CBG: No results for input(s): GLUCAP in the last 168 hours.  Recent Results (from the past 240 hour(s))  Urine culture     Status: Abnormal   Collection Time: 06/30/15  9:07 PM  Result Value Ref Range Status   Specimen Description URINE, CATHETERIZED  Final   Special Requests NONE  Final   Culture 20,000 COLONIES/mL KLEBSIELLA PNEUMONIAE (A)  Final   Report Status 07/03/2015 FINAL  Final   Organism ID, Bacteria KLEBSIELLA PNEUMONIAE (A)  Final      Susceptibility   Klebsiella pneumoniae - MIC*    AMPICILLIN >=32 RESISTANT Resistant     CEFAZOLIN <=4 SENSITIVE Sensitive     CEFTRIAXONE <=1 SENSITIVE Sensitive     CIPROFLOXACIN <=0.25 SENSITIVE Sensitive     GENTAMICIN <=1 SENSITIVE Sensitive     IMIPENEM <=0.25 SENSITIVE Sensitive     NITROFURANTOIN 32 SENSITIVE Sensitive     TRIMETH/SULFA <=20 SENSITIVE Sensitive     AMPICILLIN/SULBACTAM 4 SENSITIVE Sensitive     PIP/TAZO 8 SENSITIVE Sensitive     * 20,000 COLONIES/mL KLEBSIELLA PNEUMONIAE  Blood culture (routine x 2)     Status: Abnormal   Collection Time: 06/30/15  9:24 PM  Result Value Ref Range Status   Specimen Description BLOOD RIGHT ANTECUBITAL  Final   Special Requests BOTTLES DRAWN AEROBIC AND ANAEROBIC 4CC  Final   Culture  Setup Time   Final    GRAM NEGATIVE RODS IN BOTH AEROBIC AND ANAEROBIC BOTTLES Organism ID to follow CRITICAL RESULT CALLED TO, READ BACK BY AND VERIFIED WITH: D WOFFORD,PHARM D AT 0228 07/01/15 BY L BENFIELD Performed at [facility] (A)  Final   Report Status 07/03/2015 FINAL  Final   Organism ID, Bacteria KLEBSIELLA PNEUMONIAE  Final      Susceptibility   Klebsiella pneumoniae - MIC*    AMPICILLIN >=32 RESISTANT Resistant     CEFAZOLIN <=4 SENSITIVE Sensitive     CEFEPIME <=1 SENSITIVE Sensitive     CEFTAZIDIME <=1 SENSITIVE Sensitive      CEFTRIAXONE <=1 SENSITIVE Sensitive     CIPROFLOXACIN <=0.25 SENSITIVE Sensitive     GENTAMICIN <=1 SENSITIVE Sensitive     IMIPENEM <=0.25 SENSITIVE Sensitive     TRIMETH/SULFA <=20 SENSITIVE Sensitive     AMPICILLIN/SULBACTAM 4 SENSITIVE Sensitive     PIP/TAZO 8 SENSITIVE Sensitive     * KLEBSIELLA PNEUMONIAE  Blood Culture ID Panel (Reflexed)     Status: Abnormal   Collection Time: 06/30/15  9:24 PM  Result Value Ref Range Status   Enterococcus species NOT DETECTED NOT DETECTED Final   Vancomycin resistance NOT DETECTED NOT DETECTED Final   Listeria monocytogenes NOT DETECTED NOT  DETECTED Final   Staphylococcus species NOT DETECTED NOT DETECTED Final   Staphylococcus aureus NOT DETECTED NOT DETECTED Final   Methicillin resistance NOT DETECTED NOT DETECTED Final   Streptococcus species NOT DETECTED NOT DETECTED Final   Streptococcus agalactiae NOT DETECTED NOT DETECTED Final   Streptococcus pneumoniae NOT DETECTED NOT DETECTED Final   Streptococcus pyogenes NOT DETECTED NOT DETECTED Final   Acinetobacter baumannii NOT DETECTED NOT DETECTED Final   Enterobacteriaceae species NOT DETECTED NOT DETECTED Final   Enterobacter cloacae complex NOT DETECTED NOT DETECTED Final   Escherichia coli NOT DETECTED NOT DETECTED Final   Klebsiella oxytoca NOT DETECTED NOT DETECTED Final   Klebsiella pneumoniae DETECTED (A) NOT DETECTED Final    Comment: CRITICAL RESULT CALLED TO, READ BACK BY AND VERIFIED WITH: D WOFFORD,PHARM D AT 0228 07/01/15 BY L BENFIELD    Proteus species NOT DETECTED NOT DETECTED Final   Serratia marcescens NOT DETECTED NOT DETECTED Final   Carbapenem resistance NOT DETECTED NOT DETECTED Final   Haemophilus influenzae NOT DETECTED NOT DETECTED Final   Neisseria meningitidis NOT DETECTED NOT DETECTED Final   Pseudomonas aeruginosa NOT DETECTED NOT DETECTED Final   Candida albicans NOT DETECTED NOT DETECTED Final   Candida glabrata NOT DETECTED NOT DETECTED Final   Candida  krusei NOT DETECTED NOT DETECTED Final   Candida parapsilosis NOT DETECTED NOT DETECTED Final   Candida tropicalis NOT DETECTED NOT DETECTED Final    Comment: Performed at [facility]  MRSA PCR Screening     Status: None   Collection Time: 07/01/15  1:51 AM  Result Value Ref Range Status   MRSA by PCR NEGATIVE NEGATIVE Final    Comment:        The GeneXpert MRSA Assay (FDA approved for NASAL specimens only), is one component of a comprehensive MRSA colonization surveillance program. It is not intended to diagnose MRSA infection nor to guide or monitor treatment for MRSA infections.   Anaerobic culture     Status: None (Preliminary result)   Collection Time: 07/01/15  5:28 AM  Result Value Ref Range Status   Specimen Description KIDNEY RIGHT  Final   Special Requests PATIENT ON FOLLOWING LEVAQUIN/VANC  Final   Gram Stain   Final    ABUNDANT WBC PRESENT,BOTH PMN AND MONONUCLEAR NO ORGANISMS SEEN Performed at [facility]    Culture   Final    NO ANAEROBES ISOLATED; CULTURE IN PROGRESS FOR 5 DAYS   Report Status PENDING  Incomplete  Urine culture     Status: None   Collection Time: 07/01/15  5:28 AM  Result Value Ref Range Status   Specimen Description KIDNEY RIGHT  Final   Special Requests PATIENT ON FOLLOWING LEVAQUIN/VANC  Final   Culture   Final    NO GROWTH 1 DAY Performed at [facility]    Report Status 07/02/2015 FINAL  Final  Gram stain     Status: None   Collection Time: 07/01/15  5:28 AM  Result Value Ref Range Status   Specimen Description KIDNEY RIGHT  Final   Special Requests PATIENT ON FOLLOWING LEVAQUIN/VANC  Final   Gram Stain   Final    ABUNDANT WBC PRESENT,BOTH PMN AND MONONUCLEAR NO ORGANISMS SEEN Performed at [facility]    Report Status 07/01/2015 FINAL  Final     Studies: No results found.  Scheduled Meds: . aztreonam  2 g Intravenous Q8H  . latanoprost  1 drop Both Eyes QHS  . magnesium sulfate  1 - 4 g  bolus IVPB  1 g Intravenous Once  . metoprolol tartrate  12.5 mg Oral BID  . potassium chloride  40 mEq Oral Once  . sodium chloride  1,000 mL Intravenous Once    Continuous Infusions:     Time spent: 60mins  Lakyn Mantione MD, PhD  [facility] Pager [phone]. If 7PM-7AM, please contact night-coverage at [email or web address], password [facility] 07/04/2015, 1:43 PM  LOS: 4 days

## 2015-07-04 NOTE — Progress Notes (Signed)
Echocardiogram 2D Echocardiogram has been performed.  Tresa Res 07/04/2015, 9:15 AM

## 2015-07-05 DIAGNOSIS — N135 Crossing vessel and stricture of ureter without hydronephrosis: Secondary | ICD-10-CM

## 2015-07-05 DIAGNOSIS — N1330 Unspecified hydronephrosis: Secondary | ICD-10-CM

## 2015-07-05 DIAGNOSIS — R319 Hematuria, unspecified: Secondary | ICD-10-CM

## 2015-07-05 LAB — BASIC METABOLIC PANEL
Anion gap: 6 (ref 5–15)
BUN: 16 mg/dL (ref 6–20)
CHLORIDE: 113 mmol/L — AB (ref 101–111)
CO2: 24 mmol/L (ref 22–32)
Calcium: 7.8 mg/dL — ABNORMAL LOW (ref 8.9–10.3)
Creatinine, Ser: 0.75 mg/dL (ref 0.44–1.00)
GFR calc non Af Amer: >60 mL/min (ref >60)
Glucose, Bld: 88 mg/dL (ref 65–99)
POTASSIUM: 3.9 mmol/L (ref 3.5–5.1)
SODIUM: 143 mmol/L (ref 135–145)

## 2015-07-05 LAB — MAGNESIUM: MAGNESIUM: 2 mg/dL (ref 1.7–2.4)

## 2015-07-05 MED ORDER — METOPROLOL TARTRATE 25 MG PO TABS: 25.0000 mg | ORAL_TABLET | Freq: Two times a day (BID) | ORAL | Status: DC

## 2015-07-05 MED ORDER — SULFAMETHOXAZOLE-TRIMETHOPRIM 800-160 MG PO TABS: 1.0000 | ORAL_TABLET | Freq: Two times a day (BID) | ORAL | Status: DC

## 2015-07-05 NOTE — Discharge Summary (Signed)
Discharge Summary  Katie Huerta [id number] DOB: 26-Apr-1927  PCP: [name], MD  Admit date: 06/30/2015 Discharge date: 07/05/2015  Time spent: <61mins  Recommendations for Outpatient Follow-up:  1. F/u with PMD within a week  for hospital discharge follow up, repeat cbc/bmp at follow up, pmd to monitor blood pressure control, pmd to follow up on repeat blood culture final result, no growth at discharge. 2. F/u with urology for right ureteral mass and biopsy  Discharge Diagnoses:  Active Hospital Problems   Diagnosis Date Noted  . Severe sepsis ([place]) 06/30/2015  . Sepsis ([place]) 07/01/2015  . Sepsis due to urinary tract infection ([place]) 07/01/2015  . ARF (acute renal failure) ([place]) 06/30/2015  . Chronic anemia 06/30/2015  . Dementia 03/25/2015    Resolved Hospital Problems   Diagnosis Date Noted Date Resolved  No resolved problems to display.    Discharge Condition: stable  Diet recommendation: heart healthy  Filed Weights   07/02/15 0417 07/03/15 0553 07/04/15 0537  Weight: 64.8 kg (142 lb 13.7 oz) 64.6 kg (142 lb 6.7 oz) 62.4 kg (137 lb 9.1 oz)    History of present illness:  Chief Complaint: Fever and chills.  History obtained from patient's husband.  HPI: Katie Huerta is a 82 y.o. female with medical history significant of with history of dementia was brought to the ER after patient had upper onset of fever and chills and back pain. As per patient's husband patient was doing fine earlier in the day and later in the evening around 9 PM patient started developing fever and chills abruptly and patient was complaining of severe back pain in the lower part. Patient also had one episode of nausea and vomiting. Denied any chest pain shortness of breath productive cough or diarrhea. In the ER patient was found to be febrile and a CT angiogram of the chest abdomen and pelvis was done which shows features concerning for urothelial malignancy. There was abrupt  narrowing of the right ureter and left kidney with shadowing exophytic lesion. Patient's lactic acid was around 6 and patient was given fluid bolus for sepsis. On my exam patient is following commands and at this time pain has resolved. As per patient's husband patient has been having hematuria last 2 days.  ED Course: Patient was started on fluid bolus for sepsis with empiric antibiotics.  Hospital Course:  Principal Problem:   Severe sepsis ([place]) Active Problems:   Dementia   ARF (acute renal failure) ([facility])   Chronic anemia   Sepsis ([place])   Sepsis due to urinary tract infection ([place])  #1. Sepsis from klebsiella UTI/bacteremia - treated with aztreonam, repeat blood culture no growth,  Fever /leukocytosis/lactic acidosis resolved, repeat blood culture no growth so far, she is discharged on bactrim for 8 days to finish total of 14day abx treatment.  #2. Hematuria/ Abrupt narrowing of the right ureter with left kidney showing some fatty lesion concerning for malignancy - asa stopped. urologist Dr. Tresa Moore input appreciated, s/p stenting, biopsy once infection clears. Foley removed at discharge #3. Acute renal failure - probably from dehydration, nausea vomiting, sepsis, uti, urinary obstruction. Cr normalized, d/c ivf. #4: SVT: short runs, bp stable, start lopressor, keep k>4, mag >2, tsh wnl, Echo with LVHlvef 60-65%, normal wall motion . #5 HTN: previously not on bp meds, likely long standing as echo showed wall thickness was increased in the pattern of moderate to severe LVH, titrate lopressor #4. Gallstones - CAT scan doesn't show any features of cholecystitis.  LFTs unremarkable on exam abdomen appears benign. No n/v, no ab pain #5. Chronic anemia - CBC stable, #6. Dementia - no acute issues. Patient husband is concerned about patient nutrition status, nutrition consult ordered.   DVT prophylaxis: SCDs since patient has hematuria  Code Status: DO NOT RESUSCITATE.  Family Communication:  Patient's husband and son Disposition Plan: Home with home health 5/7 Consults called: Urologist Dr. Tresa Moore.    Procedures: Undewent emergent ureteral stent placment 07/01/15 for renal deompression in setting of sepsis.    Antibiotics:  Cefepime x1 on 5/2, levaquin x1 on 5/2  Aztreonam from 5/3   Discharge Exam: BP 144/69 mmHg  Pulse 67  Temp(Src) 98.9 F (37.2 C) (Oral)  Resp 16  Ht 5\' 3"  (1.6 m)  Wt 62.4 kg (137 lb 9.1 oz)  BMI 24.38 kg/m2  SpO2 96%   General: NAD, pleasant, following commands, baseline dementia  Cardiovascular: RRR  Respiratory: CTABL  Abdomen: Soft/ND/NT, positive BS  Musculoskeletal: No Edema  Neuro: dementia, know she is in the hospital, but states she is at [employer], not oriented to time   Discharge Instructions You were cared for by a hospitalist during your hospital stay. If you have any questions about your discharge medications or the care you received while you were in the hospital after you are discharged, you can call the unit and asked to speak with the hospitalist on call if the hospitalist that took care of you is not available. Once you are discharged, your primary care physician will handle any further medical issues. Please note that NO REFILLS for any discharge medications will be authorized once you are discharged, as it is imperative that you return to your primary care physician (or establish a relationship with a primary care physician if you do not have one) for your aftercare needs so that they can reassess your need for medications and monitor your lab values.      Discharge Instructions    Diet - low sodium heart healthy    Complete by:  As directed      Face-to-face encounter (required for Medicare/Medicaid patients)    Complete by:  As directed   I Reese Senk certify that this patient is under my care and that I, or a nurse practitioner or physician's assistant working with me, had a face-to-face encounter that meets the  physician face-to-face encounter requirements with this patient on 07/03/2015. The encounter with the patient was in whole, or in part for the following medical condition(s) which is the primary reason for home health care (List medical condition): FTt  The encounter with the patient was in whole, or in part, for the following medical condition, which is the primary reason for home health care:  FTT  I certify that, based on my findings, the following services are medically necessary home health services:   Nursing Physical therapy    Reason for Medically Necessary Home Health Services:  Skilled Nursing- Change/Decline in Patient Status  My clinical findings support the need for the above services:  Cognitive impairments, dementia, or mental confusion  that make it unsafe to leave home  Further, I certify that my clinical findings support that this patient is homebound due to:  Unable to leave home safely without assistance     Home Health    Complete by:  As directed   To provide the following care/treatments:   PT [facility]  Medication List    STOP taking these medications        aspirin 81 MG tablet      TAKE these medications        B-12 2000 MCG Tabs  Take 2,000 mcg by mouth daily.     Calcium-Vitamin D3 600-400 MG-UNIT Caps  Take 2 tablets by mouth every morning.     ICAPS Caps  Take 1 capsule by mouth 2 (two) times daily.     latanoprost 0.005 % ophthalmic solution  Commonly known as:  XALATAN  Place 1 drop into both eyes at bedtime.     metoprolol tartrate 25 MG tablet  Commonly known as:  LOPRESSOR  Take 1 tablet (25 mg total) by mouth 2 (two) times daily.     sulfamethoxazole-trimethoprim 800-160 MG tablet  Commonly known as:  BACTRIM DS,SEPTRA DS  Take 1 tablet by mouth 2 (two) times daily.       Allergies  Allergen Reactions  . Penicillins     unknown   Follow-up Information    Follow up with Alexis Frock, MD.    Specialty:  Urology   Why:  we will contact you to arrange next stage surgery / ureteroscopy and biopsy   Contact information:   [address] [place] [address] [phone]       Follow up with [name], MD In 1 week.   Specialty:  Pediatrics   Why:  hospital discharge follow up, repeat cbc/bmp at follow up, pmd to continue monitor blood pressure control.   Contact information:   [place] [address] [phone]       Follow up with [facility].   Why:  Home Health RN, Physical Therapy, aide and Social Worker   Contact information:   [address] [place] [address] [phone]        The results of significant diagnostics from this hospitalization (including imaging, microbiology, ancillary and laboratory) are listed below for reference.    Significant Diagnostic Studies: Dg Chest 2 View  06/30/2015  CLINICAL DATA:  Vomiting and hypoxia today. EXAM: CHEST  2 VIEW COMPARISON:  None. FINDINGS: The mediastinal contour is normal. The heart size is enlarged. Both lungs are clear. There is elevation of the right hemidiaphragm. The visualized skeletal structures are unremarkable. IMPRESSION: No active cardiopulmonary disease. Electronically Signed   By: Abelardo Diesel M.D.   On: 06/30/2015 20:43   Dg Elbow 2 Views Left  07/02/2015  CLINICAL DATA:  Left elbow swelling and redness. EXAM: LEFT ELBOW - 2 VIEW COMPARISON:  None. FINDINGS: No fracture.  No bone lesion. Elbow joint normally spaced and aligned. No arthropathic change. No joint effusion. There is subcutaneous soft tissue edema is diffusely. No soft tissue air. IMPRESSION: 1. No fracture, bone lesion or elbow joint abnormality. 2. Nonspecific subcutaneous soft tissue edema. Electronically Signed   By: Lajean Manes M.D.   On: 07/02/2015 15:45   Ct Head Wo Contrast  06/30/2015  CLINICAL DATA:  Altered mental status and vomiting. EXAM: CT HEAD WITHOUT CONTRAST TECHNIQUE:  Contiguous axial images were obtained from the base of the skull through the vertex without intravenous contrast. COMPARISON:  None. FINDINGS: Diffuse cerebral atrophy. Mild ventricular dilatation consistent with central atrophy. Low-attenuation changes in the deep white matter are nonspecific but likely represent small vessel ischemic change. No mass effect or midline shift. No abnormal extra-axial fluid collections. Gray-white matter junctions are distinct. Basal cisterns are not  effaced. No evidence of acute intracranial hemorrhage. No depressed skull fractures. Visualized paranasal sinuses and mastoid air cells are not opacified. Vascular calcifications. IMPRESSION: No acute intracranial abnormalities. Chronic atrophy and small vessel ischemic changes. Electronically Signed   By: Lucienne Capers M.D.   On: 06/30/2015 22:33   Ct Angio Chest/abd/pel For Dissection W And/or W/wo  06/30/2015  CLINICAL DATA:  Vomiting brown fluid. Evaluate for thoracic aortic dissection. EXAM: CT ANGIOGRAPHY CHEST, ABDOMEN AND PELVIS TECHNIQUE: Multidetector CT imaging through the chest, abdomen and pelvis was performed using the standard protocol during bolus administration of intravenous contrast. Multiplanar reconstructed images and MIPs were obtained and reviewed to evaluate the vascular anatomy. CONTRAST:  100 cc Isovue 370 COMPARISON:  None. FINDINGS: Vascular Findings of the chest: There is mild fusiform ectasia of the ascending thoracic aorta was measurements as follows, though note, evaluation is degraded secondary to significant pulsation artifact. No definite thoracic aortic dissection or periaortic stranding on this nongated examination. Review of the precontrast images are negative for the presence of an intramural hematoma. The thoracic aorta tapers to a normal caliber at the level of the aortic arch. Conventional configuration of the aortic arch. The branch vessels of the aortic arch appear widely patent throughout  their imaged course. Scattered atherosclerotic plaque within the descending thoracic aorta, not resulting in hemodynamically significant stenosis. Cardiomegaly.  No pericardial effusion. Although this examination was not tailored for the evaluation the pulmonary arteries, there are no discrete filling defects within the central pulmonary arterial tree to suggest central pulmonary embolism. Borderline enlarged caliber the main pulmonary artery measuring 32 mm in diameter ------------------------------------------------------------- Thoracic aortic measurements: Sinotubular junction 20 mm as measured in greatest oblique coronal dimension. Proximal ascending aorta 38 mm as measured in greatest oblique axial dimension at the level of the main pulmonary artery (image 39, series 8, an approximately 38 mm in greatest oblique coronal diameter (coronal image 61, series 11). Aortic arch aorta 27 mm as measured in greatest oblique sagittal dimension. Proximal descending thoracic aorta 24 mm as measured in greatest oblique axial dimension at the level of the main pulmonary artery. Distal descending thoracic aorta 21 mm as measured in greatest oblique axial dimension at the level of the diaphragmatic hiatus. Review of the MIP images confirms the above findings. ------------------------------------------------------------- Non-Vascular Findings of the chest: Evaluation of the pulmonary parenchyma is degraded secondary to patient respiratory artifact. Minimal dependent subpleural ground-glass atelectasis. No focal airspace opacities. No pleural effusion or pneumothorax. The central pulmonary airways are narrowed about the hilum though patent. No discrete pulmonary nodules given limitation of the examination. Solitary high a right paratracheal lymph node is not enlarged by size criteria measuring 0.8 cm in greatest short axis diameter (image 17, series 8). No mediastinal, hilar axillary lymphadenopathy. No acute or aggressive  osseous abnormalities within the chest. Regional soft tissues appear normal. The thyroid gland appears diminutive but otherwise normal. --------------------------------------------------------------- Vascular Findings of the abdomen and pelvis: Abdominal aorta: Moderate amount of mixed calcified and noncalcified atherosclerotic plaque within a normal caliber abdominal aorta, not resulting in hemodynamically significant stenosis. No abdominal aortic dissection or periaortic stranding. Celiac artery: There is a minimal amount of eccentric mixed calcified and noncalcified atherosclerotic plaque involving the origin of the celiac artery, not resulting in hemodynamically significant stenosis. Conventional branching pattern. SMA: There is a minimal amount of eccentric mixed calcified plaque involving the origin the SMA, not resulting in hemodynamically significant stenosis. Conventional branching pattern. The distal tributaries the SMA are widely patent without  disc intraluminal filling defect suggest distal embolism. Right Renal artery: Solitary; widely patent without hemodynamically significant narrowing. No vessel irregularity to suggest FMD. Left Renal artery: Solitary; widely patent without hemodynamically significant narrowing. No vessel irregularity to suggest FMD. IMA: Widely patent without hemodynamically significant narrowing. Pelvic vasculature: There is a minimal amount of eccentric mixed calcified and noncalcified atherosclerotic plaque within normal caliber bilateral common iliac arteries, not resulting in hemodynamically significant stenosis. The bilateral internal iliac arteries are mildly disease though patent of normal caliber. The bilateral external iliac arteries are widely patent without hemodynamically significant narrowing. Review of the MIP images confirms the above findings. -------------------------------------------------------------------------------- Nonvascular Findings of the abdomen and  pelvis: Evaluation of the abdominal organs is largely limited to the arterial phase of enhancement. Normal hepatic contour. There is diffuse decreased attenuation of the hepatic parenchyma on this postcontrast examination suggestive of hepatic steatosis. There is a punctate (approximately 1.7 cm) gallstone within neck of an otherwise normal-appearing gallbladder. No gallbladder wall thickening or pericholecystic stranding. No intra extrahepatic bili duct dilatation. No ascites. There is symmetric enhancement of the bilateral kidneys. There is rather abrupt tapered narrowing involving the mid/distal aspect of the right ureter (representative axial images 151 and 155, series 8, coronal images 65 and 68, series 11) with minimal amount of adjacent periureteral stranding and moderate upstream ureterectasis and pelvicaliectasis. The etiology of this tapered narrowing is not depicted on this examination, specifically, no renal stones however appearance is worrisome for a nonocclusive uroepithelial lesion. Note is made of an approximately 1.1 x 1.3 cm partially exophytic hyper enhancing nodule within the anterior interpolar aspect of the left kidney worrisome for a small renal cell carcinoma. Note is also made of an approximately 1.4 cm hypo attenuating (4 Hounsfield unit) left-sided renal cyst. No discrete right-sided renal lesions. There is mild thickening of the left adrenal gland without discrete nodule. Normal appearance of the right adrenal gland. Normal appearance of the pancreas and spleen. Rather extensive colonic diverticulosis without evidence of diverticulitis. The bowel is normal in course and caliber without wall thickening or evidence of enteric obstruction. Normal appearance of the terminal ileum. The appendix is not visualized, however there is no pericecal inflammatory change. No pneumoperitoneum, pneumatosis or portal venous gas. No bulky retroperitoneal, mesenteric, pelvic or inguinal lymphadenopathy.  Post hysterectomy. No discrete adnexal lesion. No free fluid in the pelvic cul-de-sac. No acute or aggressive osseous abnormalities. Mild bilateral facet degenerative change of the lower lumbar spine. Small mesenteric fat containing periumbilical hernia. IMPRESSION: Chest CTA Impression: 1. No acute cardiopulmonary disease. Specifically, no evidence of thoracic aortic dissection or central pulmonary embolism. 2. Mild fusiform ectasia of the ascending thoracic aorta measuring approximately 38 mm in diameter. 3. Cardiomegaly and enlargement of the caliber the main pulmonary artery, nonspecific though could be seen in the setting of pulmonary arterial hypertension. Abdomen and pelvis CTA Impression: 1. There is rather abrupt tapered narrowing/occlusion involving the mid/distal aspect of the right ureter with associated moderate upstream ureterectasis and pelvicaliectasis. While the exact etiology of this rather abrupt tapered narrowing is not depicted on this examination, findings is worrisome for a uroepithelial lesion/malignancy. Further evaluation with ureteroscopy could be performed as clinically indicated. 2. Incidentally noted approximately 1.3 cm partially exophytic hyper enhancing left-sided renal lesion worrisome for a renal cell carcinoma. 3. Extensive colonic diverticulosis without evidence of diverticulitis. 4. Moderate amount of mixed calcified and noncalcified atherosclerotic plaque within a normal caliber abdominal aorta, not resulting in hemodynamically significant stenosis. 5. Cholelithiasis without evidence cholecystitis.  Electronically Signed   By: Sandi Mariscal M.D.   On: 06/30/2015 22:48    Microbiology: Recent Results (from the past 240 hour(s))  Urine culture     Status: Abnormal   Collection Time: 06/30/15  9:07 PM  Result Value Ref Range Status   Specimen Description URINE, CATHETERIZED  Final   Special Requests NONE  Final   Culture 20,000 COLONIES/mL KLEBSIELLA PNEUMONIAE (A)  Final     Report Status 07/03/2015 FINAL  Final   Organism ID, Bacteria KLEBSIELLA PNEUMONIAE (A)  Final      Susceptibility   Klebsiella pneumoniae - MIC*    AMPICILLIN >=32 RESISTANT Resistant     CEFAZOLIN <=4 SENSITIVE Sensitive     CEFTRIAXONE <=1 SENSITIVE Sensitive     CIPROFLOXACIN <=0.25 SENSITIVE Sensitive     GENTAMICIN <=1 SENSITIVE Sensitive     IMIPENEM <=0.25 SENSITIVE Sensitive     NITROFURANTOIN 32 SENSITIVE Sensitive     TRIMETH/SULFA <=20 SENSITIVE Sensitive     AMPICILLIN/SULBACTAM 4 SENSITIVE Sensitive     PIP/TAZO 8 SENSITIVE Sensitive     * 20,000 COLONIES/mL KLEBSIELLA PNEUMONIAE  Blood culture (routine x 2)     Status: Abnormal   Collection Time: 06/30/15  9:24 PM  Result Value Ref Range Status   Specimen Description BLOOD RIGHT ANTECUBITAL  Final   Special Requests BOTTLES DRAWN AEROBIC AND ANAEROBIC 4CC  Final   Culture  Setup Time   Final    GRAM NEGATIVE RODS IN BOTH AEROBIC AND ANAEROBIC BOTTLES Organism ID to follow CRITICAL RESULT CALLED TO, READ BACK BY AND VERIFIED WITH: D WOFFORD,PHARM D AT 0228 07/01/15 BY L BENFIELD Performed at [facility] (A)  Final   Report Status 07/03/2015 FINAL  Final   Organism ID, Bacteria KLEBSIELLA PNEUMONIAE  Final      Susceptibility   Klebsiella pneumoniae - MIC*    AMPICILLIN >=32 RESISTANT Resistant     CEFAZOLIN <=4 SENSITIVE Sensitive     CEFEPIME <=1 SENSITIVE Sensitive     CEFTAZIDIME <=1 SENSITIVE Sensitive     CEFTRIAXONE <=1 SENSITIVE Sensitive     CIPROFLOXACIN <=0.25 SENSITIVE Sensitive     GENTAMICIN <=1 SENSITIVE Sensitive     IMIPENEM <=0.25 SENSITIVE Sensitive     TRIMETH/SULFA <=20 SENSITIVE Sensitive     AMPICILLIN/SULBACTAM 4 SENSITIVE Sensitive     PIP/TAZO 8 SENSITIVE Sensitive     * KLEBSIELLA PNEUMONIAE  Blood Culture ID Panel (Reflexed)     Status: Abnormal   Collection Time: 06/30/15  9:24 PM  Result Value Ref Range Status   Enterococcus species NOT  DETECTED NOT DETECTED Final   Vancomycin resistance NOT DETECTED NOT DETECTED Final   Listeria monocytogenes NOT DETECTED NOT DETECTED Final   Staphylococcus species NOT DETECTED NOT DETECTED Final   Staphylococcus aureus NOT DETECTED NOT DETECTED Final   Methicillin resistance NOT DETECTED NOT DETECTED Final   Streptococcus species NOT DETECTED NOT DETECTED Final   Streptococcus agalactiae NOT DETECTED NOT DETECTED Final   Streptococcus pneumoniae NOT DETECTED NOT DETECTED Final   Streptococcus pyogenes NOT DETECTED NOT DETECTED Final   Acinetobacter baumannii NOT DETECTED NOT DETECTED Final   Enterobacteriaceae species NOT DETECTED NOT DETECTED Final   Enterobacter cloacae complex NOT DETECTED NOT DETECTED Final   Escherichia coli NOT DETECTED NOT DETECTED Final   Klebsiella oxytoca NOT DETECTED NOT DETECTED Final   Klebsiella pneumoniae DETECTED (A) NOT DETECTED Final    Comment: CRITICAL RESULT CALLED  TO, READ BACK BY AND VERIFIED WITH: D WOFFORD,PHARM D AT 0228 07/01/15 BY L BENFIELD    Proteus species NOT DETECTED NOT DETECTED Final   Serratia marcescens NOT DETECTED NOT DETECTED Final   Carbapenem resistance NOT DETECTED NOT DETECTED Final   Haemophilus influenzae NOT DETECTED NOT DETECTED Final   Neisseria meningitidis NOT DETECTED NOT DETECTED Final   Pseudomonas aeruginosa NOT DETECTED NOT DETECTED Final   Candida albicans NOT DETECTED NOT DETECTED Final   Candida glabrata NOT DETECTED NOT DETECTED Final   Candida krusei NOT DETECTED NOT DETECTED Final   Candida parapsilosis NOT DETECTED NOT DETECTED Final   Candida tropicalis NOT DETECTED NOT DETECTED Final    Comment: Performed at [facility]  MRSA PCR Screening     Status: None   Collection Time: 07/01/15  1:51 AM  Result Value Ref Range Status   MRSA by PCR NEGATIVE NEGATIVE Final    Comment:        The GeneXpert MRSA Assay (FDA approved for NASAL specimens only), is one component of a comprehensive MRSA  colonization surveillance program. It is not intended to diagnose MRSA infection nor to guide or monitor treatment for MRSA infections.   Anaerobic culture     Status: None (Preliminary result)   Collection Time: 07/01/15  5:28 AM  Result Value Ref Range Status   Specimen Description KIDNEY RIGHT  Final   Special Requests PATIENT ON FOLLOWING LEVAQUIN/VANC  Final   Gram Stain   Final    ABUNDANT WBC PRESENT,BOTH PMN AND MONONUCLEAR NO ORGANISMS SEEN Performed at [facility]    Culture   Final    NO ANAEROBES ISOLATED; CULTURE IN PROGRESS FOR 5 DAYS   Report Status PENDING  Incomplete  Urine culture     Status: None   Collection Time: 07/01/15  5:28 AM  Result Value Ref Range Status   Specimen Description KIDNEY RIGHT  Final   Special Requests PATIENT ON FOLLOWING LEVAQUIN/VANC  Final   Culture   Final    NO GROWTH 1 DAY Performed at [facility]    Report Status 07/02/2015 FINAL  Final  Gram stain     Status: None   Collection Time: 07/01/15  5:28 AM  Result Value Ref Range Status   Specimen Description KIDNEY RIGHT  Final   Special Requests PATIENT ON FOLLOWING LEVAQUIN/VANC  Final   Gram Stain   Final    ABUNDANT WBC PRESENT,BOTH PMN AND MONONUCLEAR NO ORGANISMS SEEN Performed at [facility]    Report Status 07/01/2015 FINAL  Final  Culture, blood (routine x 2)     Status: None (Preliminary result)   Collection Time: 07/03/15  8:44 AM  Result Value Ref Range Status   Specimen Description BLOOD RIGHT HAND  Final   Special Requests BOTTLES DRAWN AEROBIC AND ANAEROBIC [place]  Final   Culture   Final    NO GROWTH 1 DAY Performed at [facility]    Report Status PENDING  Incomplete  Culture, blood (routine x 2)     Status: None (Preliminary result)   Collection Time: 07/03/15  8:44 AM  Result Value Ref Range Status   Specimen Description BLOOD LEFT HAND  Final   Special Requests IN PEDIATRIC BOTTLE 3CC  Final   Culture   Final    NO  GROWTH 1 DAY Performed at [facility]    Report Status PENDING  Incomplete     Labs: Basic Metabolic Panel:  Recent Labs Lab 07/01/15 0304 07/02/15 0501 07/03/15 0529 07/04/15 0522 07/05/15 0446  NA 141 142 141 143 143  K 3.5 4.0 3.6 3.7 3.9  CL 114* 112* 112* 113* 113*  CO2 18* 18* 21* 22 24  GLUCOSE 152* 107* 90 85 88  BUN 20 22* 17 15 16   CREATININE 1.29* 1.13* 0.74 0.79 0.75  CALCIUM 7.0* 8.1* 7.8* 7.8* 7.8*  MG  --   --   --  1.7 2.0   Liver Function Tests:  Recent Labs Lab 06/30/15 1927 07/01/15 0304  AST 25 32  ALT 14 16  ALKPHOS 101 78  BILITOT 1.0 1.0  PROT 5.9* 4.6*  ALBUMIN 3.3* 2.4*    Recent Labs Lab 06/30/15 1927  LIPASE 23   No results for input(s): AMMONIA in the last 168 hours. CBC:  Recent Labs Lab 06/30/15 1927 07/01/15 0304 07/02/15 0501 07/03/15 0529 07/04/15 0522  WBC 12.9* 15.3* 18.9* 13.3* 7.4  NEUTROABS  --  14.4*  --   --   --   HGB 11.8* 9.7* 11.8* 11.2* 10.9*  HCT 36.3 30.1* 36.2 32.9* 32.1*  MCV 89.6 92.9 91.6 87.5 89.2  PLT 176 129* 156 146* 175   Cardiac Enzymes: No results for input(s): CKTOTAL, CKMB, CKMBINDEX, TROPONINI in the last 168 hours. BNP: BNP (last 3 results) No results for input(s): BNP in the last 8760 hours.  ProBNP (last 3 results) No results for input(s): PROBNP in the last 8760 hours.  CBG: No results for input(s): GLUCAP in the last 168 hours.     SignedFlorencia Reasons MD, PhD  [facility] 07/05/2015, 9:58 AM

## 2015-07-05 NOTE — Progress Notes (Signed)
Occupational Therapy Evaluation Patient Details Name: KOSISOCHUKWU SANDFORD MRN: [id number] DOB: 1927/03/19 Today's Date: 07/05/2015    History of Present Illness 82 yo female admitted with sepsis. S/P R ureteral stent placement 07/01/15. Hx of dementia   Clinical Impression   Pt pleasant and cooperative. Pt unsteady at times during mobility. Unsure if pt has DME at home. Discussed with nursing need for husband to understand that pt will need to mobilize @ RW level with direct S and S with all ADL and use a 3 in 1 as a shower chair. Recommend follow up with [place].     Follow Up Recommendations  Home health OT;Supervision/Assistance - 24 hour (close S with all mobility and ADL)    Equipment Recommendations  3 in 1 bedside comode;Other (comment) (RW (unsure what pt has))    Recommendations for Other Services       Precautions / Restrictions Precautions Precautions: Fall Restrictions Weight Bearing Restrictions: No      Mobility Bed Mobility Overal bed mobility: Modified Independent             General bed mobility comments: use of bedrails  Transfers Overall transfer level: Needs assistance Equipment used: Rolling walker (2 wheeled) Transfers: Sit to/from Stand Sit to Stand: Supervision Stand pivot transfers: Supervision            Balance Overall balance assessment: Needs assistance   Sitting balance-Leahy Scale: Good       Standing balance-Leahy Scale: Fair Standing balance comment: LOB with head turns when walking                            ADL Overall ADL's : Needs assistance/impaired     Grooming: Set up;Standing   Upper Body Bathing: Set up;Sitting   Lower Body Bathing: Set up;Supervison/ safety;Sit to/from stand   Upper Body Dressing : Set up;Sitting   Lower Body Dressing: Minimal assistance;Sit to/from stand   Toilet Transfer: Consulting civil engineer Details (indicate cue type and reason): without RW, pt  minguard Toileting- Clothing Manipulation and Hygiene: Set up       Functional mobility during ADLs: Min guard (without RW. S with RW) General ADL Comments: Pt states " i think I have a walker but i didn't use one"     Vision     Perception     Praxis      Pertinent Vitals/Pain Pain Assessment: No/denies pain     Hand Dominance Right   Extremity/Trunk Assessment Upper Extremity Assessment Upper Extremity Assessment: Overall WFL for tasks assessed   Lower Extremity Assessment Lower Extremity Assessment: Overall WFL for tasks assessed   Cervical / Trunk Assessment Cervical / Trunk Assessment: Normal   Communication Communication Communication: HOH   Cognition Arousal/Alertness: Awake/alert Behavior During Therapy: WFL for tasks assessed/performed; very pleasant Overall Cognitive Status: No family/caregiver present to determine baseline cognitive functioning; able to complete basic ADL tasks with set up and S Area of Impairment: Memory pt continued to ask if anyone had called her husband. Pt recalled earlier conversation with MD regarding her ability to D/C home today.               General Comments: decreased STM. unsure of baseline   General Comments   Poor orientation in space. Likely related to dementia.     Exercises       Shoulder Instructions      Home Living Family/patient expects to be discharged to:: Private residence Living  Arrangements: Spouse/significant other Available Help at Discharge: Family;Available 24 hours/day Type of Home: House Home Access: Stairs to enter     Home Layout: One level     Bathroom Shower/Tub: Tub/shower unit         Home Equipment: Other (comment) (pt unsure)   Additional Comments: unsure of PLOF/DME-no family present during session      Prior Functioning/Environment Level of Independence: Independent (per pt)             OT Diagnosis: Generalized weakness;Cognitive deficits   OT Problem List:  Decreased strength;Decreased activity tolerance;Impaired balance (sitting and/or standing);Decreased safety awareness;Decreased knowledge of use of DME or AE   OT Treatment/Interventions:      OT Goals(Current goals can be found in the care plan section) Acute Rehab OT Goals Patient Stated Goal: to go home OT Goal Formulation: All assessment and education complete, DC therapy  OT Frequency:     Barriers to D/C:            Co-evaluation              End of Session Equipment Utilized During Treatment: Gait belt;Rolling walker Nurse Communication: Mobility status;Other (comment) (D/C needs)  Activity Tolerance: Patient tolerated treatment well Patient left: in bed;with call bell/phone within reach;with bed alarm set   Time: 1013-1040 OT Time Calculation (min): 27 min Charges:  OT General Charges $OT Visit: 1 Procedure OT Evaluation $OT Eval Moderate Complexity: 1 Procedure OT Treatments $Self Care/Home Management : 8-22 mins G-Codes:    Dolton Shaker,HILLARY August 01, 2015, 10:54 AM   Maurie Boettcher, OTR/L  [phone] 2015-08-01

## 2015-07-05 NOTE — Care Management Note (Addendum)
Case Management Note  Patient Details  Name: Katie Huerta MRN: [id number] Date of Birth: [birth year]  Subjective/Objective:      S/P R ureteral stent placement 07/01/15              Action/Plan: Discharge Planning: AVS reviewed: NCM spoke to pt, son, Geraldo Docker, and husband, [name] at bedside. Offered choice for HH/provided [facility] list. Son agreeable to [facility] for [facility]. Requesting RW and 3n1 for home. Contacted [facility] DME rep for DME for scheduled dc home today. Can afford medications. Contacted [place] Liaison to make aware of dc home.  PCP-VINCENT, [place] Hun MD  Expected Discharge Date: 07/05/2015             Expected Discharge Plan:  [place]  In-House Referral:  NA  Discharge planning Services  CM Consult  Post Acute Care Choice:  Home Health Choice offered to:  Patient, Adult Children  DME Arranged:  3-N-1, Walker rolling DME Agency:  [place]:  RN, PT, Nurse's Aide, Social Work [employer] Agency:  [place]  Status of Service:  Completed, signed off  Medicare Important Message Given:  Yes Date Medicare IM Given:    Medicare IM give by:    Date Additional Medicare IM Given:    Additional Medicare Important Message give by:     If discussed at [place] of Stay Meetings, dates discussed:    Additional Comments:  Erenest Rasher, RN 07/05/2015, 2:22 PM

## 2015-07-06 DIAGNOSIS — Z960 Presence of urogenital implants: Secondary | ICD-10-CM | POA: Diagnosis not present

## 2015-07-06 DIAGNOSIS — I10 Essential (primary) hypertension: Secondary | ICD-10-CM | POA: Diagnosis not present

## 2015-07-06 DIAGNOSIS — N3001 Acute cystitis with hematuria: Secondary | ICD-10-CM | POA: Diagnosis not present

## 2015-07-06 DIAGNOSIS — R7881 Bacteremia: Secondary | ICD-10-CM | POA: Diagnosis not present

## 2015-07-06 DIAGNOSIS — D649 Anemia, unspecified: Secondary | ICD-10-CM | POA: Diagnosis not present

## 2015-07-06 DIAGNOSIS — B961 Klebsiella pneumoniae [K. pneumoniae] as the cause of diseases classified elsewhere: Secondary | ICD-10-CM | POA: Diagnosis not present

## 2015-07-06 DIAGNOSIS — N390 Urinary tract infection, site not specified: Secondary | ICD-10-CM | POA: Diagnosis not present

## 2015-07-06 DIAGNOSIS — A419 Sepsis, unspecified organism: Secondary | ICD-10-CM | POA: Diagnosis not present

## 2015-07-07 ENCOUNTER — Telehealth: Payer: Self-pay | Admitting: *Deleted

## 2015-07-07 DIAGNOSIS — Z960 Presence of urogenital implants: Secondary | ICD-10-CM | POA: Diagnosis not present

## 2015-07-07 DIAGNOSIS — N390 Urinary tract infection, site not specified: Principal | ICD-10-CM

## 2015-07-07 DIAGNOSIS — A419 Sepsis, unspecified organism: Secondary | ICD-10-CM

## 2015-07-07 DIAGNOSIS — I10 Essential (primary) hypertension: Secondary | ICD-10-CM | POA: Diagnosis not present

## 2015-07-07 DIAGNOSIS — N3001 Acute cystitis with hematuria: Secondary | ICD-10-CM | POA: Diagnosis not present

## 2015-07-07 DIAGNOSIS — D649 Anemia, unspecified: Secondary | ICD-10-CM | POA: Diagnosis not present

## 2015-07-07 DIAGNOSIS — R7881 Bacteremia: Secondary | ICD-10-CM | POA: Diagnosis not present

## 2015-07-07 DIAGNOSIS — B961 Klebsiella pneumoniae [K. pneumoniae] as the cause of diseases classified elsewhere: Secondary | ICD-10-CM | POA: Diagnosis not present

## 2015-07-07 LAB — ANAEROBIC CULTURE

## 2015-07-07 NOTE — Telephone Encounter (Signed)
Call Completed and Appointment Scheduled: Yes, Date: 07/13/15 with Dr Dettinger   DISCHARGE INFORMATION Date of Discharge:07/05/15  Dis[facility] Facility: [facility]  Principal Discharge Diagnosis: Sepsis  Patient and/or caregiver is knowledgeable of his/her condition(s) and treatment: Yes  MEDICATION RECONCILIATION Medication list reviewed with patient:Yes Discharge Medications reconciled with home medications.   Patient is able to obtain needed medications:Yes  ACTIVITIES OF DAILY LIVING  Is the patient able to perform his/her own ADLs: No.  Patient is physically able to perform most ADLs but she has dementia. Her husband assists her with everything.  Patient is receiving home health services: Yes.    PATIENT EDUCATION Questions/Concerns Discussed: Spoke with patient's husband. He is helping her with ADLs and reports that she has been eating well. She is a little more drowsy this morning than she has been though. She has a walker to help get around her home. She does forget it at times though and her husband has to remind her. She has dementia and her husband states that her memory is terrible now.   From Discharge Summary: Recommendations for Outpatient Follow-up:  1. F/u with PMD within a week for hospital discharge follow up, repeat cbc/bmp at follow up, pmd to monitor blood pressure control, pmd to follow up on repeat blood culture final result, no growth at discharge. 2. F/u with urology for right ureteral mass and biopsy    CBC/BMP ordered and will be drawn at follow up visit on 07/13/15.

## 2015-07-08 LAB — CULTURE, BLOOD (ROUTINE X 2)
CULTURE: NO GROWTH
Culture: NO GROWTH

## 2015-07-09 DIAGNOSIS — D649 Anemia, unspecified: Secondary | ICD-10-CM | POA: Diagnosis not present

## 2015-07-09 DIAGNOSIS — N390 Urinary tract infection, site not specified: Secondary | ICD-10-CM | POA: Diagnosis not present

## 2015-07-09 DIAGNOSIS — I10 Essential (primary) hypertension: Secondary | ICD-10-CM | POA: Diagnosis not present

## 2015-07-09 DIAGNOSIS — A419 Sepsis, unspecified organism: Secondary | ICD-10-CM | POA: Diagnosis not present

## 2015-07-09 DIAGNOSIS — N3001 Acute cystitis with hematuria: Secondary | ICD-10-CM | POA: Diagnosis not present

## 2015-07-09 DIAGNOSIS — Z960 Presence of urogenital implants: Secondary | ICD-10-CM | POA: Diagnosis not present

## 2015-07-09 DIAGNOSIS — R7881 Bacteremia: Secondary | ICD-10-CM | POA: Diagnosis not present

## 2015-07-09 DIAGNOSIS — B961 Klebsiella pneumoniae [K. pneumoniae] as the cause of diseases classified elsewhere: Secondary | ICD-10-CM | POA: Diagnosis not present

## 2015-07-10 DIAGNOSIS — F0390 Unspecified dementia without behavioral disturbance: Secondary | ICD-10-CM | POA: Diagnosis not present

## 2015-07-10 DIAGNOSIS — A419 Sepsis, unspecified organism: Secondary | ICD-10-CM | POA: Diagnosis not present

## 2015-07-10 DIAGNOSIS — N390 Urinary tract infection, site not specified: Secondary | ICD-10-CM | POA: Diagnosis not present

## 2015-07-10 DIAGNOSIS — Z960 Presence of urogenital implants: Secondary | ICD-10-CM | POA: Diagnosis not present

## 2015-07-10 DIAGNOSIS — D649 Anemia, unspecified: Secondary | ICD-10-CM | POA: Diagnosis not present

## 2015-07-10 DIAGNOSIS — I10 Essential (primary) hypertension: Secondary | ICD-10-CM | POA: Diagnosis not present

## 2015-07-10 DIAGNOSIS — N3001 Acute cystitis with hematuria: Secondary | ICD-10-CM | POA: Diagnosis not present

## 2015-07-10 DIAGNOSIS — R7881 Bacteremia: Secondary | ICD-10-CM | POA: Diagnosis not present

## 2015-07-10 DIAGNOSIS — B961 Klebsiella pneumoniae [K. pneumoniae] as the cause of diseases classified elsewhere: Secondary | ICD-10-CM | POA: Diagnosis not present

## 2015-07-13 ENCOUNTER — Ambulatory Visit (INDEPENDENT_AMBULATORY_CARE_PROVIDER_SITE_OTHER): Payer: Medicare Other | Admitting: Family Medicine

## 2015-07-13 ENCOUNTER — Encounter: Payer: Self-pay | Admitting: Family Medicine

## 2015-07-13 VITALS — BP 110/60 | HR 56 | Temp 98.3°F

## 2015-07-13 DIAGNOSIS — A419 Sepsis, unspecified organism: Secondary | ICD-10-CM

## 2015-07-13 DIAGNOSIS — N390 Urinary tract infection, site not specified: Secondary | ICD-10-CM | POA: Diagnosis not present

## 2015-07-13 DIAGNOSIS — F0390 Unspecified dementia without behavioral disturbance: Secondary | ICD-10-CM | POA: Diagnosis not present

## 2015-07-13 DIAGNOSIS — D649 Anemia, unspecified: Secondary | ICD-10-CM | POA: Diagnosis not present

## 2015-07-13 DIAGNOSIS — R7881 Bacteremia: Secondary | ICD-10-CM | POA: Diagnosis not present

## 2015-07-13 DIAGNOSIS — I10 Essential (primary) hypertension: Secondary | ICD-10-CM | POA: Diagnosis not present

## 2015-07-13 DIAGNOSIS — N3001 Acute cystitis with hematuria: Secondary | ICD-10-CM | POA: Diagnosis not present

## 2015-07-13 DIAGNOSIS — R300 Dysuria: Secondary | ICD-10-CM

## 2015-07-13 DIAGNOSIS — Z960 Presence of urogenital implants: Secondary | ICD-10-CM | POA: Diagnosis not present

## 2015-07-13 DIAGNOSIS — B961 Klebsiella pneumoniae [K. pneumoniae] as the cause of diseases classified elsewhere: Secondary | ICD-10-CM | POA: Diagnosis not present

## 2015-07-13 LAB — MICROSCOPIC EXAMINATION: RBC, UA: >30 /hpf — AB (ref 0–?)

## 2015-07-13 LAB — URINALYSIS, COMPLETE
BILIRUBIN UA: NEGATIVE
Nitrite, UA: NEGATIVE
Urobilinogen, Ur: 0.2 mg/dL (ref 0.2–1.0)
pH, UA: 6 (ref 5.0–7.5)

## 2015-07-14 DIAGNOSIS — D649 Anemia, unspecified: Secondary | ICD-10-CM | POA: Diagnosis not present

## 2015-07-14 DIAGNOSIS — I10 Essential (primary) hypertension: Secondary | ICD-10-CM | POA: Diagnosis not present

## 2015-07-14 DIAGNOSIS — N3001 Acute cystitis with hematuria: Secondary | ICD-10-CM | POA: Diagnosis not present

## 2015-07-14 DIAGNOSIS — N390 Urinary tract infection, site not specified: Secondary | ICD-10-CM | POA: Diagnosis not present

## 2015-07-14 DIAGNOSIS — Z960 Presence of urogenital implants: Secondary | ICD-10-CM | POA: Diagnosis not present

## 2015-07-14 DIAGNOSIS — F0390 Unspecified dementia without behavioral disturbance: Secondary | ICD-10-CM | POA: Diagnosis not present

## 2015-07-14 DIAGNOSIS — R7881 Bacteremia: Secondary | ICD-10-CM | POA: Diagnosis not present

## 2015-07-14 DIAGNOSIS — A419 Sepsis, unspecified organism: Secondary | ICD-10-CM | POA: Diagnosis not present

## 2015-07-14 DIAGNOSIS — B961 Klebsiella pneumoniae [K. pneumoniae] as the cause of diseases classified elsewhere: Secondary | ICD-10-CM | POA: Diagnosis not present

## 2015-07-15 NOTE — Progress Notes (Signed)
BP 110/60 mmHg  Pulse 56  Temp(Src) 98.3 F (36.8 C) (Oral)   Subjective:    Patient ID: Katie Huerta, female    DOB: 1927/10/06, 82 y.o.   MRN: [id number]  HPI: Katie Huerta is a 82 y.o. female presenting on 07/13/2015 for Transitional care   HPI Hospital follow-up/transitional care/UTI with sepsis Patient is here for hospital follow-up/insulin care. She was discharged on 07/05/2015 for [facility] for UTI with sepsis. She was contacted on 07/07/2015 to make the appointment for this today. During her hospitalization she was diagnosed with a urinary tract infection and found to have a possible obstruction that was concerning for possible mass or something else obstructing her ureters. They had to place a stent and put her on antibiotics both inpatient and then sent her home with antibiotics outpatient of which she is finishing the last dose today. She says she still having dysuria and hematuria. she denies any fevers or chills or flank pain any further. She denies any abdominal pain. She does have a follow-up with urology  Relevant past medical, surgical, family and social history reviewed and updated as indicated. Interim medical history since our last visit reviewed. Allergies and medications reviewed and updated.  Review of Systems  Constitutional: Negative for fever and chills.  HENT: Negative for congestion, ear discharge and ear pain.   Eyes: Negative for redness and visual disturbance.  Respiratory: Negative for chest tightness and shortness of breath.   Cardiovascular: Negative for chest pain and leg swelling.  Gastrointestinal: Negative for abdominal pain.  Genitourinary: Positive for dysuria and frequency. Negative for urgency, hematuria, flank pain, decreased urine volume, difficulty urinating and pelvic pain.  Musculoskeletal: Negative for back pain and gait problem.  Skin: Negative for rash.  Neurological: Negative for dizziness, light-headedness  and headaches.  Psychiatric/Behavioral: Negative for behavioral problems and agitation.  All other systems reviewed and are negative.   Per HPI unless specifically indicated above     Medication List       This list is accurate as of: 07/13/15 11:59 PM.  Always use your most recent med list.               aspirin 81 MG tablet  Take 81 mg by mouth daily.     B-12 2000 MCG Tabs  Take 2,000 mcg by mouth daily.     Calcium-Vitamin D3 600-400 MG-UNIT Caps  Take 2 tablets by mouth every morning.     latanoprost 0.005 % ophthalmic solution  Commonly known as:  XALATAN  Place 1 drop into both eyes at bedtime.     metoprolol tartrate 25 MG tablet  Commonly known as:  LOPRESSOR  Take 1 tablet (25 mg total) by mouth 2 (two) times daily.           Objective:    BP 110/60 mmHg  Pulse 56  Temp(Src) 98.3 F (36.8 C) (Oral)  Wt Readings from Last 3 Encounters:  07/04/15 137 lb 9.1 oz (62.4 kg)  05/21/15 130 lb (58.968 kg)  03/25/15 128 lb 3.2 oz (58.151 kg)    Physical Exam  Constitutional: She is oriented to person, place, and time. She appears well-developed and well-nourished. No distress.  Eyes: Conjunctivae and EOM are normal. Pupils are equal, round, and reactive to light.  Cardiovascular: Normal rate, regular rhythm, normal heart sounds and intact distal pulses.   No murmur heard. Pulmonary/Chest: Effort normal and breath sounds normal. No respiratory distress. She has no wheezes.  Abdominal: Soft. Bowel sounds are normal. She exhibits no distension. There is no hepatosplenomegaly. There is no tenderness. There is no rebound and no CVA tenderness.  Musculoskeletal: Normal range of motion. She exhibits no edema or tenderness.  Neurological: She is alert and oriented to person, place, and time. Coordination normal.  Skin: Skin is warm and dry. No rash noted. She is not diaphoretic.  Psychiatric: She has a normal mood and affect. Her behavior is normal.  Nursing note  and vitals reviewed.   Results for orders placed or performed in visit on 07/13/15  Microscopic Examination  Result Value Ref Range   WBC, UA 6-10 (A) 0 -  5 /hpf   RBC, UA >30 (A) 0 -  2 /hpf   Epithelial Cells (non renal) 0-10 0 - 10 /hpf   Bacteria, UA Few (A) None seen/Few  Urinalysis, Complete  Result Value Ref Range   Specific Gravity, UA >1.030 (H) 1.005 - 1.030   pH, UA 6.0 5.0 - 7.5   Color, UA Amber (A) Yellow   Appearance Ur Cloudy (A) Clear   Leukocytes, UA Trace (A) Negative   Protein, UA 3+ (A) Negative/Trace   Glucose, UA Trace (A) Negative   Ketones, UA Trace (A) Negative   RBC, UA 3+ (A) Negative   Bilirubin, UA Negative Negative   Urobilinogen, Ur 0.2 0.2 - 1.0 mg/dL   Nitrite, UA Negative Negative   Microscopic Examination See below:       Assessment & Plan:   Problem List Items Addressed This Visit      Genitourinary   Sepsis due to urinary tract infection ([place]) - Primary     Other   Sepsis ([place])    Other Visit Diagnoses    Dysuria        Relevant Orders    Urinalysis, Complete (Completed)        Follow up plan: Return if symptoms worsen or fail to improve.  Counseling provided for all of the vaccine components Orders Placed This Encounter  Procedures  . Microscopic Examination  . Urinalysis, Complete    Caryl Pina, MD [facility] 07/13/2015, 4:08 PM

## 2015-07-16 DIAGNOSIS — D649 Anemia, unspecified: Secondary | ICD-10-CM | POA: Diagnosis not present

## 2015-07-16 DIAGNOSIS — N390 Urinary tract infection, site not specified: Secondary | ICD-10-CM | POA: Diagnosis not present

## 2015-07-16 DIAGNOSIS — F0390 Unspecified dementia without behavioral disturbance: Secondary | ICD-10-CM | POA: Diagnosis not present

## 2015-07-16 DIAGNOSIS — I10 Essential (primary) hypertension: Secondary | ICD-10-CM | POA: Diagnosis not present

## 2015-07-16 DIAGNOSIS — A419 Sepsis, unspecified organism: Secondary | ICD-10-CM | POA: Diagnosis not present

## 2015-07-16 DIAGNOSIS — B961 Klebsiella pneumoniae [K. pneumoniae] as the cause of diseases classified elsewhere: Secondary | ICD-10-CM | POA: Diagnosis not present

## 2015-07-16 DIAGNOSIS — Z960 Presence of urogenital implants: Secondary | ICD-10-CM | POA: Diagnosis not present

## 2015-07-16 DIAGNOSIS — R7881 Bacteremia: Secondary | ICD-10-CM | POA: Diagnosis not present

## 2015-07-16 DIAGNOSIS — N3001 Acute cystitis with hematuria: Secondary | ICD-10-CM | POA: Diagnosis not present

## 2015-07-21 DIAGNOSIS — F0390 Unspecified dementia without behavioral disturbance: Secondary | ICD-10-CM | POA: Diagnosis not present

## 2015-07-21 DIAGNOSIS — B961 Klebsiella pneumoniae [K. pneumoniae] as the cause of diseases classified elsewhere: Secondary | ICD-10-CM | POA: Diagnosis not present

## 2015-07-21 DIAGNOSIS — R7881 Bacteremia: Secondary | ICD-10-CM | POA: Diagnosis not present

## 2015-07-21 DIAGNOSIS — I10 Essential (primary) hypertension: Secondary | ICD-10-CM | POA: Diagnosis not present

## 2015-07-21 DIAGNOSIS — D649 Anemia, unspecified: Secondary | ICD-10-CM | POA: Diagnosis not present

## 2015-07-21 DIAGNOSIS — A419 Sepsis, unspecified organism: Secondary | ICD-10-CM | POA: Diagnosis not present

## 2015-07-21 DIAGNOSIS — N390 Urinary tract infection, site not specified: Secondary | ICD-10-CM | POA: Diagnosis not present

## 2015-07-21 DIAGNOSIS — N3001 Acute cystitis with hematuria: Secondary | ICD-10-CM | POA: Diagnosis not present

## 2015-07-21 DIAGNOSIS — Z960 Presence of urogenital implants: Secondary | ICD-10-CM | POA: Diagnosis not present

## 2015-07-22 DIAGNOSIS — H43813 Vitreous degeneration, bilateral: Secondary | ICD-10-CM | POA: Diagnosis not present

## 2015-07-22 DIAGNOSIS — H353223 Exudative age-related macular degeneration, left eye, with inactive scar: Secondary | ICD-10-CM | POA: Diagnosis not present

## 2015-07-22 DIAGNOSIS — H353211 Exudative age-related macular degeneration, right eye, with active choroidal neovascularization: Secondary | ICD-10-CM | POA: Diagnosis not present

## 2015-07-29 ENCOUNTER — Ambulatory Visit (INDEPENDENT_AMBULATORY_CARE_PROVIDER_SITE_OTHER): Payer: Medicare Other | Admitting: Family Medicine

## 2015-07-29 DIAGNOSIS — N390 Urinary tract infection, site not specified: Secondary | ICD-10-CM | POA: Diagnosis not present

## 2015-07-29 DIAGNOSIS — B961 Klebsiella pneumoniae [K. pneumoniae] as the cause of diseases classified elsewhere: Secondary | ICD-10-CM

## 2015-07-29 DIAGNOSIS — N3001 Acute cystitis with hematuria: Secondary | ICD-10-CM | POA: Diagnosis not present

## 2015-07-29 DIAGNOSIS — A419 Sepsis, unspecified organism: Secondary | ICD-10-CM | POA: Diagnosis not present

## 2015-08-02 ENCOUNTER — Other Ambulatory Visit: Payer: Self-pay | Admitting: Pediatrics

## 2015-09-14 DIAGNOSIS — D4121 Neoplasm of uncertain behavior of right ureter: Secondary | ICD-10-CM | POA: Diagnosis not present

## 2015-09-14 DIAGNOSIS — N3020 Other chronic cystitis without hematuria: Secondary | ICD-10-CM | POA: Diagnosis not present

## 2015-09-14 DIAGNOSIS — D4102 Neoplasm of uncertain behavior of left kidney: Secondary | ICD-10-CM | POA: Diagnosis not present

## 2015-09-15 ENCOUNTER — Other Ambulatory Visit: Payer: Self-pay | Admitting: Urology

## 2015-09-16 DIAGNOSIS — H353223 Exudative age-related macular degeneration, left eye, with inactive scar: Secondary | ICD-10-CM | POA: Diagnosis not present

## 2015-09-16 DIAGNOSIS — H43813 Vitreous degeneration, bilateral: Secondary | ICD-10-CM | POA: Diagnosis not present

## 2015-09-16 DIAGNOSIS — H353211 Exudative age-related macular degeneration, right eye, with active choroidal neovascularization: Secondary | ICD-10-CM | POA: Diagnosis not present

## 2015-09-18 ENCOUNTER — Encounter (HOSPITAL_BASED_OUTPATIENT_CLINIC_OR_DEPARTMENT_OTHER): Payer: Self-pay | Admitting: *Deleted

## 2015-09-23 ENCOUNTER — Ambulatory Visit (INDEPENDENT_AMBULATORY_CARE_PROVIDER_SITE_OTHER): Payer: Medicare Other | Admitting: Pediatrics

## 2015-09-23 ENCOUNTER — Encounter (HOSPITAL_BASED_OUTPATIENT_CLINIC_OR_DEPARTMENT_OTHER): Payer: Self-pay | Admitting: *Deleted

## 2015-09-23 ENCOUNTER — Encounter: Payer: Self-pay | Admitting: Pediatrics

## 2015-09-23 VITALS — BP 145/76 | HR 42 | Temp 96.9°F | Ht 63.0 in | Wt 130.4 lb

## 2015-09-23 DIAGNOSIS — E538 Deficiency of other specified B group vitamins: Secondary | ICD-10-CM | POA: Diagnosis not present

## 2015-09-23 DIAGNOSIS — R001 Bradycardia, unspecified: Secondary | ICD-10-CM

## 2015-09-23 DIAGNOSIS — N2889 Other specified disorders of kidney and ureter: Secondary | ICD-10-CM | POA: Insufficient documentation

## 2015-09-23 DIAGNOSIS — R319 Hematuria, unspecified: Secondary | ICD-10-CM

## 2015-09-23 DIAGNOSIS — F0390 Unspecified dementia without behavioral disturbance: Secondary | ICD-10-CM | POA: Diagnosis not present

## 2015-09-23 DIAGNOSIS — N289 Disorder of kidney and ureter, unspecified: Secondary | ICD-10-CM | POA: Diagnosis not present

## 2015-09-23 DIAGNOSIS — H9193 Unspecified hearing loss, bilateral: Secondary | ICD-10-CM

## 2015-09-23 NOTE — Progress Notes (Signed)
Subjective:    Patient ID: Katie Huerta, female    DOB: 01-22-1928, 82 y.o.   MRN: [id number]  CC: Follow-up (4 month)   HPI: Katie Huerta is a 82 y.o. female presenting for Follow-up (4 month)  Feeling "great" Doesn't remember what she was in the hospital for recently Has procedure scheduled for this Friday for biopsy of renal mass Here with her husband who provides most of the history Pt says "he can tell you" in answer to many of the questions  No lightheadedness, no dizziness Recently had metoprolol decreased to 25mg  daily from BID while in the hospital  Living alone with husband Husband says he is managing fine for now Daughter-in-law may come up to help out more often if he will let her He is doing most of the cleaning, cooking, remembering their medications His health has been fairly good   Depression screen [facility] 2/9 07/13/2015 05/21/2015 03/25/2015 03/09/2015 03/04/2013  Decreased Interest 0 0 0 0 3  Down, Depressed, Hopeless 0 0 0 0 0  PHQ - 2 Score 0 0 0 0 3  Altered sleeping - - - - 0  Tired, decreased energy - - - - 3  Change in appetite - - - - 3  Feeling bad or failure about yourself  - - - - 0  Trouble concentrating - - - - 2  Moving slowly or fidgety/restless - - - - 0  Suicidal thoughts - - - - 0  PHQ-9 Score - - - - 11     Relevant past medical, surgical, family and social history reviewed and updated as indicated.  Interim medical history since our last visit reviewed. Allergies and medications reviewed and updated.  ROS: Per HPI  History  Smoking Status  . Former Smoker  . Quit date: 10/27/1955  Smokeless Tobacco  . Not on file       Objective:    BP (!) 145/76 (BP Location: Left Arm, Patient Position: Sitting, Cuff Size: Normal)   Pulse (!) 42   Temp (!) 96.9 F (36.1 C) (Oral)   Ht 5\' 3"  (1.6 m)   Wt 130 lb 6.4 oz (59.1 kg)   BMI 23.10 kg/m   Wt Readings from Last 3 Encounters:  09/23/15 130 lb 6.4 oz (59.1 kg)    07/04/15 137 lb 9.1 oz (62.4 kg)  05/21/15 130 lb (59 kg)     Gen: NAD, alert, oriented to person, place, cooperative with exam, NCAT EYES: EOMI, no scleral injection or icterus ENT:  OP without erythema LYMPH: no cervical LAD CV: NRRR, normal S1/S2, no murmur, distal pulses 2+ b/l Resp: CTABL, no wheezes, normal WOB Abd: +BS, soft, NTND.  Ext: No edema, warm MSK: normal muscle bulk     Assessment & Plan:    Katie Huerta was seen today for follow-up multiple med problems, found ot be bradycardic.  Diagnoses and all orders for this visit:  Vitamin B 12 deficiency Continue PO vitamin B 12  Dementia, without behavioral disturbance Husband getting help from family in area Getting long with wife fine  Hearing decreased, bilateral Has hearing aids, not wearing them  Ureteral mass Has cystoscopy scheduled for Friday Taking antibiotics as prescribed for pre-procedure  Hematuria See above  Bradycardia Metoprolol tartrate 25mg  recently decreased from BID to qd, HR 42 this morning, did take medicine Stop metoprolol At next visit recheck BP If elevated will add non-beta blocking agent    Follow up plan: Return in about 4  weeks (around 10/21/2015) for med follow up.  Assunta Found, MD [facility] 09/23/2015, 10:18 AM

## 2015-09-23 NOTE — Patient Instructions (Signed)
Stop metoprolol This medicine can make your heart slow and your heart was too slow in clinic today

## 2015-09-23 NOTE — Progress Notes (Addendum)
SPOKE W/ PT'S SON, BRANTLEY Vea (OFFICE GAVE HIS # SINCE PT HAS DEMENTIA).  BRANTLEY VERBALIZED UNDERSTANDING INSTRUCTIONS. NPO AFTER MN.  ARRIVE AT 0930.  NEEDS ISTAT 8.  CURRENT EKG IN CHART AND EPIC.  BRING INSURANCE CARD AND PHOTO ID.  BRANTLEY STATES HIS FATHER IS 4yrold AND TAKES CARE OF HIS MOTHER AND IS VERY SHARP AND WOULD BE ABLE TO ANSWER HEALTH QUESTIONS.  LM FOR PT HUSBAND TO CALL BACK AND WILL COMPLETE HEALTH HX IN EPIC.

## 2015-09-25 ENCOUNTER — Encounter (HOSPITAL_BASED_OUTPATIENT_CLINIC_OR_DEPARTMENT_OTHER): Payer: Self-pay

## 2015-09-25 ENCOUNTER — Ambulatory Visit (HOSPITAL_BASED_OUTPATIENT_CLINIC_OR_DEPARTMENT_OTHER)
Admission: RE | Admit: 2015-09-25 | Discharge: 2015-09-25 | Disposition: A | Discharge status: Home / Self Care | Payer: Medicare Other | Source: Ambulatory Visit | Attending: Urology | Admitting: Urology

## 2015-09-25 ENCOUNTER — Encounter (HOSPITAL_BASED_OUTPATIENT_CLINIC_OR_DEPARTMENT_OTHER)
Admission: RE | Disposition: A | Discharge status: Home / Self Care | Payer: Self-pay | Source: Ambulatory Visit | Attending: Urology

## 2015-09-25 ENCOUNTER — Ambulatory Visit (HOSPITAL_BASED_OUTPATIENT_CLINIC_OR_DEPARTMENT_OTHER): Payer: Medicare Other | Admitting: Anesthesiology

## 2015-09-25 DIAGNOSIS — Z87891 Personal history of nicotine dependence: Secondary | ICD-10-CM | POA: Insufficient documentation

## 2015-09-25 DIAGNOSIS — M1990 Unspecified osteoarthritis, unspecified site: Secondary | ICD-10-CM | POA: Insufficient documentation

## 2015-09-25 DIAGNOSIS — D4101 Neoplasm of uncertain behavior of right kidney: Secondary | ICD-10-CM | POA: Diagnosis not present

## 2015-09-25 DIAGNOSIS — N1330 Unspecified hydronephrosis: Secondary | ICD-10-CM | POA: Insufficient documentation

## 2015-09-25 DIAGNOSIS — C661 Malignant neoplasm of right ureter: Secondary | ICD-10-CM | POA: Insufficient documentation

## 2015-09-25 DIAGNOSIS — N2889 Other specified disorders of kidney and ureter: Secondary | ICD-10-CM | POA: Diagnosis not present

## 2015-09-25 DIAGNOSIS — F0390 Unspecified dementia without behavioral disturbance: Secondary | ICD-10-CM | POA: Insufficient documentation

## 2015-09-25 HISTORY — DX: Bradycardia, unspecified: R00.1

## 2015-09-25 HISTORY — DX: Personal history of other infectious and parasitic diseases: Z86.19

## 2015-09-25 HISTORY — DX: Unspecified macular degeneration: H35.30

## 2015-09-25 HISTORY — PX: HOLMIUM LASER APPLICATION: SHX5852

## 2015-09-25 HISTORY — DX: Other specified disorders of kidney and ureter: N28.89

## 2015-09-25 HISTORY — DX: Personal history of other specified conditions: Z87.898

## 2015-09-25 HISTORY — DX: Unspecified dementia, unspecified severity, without behavioral disturbance, psychotic disturbance, mood disturbance, and anxiety: F03.90

## 2015-09-25 HISTORY — DX: Unspecified hearing loss, unspecified ear: H91.90

## 2015-09-25 HISTORY — PX: CYSTOSCOPY WITH RETROGRADE PYELOGRAM, URETEROSCOPY AND STENT PLACEMENT: SHX5789

## 2015-09-25 LAB — POCT I-STAT, CHEM 8
BUN: 19 mg/dL (ref 6–20)
CHLORIDE: 107 mmol/L (ref 101–111)
CREATININE: 1.1 mg/dL — AB (ref 0.44–1.00)
Calcium, Ion: 1.2 mmol/L (ref 1.12–1.23)
Glucose, Bld: 91 mg/dL (ref 65–99)
HEMATOCRIT: 41 % (ref 36.0–46.0)
Hemoglobin: 13.9 g/dL (ref 12.0–15.0)
POTASSIUM: 4.1 mmol/L (ref 3.5–5.1)
SODIUM: 141 mmol/L (ref 135–145)
TCO2: 23 mmol/L (ref 0–100)

## 2015-09-25 SURGERY — CYSTOURETEROSCOPY, WITH RETROGRADE PYELOGRAM AND STENT INSERTION
Anesthesia: General | Site: Ureter | Laterality: Right

## 2015-09-25 MED ORDER — EPHEDRINE SULFATE 50 MG/ML IJ SOLN
INTRAMUSCULAR | Status: DC | PRN
  Administered 2015-09-25 (×2): 10 mg via INTRAVENOUS

## 2015-09-25 MED ORDER — KETOROLAC TROMETHAMINE 30 MG/ML IJ SOLN
INTRAMUSCULAR | Status: AC
  Filled 2015-09-25: qty 1

## 2015-09-25 MED ORDER — SODIUM CHLORIDE 0.9 % IR SOLN
Status: DC | PRN
  Administered 2015-09-25: 4000 mL

## 2015-09-25 MED ORDER — GENTAMICIN SULFATE 40 MG/ML IJ SOLN
5.0000 mg/kg | INTRAVENOUS | Status: AC
  Administered 2015-09-25: 280 mg via INTRAVENOUS
  Filled 2015-09-25 (×2): qty 7

## 2015-09-25 MED ORDER — TRAMADOL HCL 50 MG PO TABS: 50.0000 mg | ORAL_TABLET | Freq: Four times a day (QID) | ORAL | Status: DC | PRN

## 2015-09-25 MED ORDER — DEXAMETHASONE SODIUM PHOSPHATE 10 MG/ML IJ SOLN
INTRAMUSCULAR | Status: DC | PRN
  Administered 2015-09-25: 10 mg via INTRAVENOUS

## 2015-09-25 MED ORDER — FENTANYL CITRATE (PF) 100 MCG/2ML IJ SOLN
INTRAMUSCULAR | Status: AC
  Filled 2015-09-25: qty 2

## 2015-09-25 MED ORDER — GENTAMICIN IN SALINE 1.6-0.9 MG/ML-% IV SOLN
80.0000 mg | INTRAVENOUS | Status: DC
  Filled 2015-09-25: qty 50

## 2015-09-25 MED ORDER — STERILE WATER FOR INJECTION IJ SOLN
INTRAMUSCULAR | Status: AC
Start: 2015-09-25 — End: 2015-09-25
  Filled 2015-09-25: qty 10

## 2015-09-25 MED ORDER — LACTATED RINGERS IV SOLN
INTRAVENOUS | Status: DC
  Administered 2015-09-25 (×2): via INTRAVENOUS
  Filled 2015-09-25: qty 1000

## 2015-09-25 MED ORDER — STERILE WATER FOR IRRIGATION IR SOLN
Status: DC | PRN
  Administered 2015-09-25: 500 mL

## 2015-09-25 MED ORDER — ONDANSETRON HCL 4 MG/2ML IJ SOLN
INTRAMUSCULAR | Status: DC | PRN
  Administered 2015-09-25: 4 mg via INTRAVENOUS

## 2015-09-25 MED ORDER — DEXAMETHASONE SODIUM PHOSPHATE 10 MG/ML IJ SOLN
INTRAMUSCULAR | Status: AC
  Filled 2015-09-25: qty 1

## 2015-09-25 MED ORDER — PROPOFOL 500 MG/50ML IV EMUL
INTRAVENOUS | Status: DC | PRN
  Administered 2015-09-25: 120 mL via INTRAVENOUS

## 2015-09-25 MED ORDER — FENTANYL CITRATE (PF) 100 MCG/2ML IJ SOLN
INTRAMUSCULAR | Status: DC | PRN
Start: 2015-09-25 — End: 2015-09-25
  Administered 2015-09-25: 50 ug via INTRAVENOUS

## 2015-09-25 MED ORDER — LIDOCAINE HCL (CARDIAC) 20 MG/ML IV SOLN
INTRAVENOUS | Status: DC | PRN
  Administered 2015-09-25: 60 mg via INTRAVENOUS

## 2015-09-25 MED ORDER — BELLADONNA ALKALOIDS-OPIUM 16.2-60 MG RE SUPP
RECTAL | Status: AC
  Filled 2015-09-25: qty 1

## 2015-09-25 MED ORDER — ONDANSETRON HCL 4 MG/2ML IJ SOLN
INTRAMUSCULAR | Status: AC
  Filled 2015-09-25: qty 2

## 2015-09-25 MED ORDER — IOHEXOL 300 MG/ML  SOLN
INTRAMUSCULAR | Status: DC | PRN
  Administered 2015-09-25: 16 mL
  Administered 2015-09-25: 6 mL

## 2015-09-25 MED ORDER — PROPOFOL 10 MG/ML IV BOLUS
INTRAVENOUS | Status: AC
  Filled 2015-09-25: qty 20

## 2015-09-25 MED ORDER — LIDOCAINE HCL 2 % EX GEL
CUTANEOUS | Status: AC
  Filled 2015-09-25: qty 5

## 2015-09-25 SURGICAL SUPPLY — 44 items
BAG DRAIN URO-CYSTO SKYTR STRL (DRAIN) ×4 IMPLANT
BAG DRN UROCATH (DRAIN) ×2
BASKET DAKOTA 1.9FR 11X120 (BASKET) IMPLANT
BASKET LASER NITINOL 1.9FR (BASKET) ×4 IMPLANT
BASKET STNLS GEMINI 4WIRE 3FR (BASKET) ×2 IMPLANT
BASKET ZERO TIP NITINOL 2.4FR (BASKET) IMPLANT
BSKT STON RTRVL 120 1.9FR (BASKET) ×2
BSKT STON RTRVL GEM 120X11 3FR (BASKET) ×2
BSKT STON RTRVL ZERO TP 2.4FR (BASKET)
CANISTER SUCT LVC 12 LTR MEDI- (MISCELLANEOUS) ×4 IMPLANT
CATH INTERMIT  6FR 70CM (CATHETERS) ×4 IMPLANT
CATH URET 5FR 28IN CONE TIP (BALLOONS)
CATH URET 5FR 28IN OPEN ENDED (CATHETERS) IMPLANT
CATH URET 5FR 70CM CONE TIP (BALLOONS) IMPLANT
CLOTH BEACON ORANGE TIMEOUT ST (SAFETY) ×4 IMPLANT
DRSG TELFA 3X8 NADH (GAUZE/BANDAGES/DRESSINGS) ×8 IMPLANT
ELECT REM PT RETURN 9FT ADLT (ELECTROSURGICAL)
ELECTRODE REM PT RTRN 9FT ADLT (ELECTROSURGICAL) IMPLANT
FIBER LASER FLEXIVA 365 (UROLOGICAL SUPPLIES) IMPLANT
FIBER LASER TRAC TIP (UROLOGICAL SUPPLIES) ×2 IMPLANT
GLOVE BIO SURGEON STRL SZ7.5 (GLOVE) ×4 IMPLANT
GOWN STRL REUS W/ TWL LRG LVL3 (GOWN DISPOSABLE) ×4 IMPLANT
GOWN STRL REUS W/ TWL XL LVL3 (GOWN DISPOSABLE) ×2 IMPLANT
GOWN STRL REUS W/TWL LRG LVL3 (GOWN DISPOSABLE) ×8
GOWN STRL REUS W/TWL XL LVL3 (GOWN DISPOSABLE) ×4
GUIDEWIRE 0.038 PTFE COATED (WIRE) IMPLANT
GUIDEWIRE ANG ZIPWIRE 038X150 (WIRE) ×4 IMPLANT
GUIDEWIRE STR DUAL SENSOR (WIRE) ×4 IMPLANT
IV NS IRRIG 3000ML ARTHROMATIC (IV SOLUTION) ×8 IMPLANT
KIT BALLIN UROMAX 15FX10 (LABEL) IMPLANT
KIT BALLN UROMAX 15FX4 (MISCELLANEOUS) IMPLANT
KIT BALLN UROMAX 26 75X4 (MISCELLANEOUS)
KIT ROOM TURNOVER WOR (KITS) ×4 IMPLANT
MANIFOLD NEPTUNE II (INSTRUMENTS) IMPLANT
PACK CYSTO (CUSTOM PROCEDURE TRAY) ×4 IMPLANT
PAD DRESSING TELFA 3X8 NADH (GAUZE/BANDAGES/DRESSINGS) IMPLANT
SET HIGH PRES BAL DIL (LABEL)
SHEATH ACCESS URETERAL 24CM (SHEATH) ×2 IMPLANT
STENT CONTOUR 8FR X 24 (STENTS) ×2 IMPLANT
SYRINGE 10CC LL (SYRINGE) ×4 IMPLANT
SYRINGE IRR TOOMEY STRL 70CC (SYRINGE) IMPLANT
TUBE CONNECTING 12'X1/4 (SUCTIONS)
TUBE CONNECTING 12X1/4 (SUCTIONS) IMPLANT
TUBE FEEDING 8FR 16IN STR KANG (MISCELLANEOUS) IMPLANT

## 2015-09-25 NOTE — Anesthesia Postprocedure Evaluation (Signed)
Anesthesia Post Note  Patient: Katie Huerta  Procedure(s) Performed: Procedure(s) (LRB): CYSTOSCOPY WITH BILATERAL RETROGRADE PYELOGRAM, RIGHT URETEROSCOPY WITH BIOPSY AND RIGHT  STENT EXCHANGE (Bilateral) LASER ABLATION OF URETERAL MASS (Right)  Anesthesia Post Evaluation  Last Vitals:  Vitals:   09/25/15 0940  BP: (!) 141/53  Pulse: (!) 59  Resp: 12  Temp: 36.7 C    Last Pain:  Vitals:   09/25/15 0940  TempSrc: Oral                 Asser Lucena F

## 2015-09-25 NOTE — Brief Op Note (Signed)
09/25/2015  11:50 AM  PATIENT:  Katie Huerta  82 y.o. female  PRE-OPERATIVE DIAGNOSIS:  RIGHT URETERAL MASS  POST-OPERATIVE DIAGNOSIS:  RIGHT URETERAL MASS  PROCEDURE:  Procedure(s): CYSTOSCOPY WITH BILATERAL RETROGRADE PYELOGRAM, RIGHT URETEROSCOPY WITH BIOPSY AND RIGHT  STENT EXCHANGE (Bilateral) LASER ABLATION OF URETERAL MASS (Right)  SURGEON:  Surgeon(s) and Role:    * Alexis Frock, MD - Primary  PHYSICIAN ASSISTANT:   ASSISTANTS: none   ANESTHESIA:   general  EBL:  Total I/O In: 300 [I.V.:300] Out: -   BLOOD ADMINISTERED:none  DRAINS: none   LOCAL MEDICATIONS USED:  NONE  SPECIMEN:  Source of Specimen:  Rt ureteral mass biopsy  DISPOSITION OF SPECIMEN:  PATHOLOGY  COUNTS:  YES  TOURNIQUET:  * No tourniquets in log *  DICTATION: .Other Dictation: Dictation Number [id number]  PLAN OF CARE: Discharge to home after PACU  PATIENT DISPOSITION:  PACU - hemodynamically stable.   Delay start of Pharmacological VTE agent (>24hrs) due to surgical blood loss or risk of bleeding: yes

## 2015-09-25 NOTE — Discharge Instructions (Signed)
[facility] °[phone] °Post Ureteroscopy With or Without Stent Instructions ° °Definitions: ° °Ureter: The duct that transports urine from the kidney to the bladder. °Stent:   A plastic hollow tube that is placed into the ureter, from the kidney to the                 bladder to prevent the ureter from swelling shut. ° °GENERAL INSTRUCTIONS: ° °Despite the fact that no skin incisions were used, the area around the ureter and bladder is raw and irritated. The stent is a foreign body which will further irritate the bladder wall. This irritation is manifested by increased frequency of urination, both day and night, and by an increase in the urge to urinate. In some, the urge to urinate is present almost always. Sometimes the urge is strong enough that you may not be able to stop yourself from urinating. The only real cure is to remove the stent and then give time for the bladder wall to heal which can't be done until the danger of the ureter swelling shut has passed, which varies. ° °You may see some blood in your urine while the stent is in place and a few days afterwards. Do not be alarmed, even if the urine was clear for a while. Get off your feet and drink lots of fluids until clearing occurs. If you start to pass clots or don't improve, call us. ° °DIET: °You may return to your normal diet immediately. Because of the raw surface of your bladder, alcohol, spicy foods, acid type foods and drinks with caffeine may cause irritation or frequency and should be used in moderation. To keep your urine flowing freely and to avoid constipation, drink plenty of fluids during the day ( 8-10 glasses ). °Tip: Avoid cranberry juice because it is very acidic. ° °ACTIVITY: °Your physical activity doesn't need to be restricted. However, if you are very active, you may see some blood in your urine. We suggest that you reduce your activity under these circumstances until the bleeding has stopped. ° °BOWELS: °It is  important to keep your bowels regular during the postoperative period. Straining with bowel movements can cause bleeding. A bowel movement every other day is reasonable. Use a mild laxative if needed, such as Milk of Magnesia 2-3 tablespoons, or 2 Dulcolax tablets. Call if you continue to have problems. If you have been taking narcotics for pain, before, during or after your surgery, you may be constipated. Take a laxative if necessary. ° ° °MEDICATION: °You should resume your pre-surgery medications unless told not to. In addition you will often be given an antibiotic to prevent infection. These should be taken as prescribed until the bottles are finished unless you are having an unusual reaction to one of the drugs. ° °PROBLEMS YOU SHOULD REPORT TO US: °· Fevers over 100.5 Fahrenheit. °· Heavy bleeding, or clots ( See above notes about blood in urine ). °· Inability to urinate. °· Drug reactions ( hives, rash, nausea, vomiting, diarrhea ). °· Severe burning or pain with urination that is not improving. ° °FOLLOW-UP: °You will need a follow-up appointment to monitor your progress. Call for this appointment at the number listed above. Usually the first appointment will be about three to fourteen days after your surgery. ° ° ° ° ° °Post Anesthesia Home Care Instructions ° °Activity: °Get plenty of rest for the remainder of the day. A responsible adult should stay with you for 24 hours following the procedure.  °  any medication unless instructed by your physician -Make any legal decisions or sign important papers.  Meals: Start with liquid foods such as gelatin or soup. Progress to regular foods as tolerated. Avoid greasy, spicy, heavy foods. If nausea and/or vomiting occur, drink only clear liquids until the nausea and/or vomiting subsides. Call your physician if vomiting continues.  Special Instructions/Symptoms: Your throat  may feel dry or sore from the anesthesia or the breathing tube placed in your throat during surgery. If this causes discomfort, gargle with warm salt water. The discomfort should disappear within 24 hours.  If you had a scopolamine patch placed behind your ear for the management of post- operative nausea and/or vomiting:  1. The medication in the patch is effective for 72 hours, after which it should be removed.  Wrap patch in a tissue and discard in the trash. Wash hands thoroughly with soap and water. 2. You may remove the patch earlier than 72 hours if you experience unpleasant side effects which may include dry mouth, dizziness or visual disturbances. 3. Avoid touching the patch. Wash your hands with soap and water after contact with the patch.   1 - You may have urinary urgency (bladder spasms) and bloody urine on / off with stent in place. This is normal.  2 - Call MD or go to ER for fever >102, severe pain / nausea / vomiting not relieved by medications, or acute change in medical status  

## 2015-09-25 NOTE — Anesthesia Procedure Notes (Signed)
Procedure Name: LMA Insertion Date/Time: 09/25/2015 10:56 AM Performed by: Wanita Chamberlain Pre-anesthesia Checklist: Patient identified, Emergency Drugs available, Suction available, Patient being monitored and Timeout performed Patient Re-evaluated:Patient Re-evaluated prior to inductionOxygen Delivery Method: Circle system utilized Preoxygenation: Pre-oxygenation with 100% oxygen Intubation Type: IV induction Ventilation: Mask ventilation without difficulty LMA: LMA inserted LMA Size: 4.0 Number of attempts: 1 Placement Confirmation: positive ETCO2 and breath sounds checked- equal and bilateral Tube secured with: Tape Dental Injury: Teeth and Oropharynx as per pre-operative assessment

## 2015-09-25 NOTE — Anesthesia Preprocedure Evaluation (Addendum)
Anesthesia Evaluation  Patient identified by MRN, date of birth, ID band Patient awake    Reviewed: Allergy & Precautions, NPO status , Patient's Chart, lab work & pertinent test results  Airway Mallampati: II   Neck ROM: full    Dental  (+) Teeth Intact, Dental Advisory Given, Partial Lower,    Pulmonary former smoker,    breath sounds clear to auscultation       Cardiovascular Exercise Tolerance: Poor negative cardio ROS   Rhythm:regular Rate:Normal     Neuro/Psych dementia    GI/Hepatic   Endo/Other    Renal/GU    Right ureteral mass    Musculoskeletal  (+) Arthritis , Osteoarthritis,    Abdominal   Peds  Hematology  (+) anemia ,   Anesthesia Other Findings   Reproductive/Obstetrics                            Anesthesia Physical Anesthesia Plan  ASA: II  Anesthesia Plan: General   Post-op Pain Management:    Induction: Intravenous  Airway Management Planned: LMA  Additional Equipment:   Intra-op Plan:   Post-operative Plan:   Informed Consent: I have reviewed the patients History and Physical, chart, labs and discussed the procedure including the risks, benefits and alternatives for the proposed anesthesia with the patient or authorized representative who has indicated his/her understanding and acceptance.   Dental advisory given  Plan Discussed with: CRNA, Anesthesiologist and Surgeon  Anesthesia Plan Comments:        Anesthesia Quick Evaluation

## 2015-09-25 NOTE — H&P (Signed)
Katie Huerta is an 82 y.o. female.    Chief Complaint: Pre-op RIGHT ureteroscopy / possible biopsy.  HPI:   1 - Right Hydronephrosis / Suspect Ureteral Neoplasm - mod-severe right hydro to mid ureter / iliac crossing to level of enhancing soft tissue density concerning for ureteral neoplasm by CT 06/2015. Remote smoker. Cr <1.5. She admits to hematuria for few weeks. 1 artery 2 vein right renovascular anatomy. Undewent emergent ureteral stent placment 07/01/15 for renal deompression in setting of sepsis.   2 - Small Left Solid Enhancing Renal Mass - 1.3 cm left anterior mid 50% exophytic mass incidental by ER CT 06/2015. 1 artery / 1 vein left renovascular anatomy. No additional cortical lesions.   3 - History or Urosepsis - Pt with fevers, tachycardia, lactic acidosis and bacteruria / pyuria consistent with likely urosepsis. BCX, [place] 5/3 with Klebsiella sens bactrim, cipro.   PMH sig for dementia, appy, benign hyst. She lives independantly with her husband Katie Huerta. Her son Katie Huerta is very involved at [phone]) Her PCP is Katie Pina MD with [facility].   Today "[name]" is seen to proceed with right ureteroscopy to rule out intraluminal mass. No interval fevers. She has been on Levaquin pre-op according to most recent CX's.    Past Medical History:  Diagnosis Date  . Bradycardia   . Dementia   . History of palpitations   . History of sepsis    07-13-2015  urosepsis  . History of syncope    2013--  per documentation in epic by cardiologist-- dr hochrein -  negative work-up, felt to be vagal episode  . HOH (hard of hearing)    pt want wear wearing aids  . Hydronephrosis, right   . Left renal mass    small solid noted on ct 05/ 2017  . Macular degeneration of both eyes    treatment w/ drops and injection's  . Ureteral mass    right     Past Surgical History:  Procedure Laterality Date  . APPENDECTOMY  as teen  . CATARACT EXTRACTION W/ INTRAOCULAR LENS   IMPLANT, BILATERAL  2005  . CYSTOSCOPY W/ URETERAL STENT PLACEMENT Right 07/01/2015   Procedure: CYSTOSCOPY WITH RETROGRADE PYELOGRAM/URETERAL STENT PLACEMENT;  Surgeon: Alexis Frock, MD;  Location: [facility];  Service: Urology;  Laterality: Right;  . REFRACTIVE SURGERY Bilateral left 2009/  right 2011  . TRANSTHORACIC ECHOCARDIOGRAM  07-04-2015   severe LVH,  ef 60-65%,  grade 1 diastolic dysfunction/  trivial AR and MR/  mild LAE/  mild TR/  mild increase PASP 8mmHg  . VAGINAL HYSTERECTOMY  yrs ago   w/  Bilateral Salpingoophorectomy    Family History  Problem Relation Age of Onset  . Coronary artery disease Father 19    Died 90s of an MI  . Coronary artery disease Brother 65  . Coronary artery disease Sister 1   Social History:  reports that she quit smoking about 59 years ago. She quit after 10.00 years of use. She has never used smokeless tobacco. She reports that she does not drink alcohol. Her drug history is not on file.  Allergies:  Allergies  Allergen Reactions  . Penicillins     Unknown childhood reaction    No prescriptions prior to admission.    No results found for this or any previous visit (from the past 48 hour(s)). No results found.  Review of Systems  Constitutional: Negative.  Negative for chills and fever.  Eyes: Negative.   Respiratory:  Negative.   Cardiovascular: Negative.   Gastrointestinal: Negative.   Genitourinary: Negative.   Musculoskeletal: Negative.   Skin: Negative.   Neurological: Negative.   Endo/Heme/Allergies: Negative.   Psychiatric/Behavioral: Negative.     There were no vitals taken for this visit. Physical Exam  Constitutional: She appears well-developed.  HENT:  Head: Normocephalic.  Eyes: Pupils are equal, round, and reactive to light.  Neck: Normal range of motion.  Cardiovascular: Normal rate.   Respiratory: Effort normal.  GI: Soft.  Genitourinary:  Genitourinary Comments: No CVAT  Musculoskeletal: Normal range of  motion.  Neurological: She is alert.  Skin: Skin is warm.  Psychiatric: She has a normal mood and affect. Her behavior is normal. Judgment and thought content normal.     Assessment/Plan  Proceed as planned with diagnostic ureteroscopy / possible biopsy with goal of ruling our intraluminal ureteral mass. Risks, benefits, alternatives, expected per-op course, need for likley temporary continued ureteral stent discussed.   Alexis Frock, MD 09/25/2015, 6:14 AM

## 2015-09-25 NOTE — Transfer of Care (Signed)
Immediate Anesthesia Transfer of Care Note  Patient: Timmothy Euler  Procedure(s) Performed: Procedure(s): CYSTOSCOPY WITH BILATERAL RETROGRADE PYELOGRAM, RIGHT URETEROSCOPY WITH BIOPSY AND RIGHT  STENT EXCHANGE (Bilateral) LASER ABLATION OF URETERAL MASS (Right)  Patient Location: PACU  Anesthesia Type:General  Level of Consciousness: awake, alert , oriented and patient cooperative  Airway & Oxygen Therapy: Patient Spontanous Breathing and Patient connected to nasal cannula oxygen  Post-op Assessment: Report given to RN and Post -op Vital signs reviewed and stable  Post vital signs: Reviewed and stable  Last Vitals:  Vitals:   09/25/15 0940 09/25/15 1202  BP: (!) 141/53   Pulse: (!) 59   Resp: 12 (P) 17  Temp: 36.7 C (P) 36.4 C    Last Pain:  Vitals:   09/25/15 0940  TempSrc: Oral      Patients Stated Pain Goal: 7 ([id number])  Complications: No apparent anesthesia complications

## 2015-09-28 ENCOUNTER — Encounter (HOSPITAL_BASED_OUTPATIENT_CLINIC_OR_DEPARTMENT_OTHER): Payer: Self-pay | Admitting: Urology

## 2015-09-28 NOTE — Op Note (Signed)
NAME:  Katie Huerta, Katie Huerta               ACCOUNT NO.:  MEDICAL RECORD NO.:  [id number]  LOCATION:                                 FACILITY:  PHYSICIAN:  Alexis Frock, MD     DATE OF BIRTH:  Oct 02, 1927  DATE OF PROCEDURE: 09/25/2015                              OPERATIVE REPORT   DIAGNOSIS:  History of right hydronephrosis and likely right ureteral mass.  PROCEDURES: 1. Cystoscopy with bilateral retrograde pyelogram and interpretation. 2. Right ureteroscopy with biopsy of ureteral mass. 3. Laser ablation of right ureteral mass. 4. Exchange of right ureteral stent, 8 x 24 Contour, no tether.  ESTIMATED BLOOD LOSS:  Nil.  COMPLICATION:  None.  SPECIMENS:  Right ureteral mass fragments for permanent pathology.  FINDINGS: 1. Moderate hydronephrosis to filling defect in the mid-to-distal     ureter very close to the iliac crossing. 2. 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 3. Ablation of obstructing component intraluminal of right midureteral     tumor following laser ablation, photodocumentation performed.  INDICATIONS:  Katie Huerta is a very pleasant 82 year old lady with history of mild dementia.  She originally presented several months ago with urosepsis and hydronephrosis on the right and questionable right ureteral and periureteral mass.  She underwent stenting at that time for renal decompression, intravenous antibiotics and has since cleared her infectious parameters.  Options were discussed for further characterization including ureteroscopic biopsy to help confirm of rule out intraluminal mass and she wished to proceed.  Most recent culture since the Levaquin, which she has been on preoperatively without systemic infectious parameters.  Informed consent was obtained and placed in the medical record.  PROCEDURE IN DETAIL:  The patient  being Katie Huerta, was verified.  Procedure being cysto, bilateral retrogrades, right ureteroscopy with biopsy and laser ablation of the mass was confirmed. Procedure was carried out.  Time-out was performed.  Intravenous antibiotics were administered.  General LMA anesthesia was introduced. The patient was placed into a medium lithotomy position and sterile field was created by prepping and draping the patient's vagina, introitus and proximal thighs using iodine x3.  Next, cystourethroscopy was performed using a 23-French rigid cystoscope with offset lens. Inspection of the urinary bladder revealed no diverticula, calcifications or papular lesions.  The left ureteral orifice was cannulated with a 6-French end-hole catheter and left retrograde pyelogram was obtained.  Left retrograde pyelogram demonstrated a single left ureter with a single system left kidney.  No filling defects or narrowing noted.  The distal end of right ureteral stent was in situ.  There was mild-to- moderate encrustation and it was grasped, brought to the level of the urethral meatus, through which, a 0.038 Zip wire was advanced to the level of the upper pole.  The stent was exchanged for a 6-French open- ended catheter and a right retrograde pyelogram was obtained.  Right retrograde pyelogram demonstrated a single right ureter with single-system right kidney.  There was mild-to-moderate hydronephrosis to multifocal filling defects in the mid-to-distal ureter very close to the iliac crossing consistent with likely intraluminal component of  mass.  The zip wire was once again advanced, set aside as a safety wire. An 8-French feeding tube was placed in the urinary bladder for pressure release.  Next, semi-rigid ureteroscopy was performed to the distal two- thirds of the right ureter alongside a separate Sensor working wire.  As expected, there was significant narrowing with mucosal erythema and some papillary  changes in the area of the midureter close to the iliac crossing.  Rigid ureteroscopic biopsy forceps were used to obtain representative small samples of this tissue, set aside for permanent pathology.  Next, the semi-rigid scope was exchanged for 12/14 ureteral access sheath at the level of the distal ureter using fluoroscopic guidance and flexible digital ureteroscopy was performed of the entire length of right ureter alongside the safety wire.  Inspection of all calices revealed no renal component of her tumor, but ureteroscopic vision of the same area of the ureter near the iliac crossing again revealed likely papillary tumor, photodocumentation was performed. Additional fragments were obtained using a snaring technique with a Gemini-type basket obtaining additional fragments, set aside with the prior right ureteral mass biopsy.  Next, 200 nanometer fiber was used and holmium laser energy applied at the settings of 1.0 joules and 6 hertz and laser ablation was performed using a noncontact technique of all visible intraluminal tumor, taking exquisite care not to apply energy directly to the wall as this was very close to the area of the iliac vessels.  Following this maneuver, there was excellent hemostasis. All sponge and needle counts were correct.  There was no obvious direct ureteral perforation.  The access sheath was removed under continuous vision and a new 8 x 24 Contour-type stent was placed over the remaining safety wire using fluoroscopic guidance.  Good proximal and distal deployment were noted and procedure was terminated.  The patient tolerated the procedure well.  There were no immediate periprocedural complications.  The patient was taken to the Postanesthesia Care Unit in stable condition.          ______________________________ Alexis Frock, MD     TM/MEDQ  D:  09/25/2015  T:  09/26/2015  Job:  [id number]

## 2015-10-15 ENCOUNTER — Other Ambulatory Visit: Payer: Self-pay | Admitting: Urology

## 2015-10-15 DIAGNOSIS — N3020 Other chronic cystitis without hematuria: Secondary | ICD-10-CM | POA: Diagnosis not present

## 2015-10-15 DIAGNOSIS — D4121 Neoplasm of uncertain behavior of right ureter: Secondary | ICD-10-CM | POA: Diagnosis not present

## 2015-10-15 DIAGNOSIS — D4102 Neoplasm of uncertain behavior of left kidney: Secondary | ICD-10-CM | POA: Diagnosis not present

## 2015-10-16 ENCOUNTER — Other Ambulatory Visit: Payer: Self-pay | Admitting: Urology

## 2015-11-09 NOTE — Patient Instructions (Addendum)
Katie Huerta  11/09/2015   Your procedure is scheduled on: Wednesday 11/18/2015             Report to [facility]  Entrance take [facility]  elevators to 3rd floor to  [place] at  1100 AM.  Call this number if you have problems the morning of surgery [phone]   Remember: ONLY 1 PERSON MAY GO WITH YOU TO SHORT STAY TO GET  READY MORNING OF [place].              FOLLOW BOWEL INSTRUCTIONS FROM DR. MANNY'S OFFICE ALONG WITH A CLEAR LIQUID DIET THE DAY BEFORE SURGERY!               DRINK THE MAGNESIUM CITRATE BY 12 NOON THE DAY BEFORE SURGERY ON Tuesday  11/17/2015!   Do not eat food  :After Midnight.  MAY HAVE CLEAR LIQUIDS FROM MIDNIGHT UP TO 0700 AM MORNING OF SURGERY AND THEN NOTHING UNTIL AFTER SURGERY!   CLEAR LIQUID DIET   Foods Allowed                                                                     Foods Excluded  Coffee and tea, regular and decaf                             liquids that you cannot  Plain Jell-O in any flavor                                             see through such as: Fruit ices (not with fruit pulp)                                     milk, soups, orange juice  Iced Popsicles                                    All solid food Carbonated beverages, regular and diet                                    Cranberry, grape and apple juices Sports drinks like Gatorade Lightly seasoned clear broth or consume(fat free) Sugar, honey syrup  Sample Menu Breakfast                                Lunch                                     Supper Cranberry juice                    Beef broth  Chicken broth Jell-O                                     Grape juice                           Apple juice Coffee or tea                        Jell-O                                      Popsicle                                                Coffee or tea                        Coffee or  tea  _____________________________________________________________________       Take these medicines the morning of surgery with A SIP OF WATER: none                                You may not have any metal on your body including hair pins and              piercings  Do not wear jewelry, make-up, lotions, powders or perfumes, deodorant             Do not wear nail polish.  Do not shave  48 hours prior to surgery.              Men may shave face and neck.   Do not bring valuables to the hospital. [place].  Contacts, dentures or bridgework may not be worn into surgery.  Leave suitcase in the car. After surgery it may be brought to your room.                  Please read over the following fact sheets you were given: _____________________________________________________________________             [facility] - Preparing for Surgery Before surgery, you can play an important role.  Because skin is not sterile, your skin needs to be as free of germs as possible.  You can reduce the number of germs on your skin by washing with CHG (chlorahexidine gluconate) soap before surgery.  CHG is an antiseptic cleaner which kills germs and bonds with the skin to continue killing germs even after washing. Please DO NOT use if you have an allergy to CHG or antibacterial soaps.  If your skin becomes reddened/irritated stop using the CHG and inform your nurse when you arrive at Short Stay. Do not shave (including legs and underarms) for at least 48 hours prior to the first CHG shower.  You may shave your face/neck. Please follow these instructions carefully:  1.  Shower with CHG Soap the night before surgery and the  morning of Surgery.  2.  If you  choose to wash your hair, wash your hair first as usual with your  normal  shampoo.  3.  After you shampoo, rinse your hair and body thoroughly to remove the  shampoo.                           4.  Use CHG  as you would any other liquid soap.  You can apply chg directly  to the skin and wash                       Gently with a scrungie or clean washcloth.  5.  Apply the CHG Soap to your body ONLY FROM THE NECK DOWN.   Do not use on face/ open                           Wound or open sores. Avoid contact with eyes, ears mouth and genitals (private parts).                       Wash face,  Genitals (private parts) with your normal soap.             6.  Wash thoroughly, paying special attention to the area where your surgery  will be performed.  7.  Thoroughly rinse your body with warm water from the neck down.  8.  DO NOT shower/wash with your normal soap after using and rinsing off  the CHG Soap.                9.  Pat yourself dry with a clean towel.            10.  Wear clean pajamas.            11.  Place clean sheets on your bed the night of your first shower and do not  sleep with pets. Day of Surgery : Do not apply any lotions/deodorants the morning of surgery.  Please wear clean clothes to the hospital/surgery center.  FAILURE TO FOLLOW THESE INSTRUCTIONS MAY RESULT IN THE CANCELLATION OF YOUR SURGERY PATIENT SIGNATURE_________________________________  NURSE SIGNATURE__________________________________  ________________________________________________________________________   Adam Phenix  An incentive spirometer is a tool that can help keep your lungs clear and active. This tool measures how well you are filling your lungs with each breath. Taking long deep breaths may help reverse or decrease the chance of developing breathing (pulmonary) problems (especially infection) following:  A long period of time when you are unable to move or be active. BEFORE THE PROCEDURE   If the spirometer includes an indicator to show your best effort, your nurse or respiratory therapist will set it to a desired goal.  If possible, sit up straight or lean slightly forward. Try not to  slouch.  Hold the incentive spirometer in an upright position. INSTRUCTIONS FOR USE  1. Sit on the edge of your bed if possible, or sit up as far as you can in bed or on a chair. 2. Hold the incentive spirometer in an upright position. 3. Breathe out normally. 4. Place the mouthpiece in your mouth and seal your lips tightly around it. 5. Breathe in slowly and as deeply as possible, raising the piston or the ball toward the top of the column. 6. Hold your breath for 3-5 seconds or for as long as possible. Allow the piston or ball to fall  to the bottom of the column. 7. Remove the mouthpiece from your mouth and breathe out normally. 8. Rest for a few seconds and repeat Steps 1 through 7 at least 10 times every 1-2 hours when you are awake. Take your time and take a few normal breaths between deep breaths. 9. The spirometer may include an indicator to show your best effort. Use the indicator as a goal to work toward during each repetition. 10. After each set of 10 deep breaths, practice coughing to be sure your lungs are clear. If you have an incision (the cut made at the time of surgery), support your incision when coughing by placing a pillow or rolled up towels firmly against it. Once you are able to get out of bed, walk around indoors and cough well. You may stop using the incentive spirometer when instructed by your caregiver.  RISKS AND COMPLICATIONS  Take your time so you do not get dizzy or light-headed.  If you are in pain, you may need to take or ask for pain medication before doing incentive spirometry. It is harder to take a deep breath if you are having pain. AFTER USE  Rest and breathe slowly and easily.  It can be helpful to keep track of a log of your progress. Your caregiver can provide you with a simple table to help with this. If you are using the spirometer at home, follow these instructions: [place] IF:   You are having difficultly using the spirometer.  You  have trouble using the spirometer as often as instructed.  Your pain medication is not giving enough relief while using the spirometer.  You develop fever of 100.5 F (38.1 C) or higher. SEEK IMMEDIATE MEDICAL CARE IF:   You cough up bloody sputum that had not been present before.  You develop fever of 102 F (38.9 C) or greater.  You develop worsening pain at or near the incision site. MAKE SURE YOU:   Understand these instructions.  Will watch your condition.  Will get help right away if you are not doing well or get worse. Document Released: 06/27/2006 Document Revised: 05/09/2011 Document Reviewed: 08/28/2006 [facility] Patient Information 2014 [place], [place].   ________________________________________________________________________

## 2015-11-09 NOTE — Progress Notes (Signed)
06/30/2015- noted in EPIC- EKG and CXR 07/04/2015-noted in EPIC- ECHO

## 2015-11-10 ENCOUNTER — Encounter (HOSPITAL_COMMUNITY): Payer: Self-pay

## 2015-11-10 ENCOUNTER — Encounter (HOSPITAL_COMMUNITY)
Admission: RE | Admit: 2015-11-10 | Discharge: 2015-11-10 | Disposition: A | Discharge status: Home / Self Care | Payer: Medicare Other | Source: Ambulatory Visit | Attending: Urology | Admitting: Urology

## 2015-11-10 DIAGNOSIS — Z01812 Encounter for preprocedural laboratory examination: Secondary | ICD-10-CM | POA: Insufficient documentation

## 2015-11-10 DIAGNOSIS — C661 Malignant neoplasm of right ureter: Secondary | ICD-10-CM | POA: Diagnosis not present

## 2015-11-10 LAB — CBC
HEMATOCRIT: 40.4 % (ref 36.0–46.0)
HEMOGLOBIN: 12.9 g/dL (ref 12.0–15.0)
MCH: 29.5 pg (ref 26.0–34.0)
MCHC: 31.9 g/dL (ref 30.0–36.0)
MCV: 92.4 fL (ref 78.0–100.0)
Platelets: 259 10*3/uL (ref 150–400)
RBC: 4.37 MIL/uL (ref 3.87–5.11)
RDW: 14.2 % (ref 11.5–15.5)
WBC: 7.3 10*3/uL (ref 4.0–10.5)

## 2015-11-10 LAB — BASIC METABOLIC PANEL
ANION GAP: 6 (ref 5–15)
BUN: 23 mg/dL — AB (ref 6–20)
CHLORIDE: 108 mmol/L (ref 101–111)
CO2: 28 mmol/L (ref 22–32)
Calcium: 8.8 mg/dL — ABNORMAL LOW (ref 8.9–10.3)
Creatinine, Ser: 0.83 mg/dL (ref 0.44–1.00)
GFR calc Af Amer: >60 mL/min (ref >60)
GLUCOSE: 100 mg/dL — AB (ref 65–99)
POTASSIUM: 4.1 mmol/L (ref 3.5–5.1)
Sodium: 142 mmol/L (ref 135–145)

## 2015-11-10 LAB — ABO/RH: ABO/RH(D): O POS

## 2015-11-18 ENCOUNTER — Inpatient Hospital Stay (HOSPITAL_COMMUNITY): Payer: Medicare Other | Admitting: Certified Registered Nurse Anesthetist

## 2015-11-18 ENCOUNTER — Encounter (HOSPITAL_COMMUNITY)
Admission: RE | Disposition: A | Discharge status: Home / Self Care | Payer: Self-pay | Source: Ambulatory Visit | Attending: Urology

## 2015-11-18 ENCOUNTER — Encounter (HOSPITAL_COMMUNITY): Payer: Self-pay | Admitting: *Deleted

## 2015-11-18 ENCOUNTER — Inpatient Hospital Stay (HOSPITAL_COMMUNITY)
Admission: RE | Admit: 2015-11-18 | Discharge: 2015-11-20 | DRG: 658 | Disposition: A | Discharge status: Home / Self Care | Payer: Medicare Other | Source: Ambulatory Visit | Attending: Urology | Admitting: Urology

## 2015-11-18 DIAGNOSIS — Z880 Allergy status to penicillin: Secondary | ICD-10-CM | POA: Diagnosis not present

## 2015-11-18 DIAGNOSIS — Z961 Presence of intraocular lens: Secondary | ICD-10-CM | POA: Diagnosis not present

## 2015-11-18 DIAGNOSIS — Z23 Encounter for immunization: Secondary | ICD-10-CM

## 2015-11-18 DIAGNOSIS — N12 Tubulo-interstitial nephritis, not specified as acute or chronic: Secondary | ICD-10-CM | POA: Diagnosis not present

## 2015-11-18 DIAGNOSIS — Z8249 Family history of ischemic heart disease and other diseases of the circulatory system: Secondary | ICD-10-CM | POA: Diagnosis not present

## 2015-11-18 DIAGNOSIS — C641 Malignant neoplasm of right kidney, except renal pelvis: Secondary | ICD-10-CM | POA: Diagnosis not present

## 2015-11-18 DIAGNOSIS — H3530 Unspecified macular degeneration: Secondary | ICD-10-CM | POA: Diagnosis present

## 2015-11-18 DIAGNOSIS — Z9841 Cataract extraction status, right eye: Secondary | ICD-10-CM

## 2015-11-18 DIAGNOSIS — N2889 Other specified disorders of kidney and ureter: Secondary | ICD-10-CM | POA: Diagnosis not present

## 2015-11-18 DIAGNOSIS — Z90710 Acquired absence of both cervix and uterus: Secondary | ICD-10-CM

## 2015-11-18 DIAGNOSIS — R590 Localized enlarged lymph nodes: Secondary | ICD-10-CM | POA: Diagnosis present

## 2015-11-18 DIAGNOSIS — F0390 Unspecified dementia without behavioral disturbance: Secondary | ICD-10-CM | POA: Diagnosis present

## 2015-11-18 DIAGNOSIS — H9190 Unspecified hearing loss, unspecified ear: Secondary | ICD-10-CM | POA: Diagnosis not present

## 2015-11-18 DIAGNOSIS — C661 Malignant neoplasm of right ureter: Secondary | ICD-10-CM | POA: Diagnosis not present

## 2015-11-18 DIAGNOSIS — K660 Peritoneal adhesions (postprocedural) (postinfection): Secondary | ICD-10-CM | POA: Diagnosis present

## 2015-11-18 DIAGNOSIS — Z87891 Personal history of nicotine dependence: Secondary | ICD-10-CM | POA: Diagnosis not present

## 2015-11-18 DIAGNOSIS — Z9842 Cataract extraction status, left eye: Secondary | ICD-10-CM | POA: Diagnosis not present

## 2015-11-18 HISTORY — PX: ROBOT ASSITED LAPAROSCOPIC NEPHROURETERECTOMY: SHX6077

## 2015-11-18 LAB — HEMOGLOBIN AND HEMATOCRIT, BLOOD
HEMATOCRIT: 37.7 % (ref 36.0–46.0)
HEMOGLOBIN: 12.1 g/dL (ref 12.0–15.0)

## 2015-11-18 LAB — TYPE AND SCREEN
ABO/RH(D): O POS
ANTIBODY SCREEN: NEGATIVE

## 2015-11-18 SURGERY — NEPHROURETERECTOMY, ROBOT-ASSISTED, LAPAROSCOPIC
Anesthesia: General | Laterality: Right

## 2015-11-18 MED ORDER — LIDOCAINE 2% (20 MG/ML) 5 ML SYRINGE
INTRAMUSCULAR | Status: AC
  Filled 2015-11-18: qty 5

## 2015-11-18 MED ORDER — DIPHENHYDRAMINE HCL 12.5 MG/5ML PO ELIX: 12.5000 mg | ORAL_SOLUTION | Freq: Four times a day (QID) | ORAL | Status: DC | PRN

## 2015-11-18 MED ORDER — EPHEDRINE SULFATE 50 MG/ML IJ SOLN
INTRAMUSCULAR | Status: DC | PRN
  Administered 2015-11-18: 5 mg via INTRAVENOUS
  Administered 2015-11-18 (×4): 10 mg via INTRAVENOUS

## 2015-11-18 MED ORDER — ONDANSETRON HCL 4 MG/2ML IJ SOLN
4.0000 mg | INTRAMUSCULAR | Status: DC | PRN
  Administered 2015-11-20: 4 mg via INTRAVENOUS
  Filled 2015-11-18: qty 2

## 2015-11-18 MED ORDER — MAGNESIUM CITRATE PO SOLN: 1.0000 | Freq: Once | ORAL | Status: DC

## 2015-11-18 MED ORDER — ACETAMINOPHEN 500 MG PO TABS
1000.0000 mg | ORAL_TABLET | Freq: Four times a day (QID) | ORAL | Status: AC
  Administered 2015-11-18 – 2015-11-19 (×4): 1000 mg via ORAL
  Filled 2015-11-18 (×4): qty 2

## 2015-11-18 MED ORDER — FENTANYL CITRATE (PF) 100 MCG/2ML IJ SOLN
INTRAMUSCULAR | Status: AC
  Filled 2015-11-18: qty 4

## 2015-11-18 MED ORDER — DEXTROSE-NACL 5-0.45 % IV SOLN
INTRAVENOUS | Status: DC
  Administered 2015-11-18 – 2015-11-19 (×2): via INTRAVENOUS

## 2015-11-18 MED ORDER — DEXAMETHASONE SODIUM PHOSPHATE 10 MG/ML IJ SOLN
INTRAMUSCULAR | Status: AC
  Filled 2015-11-18: qty 1

## 2015-11-18 MED ORDER — OXYCODONE HCL 5 MG/5ML PO SOLN: 5.0000 mg | Freq: Once | ORAL | Status: DC | PRN

## 2015-11-18 MED ORDER — CLINDAMYCIN PHOSPHATE 900 MG/50ML IV SOLN
900.0000 mg | Freq: Once | INTRAVENOUS | Status: AC
  Administered 2015-11-18: 900 mg via INTRAVENOUS

## 2015-11-18 MED ORDER — ROCURONIUM BROMIDE 10 MG/ML (PF) SYRINGE
PREFILLED_SYRINGE | INTRAVENOUS | Status: AC
  Filled 2015-11-18: qty 10

## 2015-11-18 MED ORDER — ONDANSETRON HCL 4 MG/2ML IJ SOLN
INTRAMUSCULAR | Status: AC
  Filled 2015-11-18: qty 2

## 2015-11-18 MED ORDER — FENTANYL CITRATE (PF) 100 MCG/2ML IJ SOLN
INTRAMUSCULAR | Status: DC | PRN
  Administered 2015-11-18 (×4): 50 ug via INTRAVENOUS
  Administered 2015-11-18 (×4): 25 ug via INTRAVENOUS

## 2015-11-18 MED ORDER — LACTATED RINGERS IV SOLN
INTRAVENOUS | Status: DC
  Administered 2015-11-18 (×2): via INTRAVENOUS

## 2015-11-18 MED ORDER — SODIUM CHLORIDE 0.9 % IJ SOLN
INTRAMUSCULAR | Status: AC
  Filled 2015-11-18: qty 50

## 2015-11-18 MED ORDER — LIDOCAINE HCL (CARDIAC) 20 MG/ML IV SOLN
INTRAVENOUS | Status: DC | PRN
  Administered 2015-11-18: 100 mg via INTRAVENOUS

## 2015-11-18 MED ORDER — FENTANYL CITRATE (PF) 100 MCG/2ML IJ SOLN
INTRAMUSCULAR | Status: AC
  Filled 2015-11-18: qty 2

## 2015-11-18 MED ORDER — CIPROFLOXACIN IN D5W 400 MG/200ML IV SOLN
400.0000 mg | Freq: Two times a day (BID) | INTRAVENOUS | Status: DC
  Administered 2015-11-18: 400 mg via INTRAVENOUS

## 2015-11-18 MED ORDER — ONDANSETRON HCL 4 MG/2ML IJ SOLN: 4.0000 mg | Freq: Four times a day (QID) | INTRAMUSCULAR | Status: DC | PRN

## 2015-11-18 MED ORDER — ROCURONIUM BROMIDE 100 MG/10ML IV SOLN
INTRAVENOUS | Status: DC | PRN
  Administered 2015-11-18: 40 mg via INTRAVENOUS
  Administered 2015-11-18 (×2): 10 mg via INTRAVENOUS

## 2015-11-18 MED ORDER — BUPIVACAINE LIPOSOME 1.3 % IJ SUSP
INTRAMUSCULAR | Status: DC | PRN
  Administered 2015-11-18: 20 mL

## 2015-11-18 MED ORDER — HYDROMORPHONE HCL 1 MG/ML IJ SOLN: 0.2500 mg | INTRAMUSCULAR | Status: DC | PRN

## 2015-11-18 MED ORDER — STERILE WATER FOR IRRIGATION IR SOLN
Status: DC | PRN
Start: 2015-11-18 — End: 2015-11-18
  Administered 2015-11-18: 1000 mL

## 2015-11-18 MED ORDER — LATANOPROST 0.005 % OP SOLN
1.0000 [drp] | Freq: Every day | OPHTHALMIC | Status: DC
  Administered 2015-11-18 – 2015-11-19 (×2): 1 [drp] via OPHTHALMIC
  Filled 2015-11-18: qty 2.5

## 2015-11-18 MED ORDER — FENTANYL CITRATE (PF) 100 MCG/2ML IJ SOLN: 25.0000 ug | INTRAMUSCULAR | Status: DC | PRN

## 2015-11-18 MED ORDER — SUGAMMADEX SODIUM 200 MG/2ML IV SOLN
INTRAVENOUS | Status: DC | PRN
  Administered 2015-11-18: 200 mg via INTRAVENOUS

## 2015-11-18 MED ORDER — ONDANSETRON HCL 4 MG/2ML IJ SOLN
INTRAMUSCULAR | Status: DC | PRN
Start: 2015-11-18 — End: 2015-11-18
  Administered 2015-11-18: 4 mg via INTRAVENOUS

## 2015-11-18 MED ORDER — OXYCODONE HCL 5 MG PO TABS: 5.0000 mg | ORAL_TABLET | Freq: Once | ORAL | Status: DC | PRN

## 2015-11-18 MED ORDER — SUGAMMADEX SODIUM 200 MG/2ML IV SOLN
INTRAVENOUS | Status: AC
  Filled 2015-11-18: qty 2

## 2015-11-18 MED ORDER — DIPHENHYDRAMINE HCL 50 MG/ML IJ SOLN: 12.5000 mg | Freq: Four times a day (QID) | INTRAMUSCULAR | Status: DC | PRN

## 2015-11-18 MED ORDER — PROPOFOL 10 MG/ML IV BOLUS
INTRAVENOUS | Status: AC
  Filled 2015-11-18: qty 20

## 2015-11-18 MED ORDER — DEXAMETHASONE SODIUM PHOSPHATE 10 MG/ML IJ SOLN
INTRAMUSCULAR | Status: DC | PRN
  Administered 2015-11-18: 10 mg via INTRAVENOUS

## 2015-11-18 MED ORDER — OXYCODONE HCL 5 MG PO TABS
5.0000 mg | ORAL_TABLET | ORAL | Status: DC | PRN
  Administered 2015-11-20: 5 mg via ORAL
  Filled 2015-11-18: qty 1

## 2015-11-18 MED ORDER — HYDROMORPHONE HCL 1 MG/ML IJ SOLN
0.5000 mg | INTRAMUSCULAR | Status: DC | PRN
  Administered 2015-11-18: 1 mg via INTRAVENOUS
  Filled 2015-11-18: qty 1

## 2015-11-18 MED ORDER — LACTATED RINGERS IR SOLN
Status: DC | PRN
  Administered 2015-11-18: 1000 mL

## 2015-11-18 MED ORDER — SODIUM CHLORIDE 0.9 % IJ SOLN
INTRAMUSCULAR | Status: DC | PRN
Start: 2015-11-18 — End: 2015-11-18
  Administered 2015-11-18: 20 mL

## 2015-11-18 MED ORDER — HYDROCODONE-ACETAMINOPHEN 5-325 MG PO TABS: 1.0000 | ORAL_TABLET | Freq: Four times a day (QID) | ORAL | 0 refills | Status: DC | PRN

## 2015-11-18 MED ORDER — EPHEDRINE 5 MG/ML INJ
INTRAVENOUS | Status: AC
  Filled 2015-11-18: qty 10

## 2015-11-18 MED ORDER — CLINDAMYCIN PHOSPHATE 900 MG/50ML IV SOLN
INTRAVENOUS | Status: AC
  Filled 2015-11-18: qty 50

## 2015-11-18 MED ORDER — BUPIVACAINE LIPOSOME 1.3 % IJ SUSP
INTRAMUSCULAR | Status: AC
  Filled 2015-11-18: qty 20

## 2015-11-18 MED ORDER — PROPOFOL 10 MG/ML IV BOLUS
INTRAVENOUS | Status: DC | PRN
  Administered 2015-11-18: 100 mg via INTRAVENOUS

## 2015-11-18 MED ORDER — CIPROFLOXACIN IN D5W 400 MG/200ML IV SOLN
INTRAVENOUS | Status: AC
  Filled 2015-11-18: qty 200

## 2015-11-18 MED ORDER — SUCCINYLCHOLINE CHLORIDE 20 MG/ML IJ SOLN
INTRAMUSCULAR | Status: DC | PRN
  Administered 2015-11-18: 100 mg via INTRAVENOUS

## 2015-11-18 SURGICAL SUPPLY — 62 items
BAG LAPAROSCOPIC 12 15 PORT 16 (BASKET) ×1 IMPLANT
BAG RETRIEVAL 12/15 (BASKET) ×2
BAG RETRIEVAL 12/15MM (BASKET) ×1
CHLORAPREP W/TINT 26ML (MISCELLANEOUS) ×3 IMPLANT
CLIP LIGATING HEM O LOK PURPLE (MISCELLANEOUS) ×3 IMPLANT
CLIP LIGATING HEMO LOK XL GOLD (MISCELLANEOUS) ×4 IMPLANT
CLIP LIGATING HEMO O LOK GREEN (MISCELLANEOUS) ×4 IMPLANT
COVER SURGICAL LIGHT HANDLE (MISCELLANEOUS) ×3 IMPLANT
COVER TIP SHEARS 8 DVNC (MISCELLANEOUS) ×1 IMPLANT
COVER TIP SHEARS 8MM DA VINCI (MISCELLANEOUS) ×2
DECANTER SPIKE VIAL GLASS SM (MISCELLANEOUS) ×3 IMPLANT
DRAIN CHANNEL 15F RND FF 3/16 (WOUND CARE) ×3 IMPLANT
DRAPE ARM DVNC X/XI (DISPOSABLE) ×4 IMPLANT
DRAPE COLUMN DVNC XI (DISPOSABLE) ×1 IMPLANT
DRAPE DA VINCI XI ARM (DISPOSABLE) ×8
DRAPE DA VINCI XI COLUMN (DISPOSABLE) ×2
DRAPE INCISE IOBAN 66X45 STRL (DRAPES) ×3 IMPLANT
DRAPE SHEET LG 3/4 BI-LAMINATE (DRAPES) ×3 IMPLANT
DRSG TEGADERM 4X4.75 (GAUZE/BANDAGES/DRESSINGS) ×2 IMPLANT
ELECT PENCIL ROCKER SW 15FT (MISCELLANEOUS) ×3 IMPLANT
ELECT REM PT RETURN 9FT ADLT (ELECTROSURGICAL) ×3
ELECTRODE REM PT RTRN 9FT ADLT (ELECTROSURGICAL) ×1 IMPLANT
EVACUATOR SILICONE 100CC (DRAIN) ×3 IMPLANT
GLOVE BIO SURGEON STRL SZ 6.5 (GLOVE) ×2 IMPLANT
GLOVE BIO SURGEONS STRL SZ 6.5 (GLOVE) ×1
GLOVE BIOGEL M STRL SZ7.5 (GLOVE) ×6 IMPLANT
GOWN STRL REUS W/TWL LRG LVL3 (GOWN DISPOSABLE) ×9 IMPLANT
IRRIG SUCT STRYKERFLOW 2 WTIP (MISCELLANEOUS)
IRRIGATION SUCT STRKRFLW 2 WTP (MISCELLANEOUS) IMPLANT
KIT BASIN OR (CUSTOM PROCEDURE TRAY) ×3 IMPLANT
LIQUID BAND (GAUZE/BANDAGES/DRESSINGS) ×4 IMPLANT
MARKER SKIN DUAL TIP RULER LAB (MISCELLANEOUS) ×3 IMPLANT
NDL INSUFFLATION 14GA 120MM (NEEDLE) ×1 IMPLANT
NEEDLE INSUFFLATION 14GA 120MM (NEEDLE) ×3 IMPLANT
NS IRRIG 1000ML POUR BTL (IV SOLUTION) ×1 IMPLANT
PORT ACCESS TROCAR AIRSEAL 12 (TROCAR) ×1 IMPLANT
PORT ACCESS TROCAR AIRSEAL 5M (TROCAR) ×2
POSITIONER SURGICAL ARM (MISCELLANEOUS) ×6 IMPLANT
RELOAD STAPLE 60 2.6 WHT THN (STAPLE) IMPLANT
RELOAD STAPLER WHITE 60MM (STAPLE) ×5 IMPLANT
SEAL CANN UNIV 5-8 DVNC XI (MISCELLANEOUS) ×4 IMPLANT
SEAL XI 5MM-8MM UNIVERSAL (MISCELLANEOUS) ×8
SET TRI-LUMEN FLTR TB AIRSEAL (TUBING) ×3 IMPLANT
SOLUTION ELECTROLUBE (MISCELLANEOUS) ×3 IMPLANT
STAPLE ECHEON FLEX 60 POW ENDO (STAPLE) ×2 IMPLANT
STAPLER RELOAD WHITE 60MM (STAPLE) ×15
SUT ETHILON 3 0 PS 1 (SUTURE) ×3 IMPLANT
SUT MNCRL AB 4-0 PS2 18 (SUTURE) ×6 IMPLANT
SUT PDS AB 1 CT1 27 (SUTURE) ×9 IMPLANT
SUT VIC AB 2-0 SH 27 (SUTURE) ×3
SUT VIC AB 2-0 SH 27X BRD (SUTURE) ×1 IMPLANT
SUT VLOC BARB 180 ABS3/0GR12 (SUTURE) ×3
SUTURE VLOC BRB 180 ABS3/0GR12 (SUTURE) ×1 IMPLANT
TAPE STRIPS DRAPE STRL (GAUZE/BANDAGES/DRESSINGS) ×1 IMPLANT
TOWEL OR 17X26 10 PK STRL BLUE (TOWEL DISPOSABLE) ×3 IMPLANT
TOWEL OR NON WOVEN STRL DISP B (DISPOSABLE) ×3 IMPLANT
TRAY FOLEY W/METER SILVER 16FR (SET/KITS/TRAYS/PACK) ×3 IMPLANT
TRAY LAPAROSCOPIC (CUSTOM PROCEDURE TRAY) ×3 IMPLANT
TROCAR BLADELESS OPT 12M 100M (ENDOMECHANICALS) ×3 IMPLANT
TROCAR BLADELESS OPT 5 100 (ENDOMECHANICALS) ×2 IMPLANT
TROCAR XCEL 12X100 BLDLESS (ENDOMECHANICALS) ×3 IMPLANT
WATER STERILE IRR 1500ML POUR (IV SOLUTION) ×3 IMPLANT

## 2015-11-18 NOTE — Anesthesia Preprocedure Evaluation (Signed)
Anesthesia Evaluation  Patient identified by MRN, date of birth, ID band Patient awake    Reviewed: Allergy & Precautions, H&P , NPO status , Patient's Chart, lab work & pertinent test results  Airway Mallampati: II   Neck ROM: full    Dental   Pulmonary former smoker,    breath sounds clear to auscultation       Cardiovascular negative cardio ROS   Rhythm:regular Rate:Normal     Neuro/Psych dementia   GI/Hepatic   Endo/Other    Renal/GU      Musculoskeletal   Abdominal   Peds  Hematology   Anesthesia Other Findings   Reproductive/Obstetrics                             Anesthesia Physical Anesthesia Plan  ASA: II  Anesthesia Plan: General   Post-op Pain Management:    Induction: Intravenous  Airway Management Planned: Oral ETT  Additional Equipment:   Intra-op Plan:   Post-operative Plan: Extubation in OR  Informed Consent: I have reviewed the patients History and Physical, chart, labs and discussed the procedure including the risks, benefits and alternatives for the proposed anesthesia with the patient or authorized representative who has indicated his/her understanding and acceptance.     Plan Discussed with: CRNA, Anesthesiologist and Surgeon  Anesthesia Plan Comments:         Anesthesia Quick Evaluation

## 2015-11-18 NOTE — Discharge Instructions (Signed)

## 2015-11-18 NOTE — Transfer of Care (Signed)
Immediate Anesthesia Transfer of Care Note  Patient: Katie Huerta  Procedure(s) Performed: Procedure(s): XI ROBOT ASSITED LAPAROSCOPIC NEPHROURETERECTOMY  laparoscopic adhesiolysis (Right)  Patient Location: PACU  Anesthesia Type:General  Level of Consciousness:  sedated, patient cooperative and responds to stimulation  Airway & Oxygen Therapy:Patient Spontanous Breathing and Patient connected to face mask oxgen  Post-op Assessment:  Report given to PACU RN and Post -op Vital signs reviewed and stable  Post vital signs:  Reviewed and stable  Last Vitals:  Vitals:   11/18/15 1059 11/18/15 1610  BP: (!) 148/67 (P) 108/82  Pulse: (!) 51 69  Resp: 16 13  Temp: 36.8 C (P) [id number]    Complications: No apparent anesthesia complications

## 2015-11-18 NOTE — Anesthesia Procedure Notes (Signed)
Procedure Name: Intubation Date/Time: 11/18/2015 12:51 PM Performed by: Maxwell Caul Pre-anesthesia Checklist: Patient identified, Emergency Drugs available, Suction available and Patient being monitored Patient Re-evaluated:Patient Re-evaluated prior to inductionOxygen Delivery Method: Circle system utilized Preoxygenation: Pre-oxygenation with 100% oxygen Intubation Type: IV induction Ventilation: Mask ventilation without difficulty Laryngoscope Size: Mac and 4 Grade View: Grade I Tube type: Oral Tube size: 7.0 mm Number of attempts: 1 Airway Equipment and Method: Stylet and Oral airway Placement Confirmation: ETT inserted through vocal cords under direct vision,  positive ETCO2 and breath sounds checked- equal and bilateral Secured at: 21 cm Tube secured with: Tape Dental Injury: Teeth and Oropharynx as per pre-operative assessment

## 2015-11-18 NOTE — Anesthesia Postprocedure Evaluation (Signed)
Anesthesia Post Note  Patient: Katie Huerta  Procedure(s) Performed: Procedure(s) (LRB): XI ROBOT ASSITED LAPAROSCOPIC NEPHROURETERECTOMY  laparoscopic adhesiolysis (Right)  Patient location during evaluation: PACU Anesthesia Type: General Level of consciousness: awake and alert Pain management: pain level controlled Vital Signs Assessment: post-procedure vital signs reviewed and stable Respiratory status: spontaneous breathing, nonlabored ventilation, respiratory function stable and patient connected to nasal cannula oxygen Cardiovascular status: blood pressure returned to baseline and stable Postop Assessment: no signs of nausea or vomiting Anesthetic complications: no    Last Vitals:  Vitals:   11/18/15 1645 11/18/15 1705  BP: (!) 176/76 (!) 161/73  Pulse: 74 79  Resp: 19 18  Temp:  36.5 C    Last Pain:  Vitals:   11/18/15 1059  TempSrc: Oral                 [name]

## 2015-11-18 NOTE — H&P (Signed)
Katie Huerta is an 82 y.o. female.    Chief Complaint: Pre-op RIGHT robotic nephro-ureterectomy  HPI:   1 - Right High Grade Ureteral Cancer - mod-severe right hydro to mid ureter / iliac crossing to level of enhancing soft tissue density concerning for ureteral neoplasm by CT 06/2015. Remote smoker. Cr <1.5. 1 artery 2 vein right renovascular anatomy. Undewent emergent ureteral stent placment 07/01/15 for renal deompression in setting of sepsis. Dedicated ureteroscopy / biopsy / stent exchange 08/2015 confirms high-grade ureteral cancer near area of iliac crossing.   2 - Small Left Solid Enhancing Renal Mass - 1.3 cm left anterior mid 50% exophytic mass incidental by ER CT 06/2015. 1 artery / 1 vein left renovascular anatomy. No additional cortical lesions.   3 - History or Urosepsis - Pt with fevers, tachycardia, lactic acidosis and bacteruria / pyuria consistent with likely urosepsis. BCX, [place] 5/3 with Klebsiella sens bactrim, cipro.   PMH sig for dementia, appy, benign hyst. She lives independantly with her husband [name]. Her son [name] is very involved at [phone] Her PCP is Caryl Pina MD with [facility].   Today "Katie Huerta" is seen to proceed with right robotic nephrouretrectomy for her high grade obstructing ureteral cancer. No interval fevers.     Past Medical History:  Diagnosis Date  . Bradycardia   . Dementia   . History of palpitations   . History of sepsis    07-13-2015  urosepsis  . History of syncope    2013--  per documentation in epic by cardiologist-- dr hochrein -  negative work-up, felt to be vagal episode  . HOH (hard of hearing)    pt want wear wearing aids  . Hydronephrosis, right   . Left renal mass    small solid noted on ct 05/ 2017  . Macular degeneration of both eyes    treatment w/ drops and injection's  . Ureteral mass    right     Past Surgical History:  Procedure Laterality Date  . APPENDECTOMY  as teen  . CATARACT  EXTRACTION W/ INTRAOCULAR LENS  IMPLANT, BILATERAL  2005  . CYSTOSCOPY W/ URETERAL STENT PLACEMENT Right 07/01/2015   Procedure: CYSTOSCOPY WITH RETROGRADE PYELOGRAM/URETERAL STENT PLACEMENT;  Surgeon: Alexis Frock, MD;  Location: [facility];  Service: Urology;  Laterality: Right;  . CYSTOSCOPY WITH RETROGRADE PYELOGRAM, URETEROSCOPY AND STENT PLACEMENT Bilateral 09/25/2015   Procedure: CYSTOSCOPY WITH BILATERAL RETROGRADE PYELOGRAM, RIGHT URETEROSCOPY WITH BIOPSY AND RIGHT  STENT EXCHANGE;  Surgeon: Alexis Frock, MD;  Location: [facility];  Service: Urology;  Laterality: Bilateral;  . HOLMIUM LASER APPLICATION Right [id number]   Procedure: LASER ABLATION OF URETERAL MASS;  Surgeon: Alexis Frock, MD;  Location: [facility];  Service: Urology;  Laterality: Right;  . REFRACTIVE SURGERY Bilateral left 2009/  right 2011  . TRANSTHORACIC ECHOCARDIOGRAM  07-04-2015   severe LVH,  ef 60-65%,  grade 1 diastolic dysfunction/  trivial AR and MR/  mild LAE/  mild TR/  mild increase PASP 62mmHg  . VAGINAL HYSTERECTOMY  yrs ago   w/  Bilateral Salpingoophorectomy    Family History  Problem Relation Age of Onset  . Coronary artery disease Father 80    Died 65s of an MI  . Coronary artery disease Brother 88  . Coronary artery disease Sister 25   Social History:  reports that she quit smoking about 60 years ago. Her smoking use included Cigarettes. She quit after 10.00 years of use. She has  never used smokeless tobacco. She reports that she does not drink alcohol or use drugs.  Allergies:  Allergies  Allergen Reactions  . Penicillins     Unknown childhood reaction    No prescriptions prior to admission.    No results found for this or any previous visit (from the past 48 hour(s)). No results found.  Review of Systems  Constitutional: Negative.   HENT: Negative.   Eyes: Negative.   Respiratory: Negative.   Cardiovascular: Negative.   Gastrointestinal: Negative.    Genitourinary: Negative.   Musculoskeletal: Negative.   Skin: Negative.   Neurological: Negative.   Endo/Heme/Allergies: Negative.   Psychiatric/Behavioral: Negative.     There were no vitals taken for this visit. Physical Exam  Constitutional: She is oriented to person, place, and time. She appears well-developed.  HENT:  Head: Normocephalic.  Eyes: Pupils are equal, round, and reactive to light.  Neck: Normal range of motion.  Cardiovascular: Normal rate.   Respiratory: Effort normal.  GI: Soft.  Genitourinary:  Genitourinary Comments: NO CVAT at present.   Musculoskeletal: Normal range of motion.  Neurological: She is alert and oriented to person, place, and time.  Some stigmata of mild dementia, but AOx3.   Skin: Skin is warm.  Psychiatric: She has a normal mood and affect. Her behavior is normal. Judgment and thought content normal.     Assessment/Plan  Most pressing issue is clearly high grade right ureteral cancer with symptomatic obstruction. Smaller left renal mass much more likely indolent and clinically insignificant at her age.   Have discussed curative and non-curative options for her ureteral cancer with pt and family at length and they desire to proceed with neph-U. Risks, benefits, alternatives, expected peri-op course indluding need for tubes / drains discussed previously and reiterated again today. Family understnands she may require temporary SNF afterwards. They are planning on havinge 24/7 family support with her in hosptial which I adamantly requested.     Alexis Frock, MD 11/18/2015, 1:34 AM

## 2015-11-18 NOTE — Brief Op Note (Signed)
11/18/2015  3:50 PM  PATIENT:  Katie Huerta  82 y.o. female  PRE-OPERATIVE DIAGNOSIS:  RIGHT URETERAL CANCER  POST-OPERATIVE DIAGNOSIS:  right ureteral cancer  PROCEDURE:  Procedure(s): XI ROBOT ASSITED LAPAROSCOPIC NEPHROURETERECTOMY  laparoscopic adhesiolysis (Right)  SURGEON:  Surgeon(s) and Role:    * Alexis Frock, MD - Primary  PHYSICIAN ASSISTANT:   ASSISTANTS: Debbrah Alar PA   ANESTHESIA:   local and general  EBL:  Total I/O In: 1000 [I.V.:1000] Out: 200 [Urine:150; Blood:50]  BLOOD ADMINISTERED:none  DRAINS: 1 - JP to bulb, 2 - Foley to gravity   LOCAL MEDICATIONS USED:  MARCAINE     SPECIMEN:  Source of Specimen:  1 - Rt Nephroureterectomy with common iliac nodes en bloc, 2 - Additional right common iliac lymph nodes  DISPOSITION OF SPECIMEN:  PATHOLOGY  COUNTS:  YES  TOURNIQUET:  * No tourniquets in log *  DICTATION: .Other Dictation: Dictation Number [id number]  PLAN OF CARE: Admit to inpatient   PATIENT DISPOSITION:  PACU - hemodynamically stable.   Delay start of Pharmacological VTE agent (>24hrs) due to surgical blood loss or risk of bleeding: yes

## 2015-11-19 LAB — CREATININE, FLUID (PLEURAL, PERITONEAL, JP DRAINAGE): CREAT FL: 1.2 mg/dL

## 2015-11-19 LAB — BASIC METABOLIC PANEL
ANION GAP: 8 (ref 5–15)
BUN: 14 mg/dL (ref 6–20)
CHLORIDE: 104 mmol/L (ref 101–111)
CO2: 23 mmol/L (ref 22–32)
Calcium: 8.1 mg/dL — ABNORMAL LOW (ref 8.9–10.3)
Creatinine, Ser: 1.14 mg/dL — ABNORMAL HIGH (ref 0.44–1.00)
GFR calc Af Amer: 49 mL/min — ABNORMAL LOW (ref >60)
GFR, EST NON AFRICAN AMERICAN: 42 mL/min — AB (ref >60)
Glucose, Bld: 181 mg/dL — ABNORMAL HIGH (ref 65–99)
POTASSIUM: 4 mmol/L (ref 3.5–5.1)
SODIUM: 135 mmol/L (ref 135–145)

## 2015-11-19 LAB — HEMOGLOBIN AND HEMATOCRIT, BLOOD
HEMATOCRIT: 33.7 % — AB (ref 36.0–46.0)
HEMOGLOBIN: 11.1 g/dL — AB (ref 12.0–15.0)

## 2015-11-19 MED ORDER — ACETAMINOPHEN 325 MG PO TABS
650.0000 mg | ORAL_TABLET | ORAL | Status: DC | PRN
  Administered 2015-11-19 – 2015-11-20 (×2): 650 mg via ORAL
  Filled 2015-11-19 (×2): qty 2

## 2015-11-19 NOTE — Progress Notes (Signed)
Stood pt to ambulate, incontinence occurred. Pt ambulated in hall about 43ft, tolerated well. Pt stated pain a 3/10 while ambulating.

## 2015-11-19 NOTE — Progress Notes (Signed)
1 Day Post-Op  Subjective:  1 - Right High Grade Ureteral Cancer - s/p right nephroureterectomy 11/18/15 for high grade obstructing mid ureteral mass. Path pending but clinical concern for locally advanced disease. Formal bladder cuff not performed. Foley removed POD 1 and JP Cr same as serum.  Today "[name]" is progresing. Tollerating clear, pain managable. At mental status baseline.   Objective: Vital signs in last 24 hours: Temp:  [97.6 F (36.4 C)-99.6 F (37.6 C)] 99 F (37.2 C) (09/21 1331) Pulse Rate:  [66-89] 66 (09/21 1331) Resp:  [13-23] 18 (09/21 1331) BP: (108-176)/(37-82) 117/49 (09/21 1331) SpO2:  [96 %-100 %] 96 % (09/21 1331) Weight:  [58.8 kg (129 lb 10.1 oz)] 58.8 kg (129 lb 10.1 oz) (09/21 0519) Last BM Date: 11/18/15  Intake/Output from previous day: 09/20 0701 - 09/21 0700 In: 3232.5 [I.V.:3232.5] Out: 65 [Urine:325; Drains:85; Blood:50] Intake/Output this shift: Total I/O In: 240 [P.O.:240] Out: 645 [Urine:625; Drains:20]  General appearance: alert, cooperative, appears stated age and family at bedside Eyes: negative Nose: Nares normal. Septum midline. Mucosa normal. No drainage or sinus tenderness. Throat: lips, mucosa, and tongue normal; teeth and gums normal Neck: supple, symmetrical, trachea midline Back: symmetric, no curvature. ROM normal. No CVA tenderness. Resp: non-labored on room air Cardio: Nl rate GI: soft, non-tender; bowel sounds normal; no masses,  no organomegaly and recent surgical sites c/d/i. No hernias. JP with scant serosanguinous fluid Pelvic: external genitalia normal and foley now out Extremities: extremities normal, atraumatic, no cyanosis or edema Lymph nodes: Cervical, supraclavicular, and axillary nodes normal. Neurologic: Grossly normal Incision/Wound: as per above.   Lab Results:   Recent Labs  11/18/15 1640 11/19/15 0524  HGB 12.1 11.1*  HCT 37.7 33.7*   BMET  Recent Labs  11/19/15 0524  NA 135  K 4.0  CL  104  CO2 23  GLUCOSE 181*  BUN 14  CREATININE 1.14*  CALCIUM 8.1*   PT/INR No results for input(s): LABPROT, INR in the last 72 hours. ABG No results for input(s): PHART, HCO3 in the last 72 hours.  Invalid input(s): PCO2, PO2  Studies/Results: No results found.  Anti-infectives: Anti-infectives    Start     Dose/Rate Route Frequency Ordered Stop   11/18/15 1215  clindamycin (CLEOCIN) IVPB 900 mg     900 mg 100 mL/hr over 30 Minutes Intravenous  Once 11/18/15 1209 11/18/15 1348   11/18/15 1209  ciprofloxacin (CIPRO) IVPB 400 mg  Status:  Discontinued     400 mg 200 mL/hr over 60 Minutes Intravenous Every 12 hours 11/18/15 1209 11/18/15 1708      Assessment/Plan:  1 - Right High Grade Ureteral Cancer - doing very well POD 1. Adv diet, ambulate. Will DC tomorrow and possible DC as soon as tomorrow given current progress.   [facility], Katie Huerta 11/19/2015

## 2015-11-19 NOTE — Op Note (Signed)
NAME:  Katie Huerta, Katie Huerta NO.:  [id number]  MEDICAL RECORD NO.:  [id number]  LOCATION:  [id number]                         FACILITY:  [facility]  PHYSICIAN:  Alexis Frock, MD     DATE OF BIRTH:  10-31-1927  DATE OF PROCEDURE: 11/18/2015                               OPERATIVE REPORT   DIAGNOSIS:  High-grade right ureteral cancer.  PROCEDURES: 1. Robotic-assisted laparoscopic right nephroureterectomy with pelvic     lymphadenectomy. 2. Laparoscopic extensive adhesiolysis.  ESTIMATED BLOOD LOSS:  50 mL.  COMPLICATION:  None.  SPECIMENS: 1. Right nephroureterectomy with common iliac nodes en bloc. 2. Additional right common iliac lymph nodes.  ASSISTANT:  Debbrah Alar, PA.  DRAINS: 1. Jackson-Pratt drain to bulb suction. 2. Foley catheter to straight drain.  FINDINGS: 1. Likely locally-advanced right ureteral cancer with appeared to be     extra-ureteral disease as well as some pelvic adenopathy in the     right iliac group. 2. One artery, two-vein right renovascular anatomy as anticipated. 3. Complete removal of right in-situ ureteral stent.  INDICATION:  Ms. Paskins is a pleasant 82 year old lady with history of mild-to-moderate dementia but with otherwise excellent baseline functional status.  She was found on workup of urosepsis and right hydronephrosis to have an enhancing right midureteral mass, this was clinically localized.  She cleared her infectious parameters after temporizing ureteral stenting, underwent diagnostic ureteroscopy and biopsy, which did reveal high-grade ureteral cancer in the midureter close to the iliac crossing, this again clinically localized.  Options were discussed with the patient and family in detail including a purely palliative approach versus just chronic stenting with p.r.n. ureteroscopic ablation versus right nephroureterectomy with curative and palliative intent. The patient and family adamantly wished to proceed with the  latter and expresses over several office visits.  Informed consent was obtained and placed in the medical record.  PROCEDURE IN DETAIL:  The patient being [name], was verified.  Procedure being right nephrectomy was confirmed.  Procedure was carried out.  Time-out was performed.  Intravenous antibiotics were administered.  General endotracheal anesthesia was introduced.  The patient was placed into a right side up full flank position, applying 15 degrees of stable flexion, superior arm elevator, axillary roll, sequential compression devices, bottom leg bent, top leg straight.  She has further fashioned to the operative table using 3-inch tape over foam padding across her supraxiphoid chest and her pelvis.  Foley catheter had already been placed per urethra to straight drain.  Axillary roll had already been placed as well.  Superior arm elevator was used, all bony prominences were padded.  Sterile field was created by prepping and draping the patient's entire right flank and abdomen using chlorhexidine gluconate.  Next, a high-flow, low-pressure pneumoperitoneum was obtained using Veress technique in the right lower quadrant having passed the aspiration and drop test.  Next, robotic camera port was placed and positioned approximately 1 handbreadth superolateral to the umbilicus.  Laparoscopic examination of the peritoneal cavity revealed multifocal adhesions mostly of the cecum, ascending colon as well as some omentum in this area.  There was also some likely partial hernia in the right lower quadrant.  This did correspond to an area externally a Pfannenstiel  incision.  Additional ports were then carefully placed as follows:  Right subcostal 8-mm robotic port lateral to the level of the camera port, a near midline inferior 8-mm robotic port approximately 1 handbreadth inferior to the umbilicus, a [id number] AirSeal assistant port in the midline approximately 3 fingerbreadths below  the camera port and a 12-mm assistant port in the midline approximately 1 fingerbreadth above the camera port as well as a 5-mm subcostal port, which was placed for liver retraction.  Through which, a self-locking grasper was used to elevate the lower edge of the liver off the anterior-superior surface of the kidney.  Robot was docked and passed through the electronic checks. Initial attention was directed at adhesiolysis, very careful adhesiolysis was performed approximately 35 minutes of adhesions between the cecum, its mesentery and its epiploic fat in the right lower quadrant anterior abdominal wall.  There was some omentum in this area too that was also partially herniated through a small hernia defect, this did correspond to a site externally of Pfannenstiel incision.  The patient does have history of prior hysterectomy.  Exquisite care was taken to avoid bowel injury, which did not occur.  The ascending colon was further mobilized thus developing the retroperitoneal space on the right side.  Lower pole of the kidney area was identified, placed on gentle lateral traction.  The duodenum was encountered and kocherized medially.  The psoas muscle was encountered as was the ureter at the level of the lower pole of the kidney and dissection proceeded within this triangle inferiorly towards the area of the ureterovesical junction.  Beginning at approximately 3 cm above the iliac crossing, there was very dense desmoplastic reaction as well as pelvic lymphadenopathy highly concerning for locally-advanced disease as well as lymph node positive disease.  As the patient and family's goals were also symptom palliation, it was felt that proceeding with surgery with maximal resection of this to avoid bleeding, recurrent urosepsis and need for recurrent procedure was clearly warranted.  As such, very very careful dissection was performed circumferentially around the common iliac vessels and  ureteral crossing dissecting these away from the ureter, mass and common iliac lymph nodes en bloc toward the area of the ureterovesical junction.  We were able to dissect the ureter distally past the area of the superior vesical artery.  Given the locally- advanced disease, it was not felt that formal bladder cuff would be warranted or prudent.  As such, a large clip was placed on the ureter proximally, it was transected halfway the distance distally.  The in- situ stent distally was carefully brought forth and the distal ureter was coldly clipped using extra-large clip.  This completed the distal ureteral dissection, which was within 2 cm of the ureterovesical junction distally.  Dissection was then proceeded superiorly within the triangle of the ureter and psoas musculature and kidney towards the area of the renal hilum.  The renal hilum was moderately complex and that consisted of a 2-vein single early-branching artery, renovascular anatomy as anticipated.  There were several parasitic vessels in this location as well.  The superior most vein, which was smaller in caliber was controlled using vascular load stapler, which then allowed a much better window toward the area of the renal artery trunk, which was then controlled using extra-large Hem-O-Lok proximally and vascular load stapler distally involving the inferior dominant vein using vascular load stapler.  Partial adrenal sparing was performed using vascular load stapler at the area of the mid-adrenal gland.  Lateral attachments  were taken down using cautery scissors and this completely freed up the right nephrectomy specimen with common iliac nodes en bloc.  This was placed into an EndoCatch bag for later retrieval.  Notably, assistant, Debbrah Alar was crucial in all aspects of hilar control clipping and stapling as well as distal ureteral dissection with suctioning and retraction as the bedside assistant.  Hemostasis appeared  excellent.  Pelvis was again inspected as was the deep pelvis with no evidence of visceral injury. Robot was then undocked after close suction drain was brought to the previous lateral most robotic port site in the area of the peritoneal cavity.  Specimen was retrieved by extending and connecting the prior 12- mm midline assistant port sites and removing the nephroureterectomy specimen and setting it aside for permanent pathology.  The patient had very abbreviated-appearing fascia and as such, an 8-mm robotic ports were closed at the level of the fascia using single Vicryl suture and the extraction site was closed using figure-of-eight PDS times approximately 6.  Scarpa was reapproximated with running Vicryl.  All incision sites were infiltrated with dilute lyophilized Marcaine and closed at the level of the skin using subcuticular Monocryl followed by Dermabond and the procedure was terminated.  The patient tolerated the procedure well.  There were no immediate periprocedural complications. The patient was taken to the postanesthesia care unit in stable condition.          ______________________________ Alexis Frock, MD     TM/MEDQ  D:  11/18/2015  T:  11/19/2015  Job:  [id number]

## 2015-11-19 NOTE — Progress Notes (Signed)
Foley cath removed around 0905. Pt given bed bath and transferred from bed to chair with minimal assistance. Pt rates pain 5 when moving. Full linen change.  Assisted by Rosemarie Ax, SN

## 2015-11-20 DIAGNOSIS — Z23 Encounter for immunization: Secondary | ICD-10-CM | POA: Diagnosis not present

## 2015-11-20 MED ORDER — TRAMADOL HCL 50 MG PO TABS: 50.0000 mg | ORAL_TABLET | Freq: Four times a day (QID) | ORAL | 0 refills | Status: DC | PRN

## 2015-11-20 MED ORDER — INFLUENZA VAC SPLIT QUAD 0.5 ML IM SUSY
0.5000 mL | PREFILLED_SYRINGE | Freq: Once | INTRAMUSCULAR | Status: AC
  Administered 2015-11-20: 0.5 mL via INTRAMUSCULAR
  Filled 2015-11-20: qty 0.5

## 2015-11-20 MED ORDER — SENNOSIDES-DOCUSATE SODIUM 8.6-50 MG PO TABS: 1.0000 | ORAL_TABLET | Freq: Two times a day (BID) | ORAL | 0 refills | Status: DC

## 2015-11-20 NOTE — Discharge Summary (Signed)
Physician Discharge Summary  Patient ID: Katie Huerta MRN: [id number] DOB/AGE: 80-Aug-1929 80 y.o.  Admit date: 11/18/2015 Discharge date: 11/20/2015  Admission Diagnoses: Right Ureteral Mass  Discharge Diagnoses:  Active Problems:   Ureteral mass   Discharged Condition: good  Hospital Course: 1 - Right High Grade Ureteral Cancer -s/p right nephroureterectomy 11/18/15 for high grade obstructing mid ureteral mass. Path pending but clinical concern for locally advanced disease. Formal bladder cuff not performed. Foley removed POD 1 and JP Cr same as serum. By POD 2, the day of discharge, she is ambulatory, pain controlled on PO meds, tollerating PO intake, and felt to be adequate for discharge.   Consults: None  Significant Diagnostic Studies: labs: as per above  Treatments: right nephroureterectomy 11/18/15   Discharge Exam: Blood pressure (!) 129/47, pulse 80, temperature 99.1 F (37.3 C), temperature source Oral, resp. rate 18, height 5' 3.5" (1.613 m), weight 58.8 kg (129 lb 10.1 oz), SpO2 96 %. General appearance: alert, cooperative, appears stated age and husband and son at bedside. Pt at mental and functional baselien.  Eyes: negative Nose: Nares normal. Septum midline. Mucosa normal. No drainage or sinus tenderness. Throat: lips, mucosa, and tongue normal; teeth and gums normal Neck: supple, symmetrical, trachea midline Back: symmetric, no curvature. ROM normal. No CVA tenderness. Resp: non-labored on room air.  Cardio: Nl rate GI: soft, non-tender; bowel sounds normal; no masses,  no organomegaly and Recent port and extraction sites c/d/i. No hernias.  Pelvic: external genitalia normal Extremities: extremities normal, atraumatic, no cyanosis or edema Pulses: 2+ and symmetric Lymph nodes: Cervical, supraclavicular, and axillary nodes normal. Neurologic: Mental status: Alert, oriented, thought content appropriate, stigmata of moderate dementia. Very pleasant.    Disposition: 01-Home or Self Care     Medication List    STOP taking these medications   aspirin 81 MG tablet   B-12 2000 MCG Tabs   EYE VITAMINS Caps     TAKE these medications   latanoprost 0.005 % ophthalmic solution Commonly known as:  XALATAN Place 1 drop into both eyes at bedtime.   senna-docusate 8.6-50 MG tablet Commonly known as:  Senokot-S Take 1 tablet by mouth 2 (two) times daily. While taking pain meds to prevent constipation.   traMADol 50 MG tablet Commonly known as:  ULTRAM Take 1 tablet (50 mg total) by mouth every 6 (six) hours as needed for moderate pain. Post-operatively      Follow-up Information    Alexis Frock, MD Follow up on 11/26/2015.   Specialty:  Urology Why:  at [facility] for MD visit. Dr. Tresa Moore will call you with pathology results when available.  Contact information: [place] [address] [phone]           Signed: Alexis Frock 11/20/2015, 5:50 PM

## 2015-11-26 DIAGNOSIS — D4121 Neoplasm of uncertain behavior of right ureter: Secondary | ICD-10-CM | POA: Diagnosis not present

## 2015-11-26 DIAGNOSIS — D4102 Neoplasm of uncertain behavior of left kidney: Secondary | ICD-10-CM | POA: Diagnosis not present

## 2015-11-26 DIAGNOSIS — N3020 Other chronic cystitis without hematuria: Secondary | ICD-10-CM | POA: Diagnosis not present

## 2015-12-04 ENCOUNTER — Ambulatory Visit: Payer: Medicare Other | Admitting: Pediatrics

## 2015-12-07 ENCOUNTER — Ambulatory Visit (INDEPENDENT_AMBULATORY_CARE_PROVIDER_SITE_OTHER): Payer: Medicare Other | Admitting: Pediatrics

## 2015-12-07 ENCOUNTER — Encounter: Payer: Self-pay | Admitting: Pediatrics

## 2015-12-07 VITALS — BP 120/66 | HR 63 | Temp 97.3°F | Ht 63.5 in | Wt 125.0 lb

## 2015-12-07 DIAGNOSIS — N289 Disorder of kidney and ureter, unspecified: Secondary | ICD-10-CM | POA: Diagnosis not present

## 2015-12-07 DIAGNOSIS — Z9889 Other specified postprocedural states: Secondary | ICD-10-CM | POA: Diagnosis not present

## 2015-12-07 DIAGNOSIS — E538 Deficiency of other specified B group vitamins: Secondary | ICD-10-CM

## 2015-12-07 DIAGNOSIS — F0390 Unspecified dementia without behavioral disturbance: Secondary | ICD-10-CM | POA: Diagnosis not present

## 2015-12-07 DIAGNOSIS — N2889 Other specified disorders of kidney and ureter: Secondary | ICD-10-CM

## 2015-12-07 NOTE — Progress Notes (Signed)
  Subjective:   Patient ID: Katie Huerta, female    DOB: Aug 31, 1927, 82 y.o.   MRN: [id number] CC: Hospitalization Follow-up and med problem follow up HPI: Katie Huerta is a 82 y.o. female presenting for Hospitalization Follow-up  Recent surgery for right nephroureterectomy 11/18/15 for high grade obstructing mid ureteral mass Discharged 11/20/2915 Pathology returned with infiltrative high grade urothelial carcinoma. All margins negative.  Taking tylenol at first for pain, now not needing anything for pain Taking her showers, has been doing great per husband after surgery At first after standing up some would have abd pain, now improved Incision site gives her no trouble  No falls Normal urination now No frequency No dysuria No blood in urine since discharge from hosp Has had some incontinence for years, now not as frequent  Husband says she is to get checked every 6 mo by urologist for blood work and bladder scan Recent urology visit and told kidney function was good  Appetite has been good No constipation, goes every morning Family and friends have been supportive Husband feels like he is able to care for her as she needs She is independent in ADLs now thought needs to be reminded to do things  Reviewed medications  Relevant past medical, surgical, family and social history reviewed. Allergies reviewed and updated. History  Smoking Status  . Former Smoker  . Years: 10.00  . Types: Cigarettes  . Quit date: 10/27/1955  Smokeless Tobacco  . Never Used   ROS: Per HPI   Objective:    BP 120/66   Pulse 63   Temp 97.3 F (36.3 C) (Oral)   Ht 5' 3.5" (1.613 m)   Wt 125 lb (56.7 kg)   BMI 21.80 kg/m   Wt Readings from Last 3 Encounters:  12/07/15 125 lb (56.7 kg)  11/19/15 129 lb 10.1 oz (58.8 kg)  11/10/15 128 lb 6.4 oz (58.2 kg)    Gen: NAD, alert, cooperative with exam, NCAT EYES: EOMI, no conjunctival injection, or no icterus CV: NRRR, normal  S1/S2, no murmur, distal pulses 2+ b/l Resp: CTABL, no wheezes, normal WOB Abd: +BS, soft, NTND.  Ext: No edema, warm Neuro: Alert and oriented, strength equal b/l UE and LE, coordination grossly normal Skin: midline incision well healing, minimal pink along edges of laceration, few areas still with crust, no redness, no discharge, no tenderness  Assessment & Plan:  Katie Huerta was seen today for hospitalization follow-up and follow up medical problems. Discharged 11/20/2015 Has been doing well at home per pt and husband No complaints today  Diagnoses and all orders for this visit:  Dementia without behavioral disturbance, unspecified dementia type Baseline No worsening of symptoms Husband contineus to need to remind her to do many of ADLs, he says he is doing fine with extra support from family friends recently with surgery  Vitamin B 12 deficiency Cont PO B12  Replacement  Ureteral mass s/p removal  Removed 9/20  Recent major surgery Eating well, no constipation Following with urology q79mo  Follow up plan: Return in about 3 months (around 03/08/2016) for med follow up. Assunta Found, MD [facility]

## 2015-12-29 DIAGNOSIS — H353223 Exudative age-related macular degeneration, left eye, with inactive scar: Secondary | ICD-10-CM | POA: Diagnosis not present

## 2015-12-29 DIAGNOSIS — H353211 Exudative age-related macular degeneration, right eye, with active choroidal neovascularization: Secondary | ICD-10-CM | POA: Diagnosis not present

## 2016-03-22 DIAGNOSIS — H35423 Microcystoid degeneration of retina, bilateral: Secondary | ICD-10-CM | POA: Diagnosis not present

## 2016-03-22 DIAGNOSIS — H353222 Exudative age-related macular degeneration, left eye, with inactive choroidal neovascularization: Secondary | ICD-10-CM | POA: Diagnosis not present

## 2016-03-22 DIAGNOSIS — H43813 Vitreous degeneration, bilateral: Secondary | ICD-10-CM | POA: Diagnosis not present

## 2016-03-22 DIAGNOSIS — H353211 Exudative age-related macular degeneration, right eye, with active choroidal neovascularization: Secondary | ICD-10-CM | POA: Diagnosis not present

## 2016-04-06 ENCOUNTER — Ambulatory Visit (INDEPENDENT_AMBULATORY_CARE_PROVIDER_SITE_OTHER): Payer: Medicare Other | Admitting: Family Medicine

## 2016-04-06 ENCOUNTER — Encounter: Payer: Self-pay | Admitting: Family Medicine

## 2016-04-06 VITALS — BP 148/77 | HR 64 | Temp 97.2°F | Ht 63.5 in | Wt 132.0 lb

## 2016-04-06 DIAGNOSIS — R55 Syncope and collapse: Secondary | ICD-10-CM | POA: Diagnosis not present

## 2016-04-06 DIAGNOSIS — H6123 Impacted cerumen, bilateral: Secondary | ICD-10-CM

## 2016-04-06 NOTE — Progress Notes (Signed)
BP (!) 148/77   Pulse 64   Temp 97.2 F (36.2 C) (Oral)   Ht 5' 3.5" (1.613 m)   Wt 132 lb (59.9 kg)   BMI 23.02 kg/m    Subjective:    Patient ID: Katie Huerta, female    DOB: 11/05/1927, 83 y.o.   MRN: [id number]  HPI: Katie Huerta is a 83 y.o. female presenting on 04/06/2016 for Dementia   HPI Fall and near syncope Patient comes in today for a fall and near syncope that occurred 3 days ago in the shower. He says that she was taking a shower and he was in the other room and they heard some noises and went in there and found her had slid down onto the floor. He does not think that she fell and injured anything and she is not complaining any pain in any of her joints back or hip or anywhere else in her body today. He just thinks that she may have had an episode of dementia or something else that caused her to feel lightheaded and fall down. He says when he found her and he was in there pretty immediately she was conscious but just "out of it". He said that she was out of it for about 10-15 minutes in the process of which he got her up and dried off and dressed and into the other room sitting down. Since this happened 3 days ago she has not had any similar episodes and has been acting back to her usual baseline. She cannot remember the incident because she has almost no short-term memory recollection because of her dementia. During the time period when she was out of it he did use some alcohol in her nose and a rag on her for her to try to help bring her back to her normal self. He denies any numbness or weakness or facial drooping on one side or the other that he noticed. He says that she has not been complaining of any urinary symptoms or abdominal pain or diarrhea or constipation or cough or congestion.  Relevant past medical, surgical, family and social history reviewed and updated as indicated. Interim medical history since our last visit reviewed. Allergies and medications  reviewed and updated.  Review of Systems  Constitutional: Negative for chills and fever.  HENT: Negative for congestion, ear discharge and ear pain.   Eyes: Negative for redness and visual disturbance.  Respiratory: Negative for chest tightness and shortness of breath.   Cardiovascular: Negative for chest pain and leg swelling.  Gastrointestinal: Negative for abdominal pain, constipation, diarrhea, nausea and vomiting.  Genitourinary: Negative for difficulty urinating, dysuria and frequency.  Musculoskeletal: Negative for back pain and gait problem.  Skin: Negative for rash.  Neurological: Positive for dizziness and light-headedness. Negative for syncope, weakness, numbness and headaches.  Psychiatric/Behavioral: Negative for agitation and behavioral problems.  All other systems reviewed and are negative.   Per HPI unless specifically indicated above     Objective:    BP (!) 148/77   Pulse 64   Temp 97.2 F (36.2 C) (Oral)   Ht 5' 3.5" (1.613 m)   Wt 132 lb (59.9 kg)   BMI 23.02 kg/m   Wt Readings from Last 3 Encounters:  04/06/16 132 lb (59.9 kg)  12/07/15 125 lb (56.7 kg)  11/19/15 129 lb 10.1 oz (58.8 kg)    Physical Exam  Constitutional: She is oriented to person, place, and time. She appears well-developed and well-nourished. No  distress.  HENT:  Cerumen impaction bilaterally  Eyes: Conjunctivae are normal.  Neck: Neck supple. No thyromegaly present.  Cardiovascular: Normal rate, regular rhythm, normal heart sounds and intact distal pulses.   No murmur heard. Pulmonary/Chest: Effort normal and breath sounds normal. No respiratory distress. She has no wheezes. She has no rales.  Abdominal: Soft. Bowel sounds are normal. She exhibits no distension. There is no tenderness. There is no rebound and no guarding.  Musculoskeletal: Normal range of motion. She exhibits no edema or tenderness.  Lymphadenopathy:    She has no cervical adenopathy.  Neurological: She is alert  and oriented to person, place, and time. Coordination normal.  Skin: Skin is warm and dry. No rash noted. She is not diaphoretic.  Psychiatric: She has a normal mood and affect. Her behavior is normal.  Nursing note and vitals reviewed.  Nurse to lavage, patient tolerated well. Able to remove most of the cerumen without difficulty.    Assessment & Plan:   Problem List Items Addressed This Visit    None    Visit Diagnoses    Bilateral impacted cerumen    -  Primary   Vasovagal near syncope       Likely a vasovagal, happened in shower, has not happened since, continue to monitor and use shower chair to help prevent this.      Recommend using mineral oil drops or sweet oil drops in each ear twice a day to help prevent wax buildup.  Follow up plan: Return if symptoms worsen or fail to improve.  Counseling provided for all of the vaccine components No orders of the defined types were placed in this encounter.   Caryl Pina, MD [facility] 04/06/2016, 8:56 AM

## 2016-05-02 ENCOUNTER — Other Ambulatory Visit: Payer: Self-pay | Admitting: Urology

## 2016-05-02 ENCOUNTER — Ambulatory Visit (HOSPITAL_COMMUNITY)
Admission: RE | Admit: 2016-05-02 | Discharge: 2016-05-02 | Disposition: A | Discharge status: Home / Self Care | Payer: Medicare Other | Source: Ambulatory Visit | Attending: Urology | Admitting: Urology

## 2016-05-02 DIAGNOSIS — D4121 Neoplasm of uncertain behavior of right ureter: Secondary | ICD-10-CM | POA: Diagnosis not present

## 2016-05-02 DIAGNOSIS — N2889 Other specified disorders of kidney and ureter: Secondary | ICD-10-CM | POA: Diagnosis not present

## 2016-05-02 DIAGNOSIS — C661 Malignant neoplasm of right ureter: Secondary | ICD-10-CM | POA: Diagnosis not present

## 2016-05-02 DIAGNOSIS — D4102 Neoplasm of uncertain behavior of left kidney: Secondary | ICD-10-CM | POA: Diagnosis not present

## 2016-05-05 DIAGNOSIS — D4121 Neoplasm of uncertain behavior of right ureter: Secondary | ICD-10-CM | POA: Diagnosis not present

## 2016-05-05 DIAGNOSIS — D4102 Neoplasm of uncertain behavior of left kidney: Secondary | ICD-10-CM | POA: Diagnosis not present

## 2016-05-05 DIAGNOSIS — N3020 Other chronic cystitis without hematuria: Secondary | ICD-10-CM | POA: Diagnosis not present

## 2016-06-15 DIAGNOSIS — H353231 Exudative age-related macular degeneration, bilateral, with active choroidal neovascularization: Secondary | ICD-10-CM | POA: Diagnosis not present

## 2016-06-15 DIAGNOSIS — H43813 Vitreous degeneration, bilateral: Secondary | ICD-10-CM | POA: Diagnosis not present

## 2016-06-15 DIAGNOSIS — H35423 Microcystoid degeneration of retina, bilateral: Secondary | ICD-10-CM | POA: Diagnosis not present

## 2016-06-15 DIAGNOSIS — H353211 Exudative age-related macular degeneration, right eye, with active choroidal neovascularization: Secondary | ICD-10-CM | POA: Diagnosis not present

## 2016-07-20 DIAGNOSIS — H353231 Exudative age-related macular degeneration, bilateral, with active choroidal neovascularization: Secondary | ICD-10-CM | POA: Diagnosis not present

## 2016-09-07 DIAGNOSIS — H43813 Vitreous degeneration, bilateral: Secondary | ICD-10-CM | POA: Diagnosis not present

## 2016-09-07 DIAGNOSIS — H353232 Exudative age-related macular degeneration, bilateral, with inactive choroidal neovascularization: Secondary | ICD-10-CM | POA: Diagnosis not present

## 2016-09-07 DIAGNOSIS — H35423 Microcystoid degeneration of retina, bilateral: Secondary | ICD-10-CM | POA: Diagnosis not present

## 2016-09-29 ENCOUNTER — Ambulatory Visit (INDEPENDENT_AMBULATORY_CARE_PROVIDER_SITE_OTHER): Payer: Medicare Other | Admitting: *Deleted

## 2016-09-29 VITALS — BP 145/73 | HR 67 | Ht 62.0 in | Wt 134.0 lb

## 2016-09-29 DIAGNOSIS — Z0000 Encounter for general adult medical examination without abnormal findings: Secondary | ICD-10-CM

## 2016-09-29 NOTE — Patient Instructions (Signed)
  Katie Huerta , Thank you for taking time to come for your Medicare Wellness Visit. I appreciate your ongoing commitment to your health goals. Please review the following plan we discussed and let me know if I can assist you in the future.   These are the goals we discussed: Goals    . Exercise 3x per week (30 min per time)          Try to walk for 30 minutes at least 3 times per week.    . Have 3 meals a day          Try to eat more lean proteins, fruits, and vegetables and eat 3 meals per day.       This is a list of the screening recommended for you and due dates:  Health Maintenance  Topic Date Due  . Tetanus Vaccine  03/16/1948  . DEXA scan (bone density measurement)  03/16/1994  . Pneumonia vaccines (1 of 2 - PCV13) 03/16/1994  . Flu Shot  09/28/2016

## 2016-09-30 NOTE — Progress Notes (Signed)
Subjective:   Katie Huerta is a 83 y.o. female who presents for an Initial Medicare Annual Wellness Visit. Ms Clum is accompanied by her husband who provides a lot of her information due to dementia. Ms Padgett is retired from working in a [employer]. She lives at home with her husband. They have 2 adult sons, 3 grandchildren, and 2 great grandchildren. She enjoys watching TV and doing crossword and word search puzzles.   Review of Systems    She reports that her health is about the same as last year.   Cognitive: Decrease in memory but patient isn't really aware of it  Cardiac Risk Factors include: sedentary lifestyle;advanced age (>44men, >45 women)   Other systems negative     Objective:    Today's Vitals   09/29/16 1414  BP: (!) 145/73  Pulse: 67  Weight: 134 lb (60.8 kg)  Height: 5\' 2"  (1.575 m)   Body mass index is 24.51 kg/m.   Current Medications (verified) Outpatient Encounter Prescriptions as of 09/29/2016  Medication Sig  . aspirin 81 MG chewable tablet Chew 81 mg by mouth daily.  Marland Kitchen latanoprost (XALATAN) 0.005 % ophthalmic solution Place 1 drop into both eyes at bedtime.   Marland Kitchen tobramycin (TOBREX) 0.3 % ophthalmic solution   . vitamin B-12 (CYANOCOBALAMIN) 500 MCG tablet Take 500 mcg by mouth daily.   No facility-administered encounter medications on file as of 09/29/2016.     Allergies (verified) Penicillins   History: Past Medical History:  Diagnosis Date  . Bradycardia   . Dementia   . History of palpitations   . History of sepsis    07-13-2015  urosepsis  . History of syncope    2013--  per documentation in epic by cardiologist-- dr hochrein -  negative work-up, felt to be vagal episode  . HOH (hard of hearing)    pt want wear wearing aids  . Hydronephrosis, right   . Left renal mass    small solid noted on ct 05/ 2017  . Macular degeneration of both eyes    treatment w/ drops and injection's  . Ureteral mass    right    Past  Surgical History:  Procedure Laterality Date  . APPENDECTOMY  as teen  . CATARACT EXTRACTION W/ INTRAOCULAR LENS  IMPLANT, BILATERAL  2005  . CYSTOSCOPY W/ URETERAL STENT PLACEMENT Right 07/01/2015   Procedure: CYSTOSCOPY WITH RETROGRADE PYELOGRAM/URETERAL STENT PLACEMENT;  Surgeon: Alexis Frock, MD;  Location: [facility];  Service: Urology;  Laterality: Right;  . CYSTOSCOPY WITH RETROGRADE PYELOGRAM, URETEROSCOPY AND STENT PLACEMENT Bilateral 09/25/2015   Procedure: CYSTOSCOPY WITH BILATERAL RETROGRADE PYELOGRAM, RIGHT URETEROSCOPY WITH BIOPSY AND RIGHT  STENT EXCHANGE;  Surgeon: Alexis Frock, MD;  Location: [facility];  Service: Urology;  Laterality: Bilateral;  . HOLMIUM LASER APPLICATION Right 8/67/6195   Procedure: LASER ABLATION OF URETERAL MASS;  Surgeon: Alexis Frock, MD;  Location: [facility];  Service: Urology;  Laterality: Right;  . REFRACTIVE SURGERY Bilateral left 2009/  right 2011  . ROBOT ASSITED LAPAROSCOPIC NEPHROURETERECTOMY Right 11/18/2015   Procedure: XI ROBOT ASSITED LAPAROSCOPIC NEPHROURETERECTOMY  laparoscopic adhesiolysis;  Surgeon: Alexis Frock, MD;  Location: [facility];  Service: Urology;  Laterality: Right;  . TRANSTHORACIC ECHOCARDIOGRAM  07-04-2015   severe LVH,  ef 60-65%,  grade 1 diastolic dysfunction/  trivial AR and MR/  mild LAE/  mild TR/  mild increase PASP 97mmHg  . VAGINAL HYSTERECTOMY  yrs ago   w/  Bilateral  Salpingoophorectomy   Family History  Problem Relation Age of Onset  . Coronary artery disease Father 55       Died 67s of an MI  . Heart attack Father   . Coronary artery disease Brother 75  . Heart attack Brother   . Coronary artery disease Sister 72  . Heart attack Brother   . Heart attack Brother    Social History   Occupational History  . Two part time jobs.     Social History Main Topics  . Smoking status: Former Smoker    Years: 10.00    Types: Cigarettes    Quit date: 10/27/1955  . Smokeless  tobacco: Never Used  . Alcohol use No  . Drug use: No  . Sexual activity: Yes    Tobacco Counseling No tobacco use  Activities of Daily Living In your present state of health, do you have any difficulty performing the following activities: 09/29/2016 11/18/2015  Hearing? Y Y  Comment - refuses to wear hearing aid  Vision? N N  Difficulty concentrating or making decisions? N N  Comment Patient has some problems with her memory but she doesn't realize it -  Walking or climbing stairs? N N  Dressing or bathing? N N  Doing errands, shopping? Y N  Preparing Food and eating ? N -  Using the Toilet? N -  In the past six months, have you accidently leaked urine? Y -  Comment Enuresis and stress incontinence. Wears pullups -  Do you have problems with loss of bowel control? N -  Managing your Medications? Y -  Comment husband helps with eye drops -  Managing your Finances? Y -  Housekeeping or managing your Housekeeping? N -  Some recent data might be hidden    Immunizations and Health Maintenance Immunization History  Administered Date(s) Administered  . Influenza,inj,Quad PF,36+ Mos 12/11/2014, 11/20/2015   Health Maintenance Due  Topic Date Due  . TETANUS/TDAP  03/16/1948  . DEXA SCAN  03/16/1994  . PNA vac Low Risk Adult (1 of 2 - PCV13) 03/16/1994  . INFLUENZA VACCINE  09/28/2016    Patient Care Team: [name], MD as PCP - General (Pediatrics) Sherlynn Stalls, MD as Consulting Physician (Ophthalmology) Alexis Frock, MD as Consulting Physician (Urology)   Patient was hospitalized in September 2017 for a kidney removal due to a renal mass. No ER visits.    Assessment:   This is a routine wellness examination for [place].   Hearing/Vision screen Patient does have some difficulty with hearing. No vision deficits noted. Eye exam is up to date with Dr Baird Cancer.   Dietary issues and exercise activities discussed: Current Exercise Habits: The patient does not  participate in regular exercise at present, Exercise limited by: None identified  Diet: Eats 3 meals a day but according to her husband they aren't very substantial. An example of breakfast is 2 powdered donuts and Pringles potato chips.  Goals    . Exercise 3x per week (30 min per time)          Try to walk for 30 minutes at least 3 times per week.    . Have 3 meals a day          Try to eat more lean proteins, fruits, and vegetables and eat 3 meals per day.      Depression Screen PHQ 2/9 Scores 09/29/2016 04/06/2016 12/07/2015 07/13/2015 05/21/2015 03/25/2015 03/09/2015  PHQ - 2 Score 0 - 0 0 0  0 0  PHQ- 9 Score - - - - - - -  Not completed - Has dementia. - - - - -    Fall Risk Fall Risk  09/29/2016 12/07/2015 07/13/2015 05/21/2015 03/25/2015  Falls in the past year? No No No No No  Number falls in past yr: - - - - -  Injury with Fall? - - - - -  Risk for fall due to : - - - - -    Cognitive Function: MMSE - Mini Mental State Exam 09/29/2016 03/25/2015  Orientation to time 0 2  Orientation to time comments Not oriented to time at all -  Orientation to Place 5 5  Registration 3 3  Attention/ Calculation 5 5  Recall 0 0  Recall-comments Didn't recall being asked to remember 3 words -  Language- name 2 objects 2 2  Language- repeat 1 1  Language- follow 3 step command 3 3  Language- read & follow direction 1 1  Write a sentence 1 1  Copy design 1 1  Total score 22 24  patient has been diagnosed with dementia      Screening Tests Health Maintenance  Topic Date Due  . TETANUS/TDAP  03/16/1948  . DEXA SCAN  03/16/1994  . PNA vac Low Risk Adult (1 of 2 - PCV13) 03/16/1994  . INFLUENZA VACCINE  09/28/2016      Plan:  Eat 3 meals per day. Include more lean protein, fruits, and vegetables.  Walk for 30 minutes at least 3 times per week. Move carefully to avoid falls.  Appt scheduled with Evette Doffing for a CPE on 11/09/16.  I have personally reviewed and noted the following in the  patient's chart:   . Medical and social history . Use of alcohol, tobacco or illicit drugs  . Current medications and supplements . Functional ability and status . Nutritional status . Physical activity . Advanced directives . List of other physicians . Hospitalizations, surgeries, and ER visits in previous 12 months . Vitals . Screenings to include cognitive, depression, and falls . Referrals and appointments  In addition, I have reviewed and discussed with patient certain preventive protocols, quality metrics, and best practice recommendations. A written personalized care plan for preventive services as well as general preventive health recommendations were provided to patient.     Chong Sicilian, RN   09/30/2016    I have reviewed and agree with the above AWV documentation.   Assunta Found, MD [facility] 09/30/2016, 11:58 AM

## 2016-10-10 IMAGING — CR DG CHEST 2V
2 series · 2 of 2 positions shown · non-contrast
Comparison: None.

CLINICAL DATA: Vomiting and hypoxia today.

EXAM:
CHEST  2 VIEW

[w chest lat]
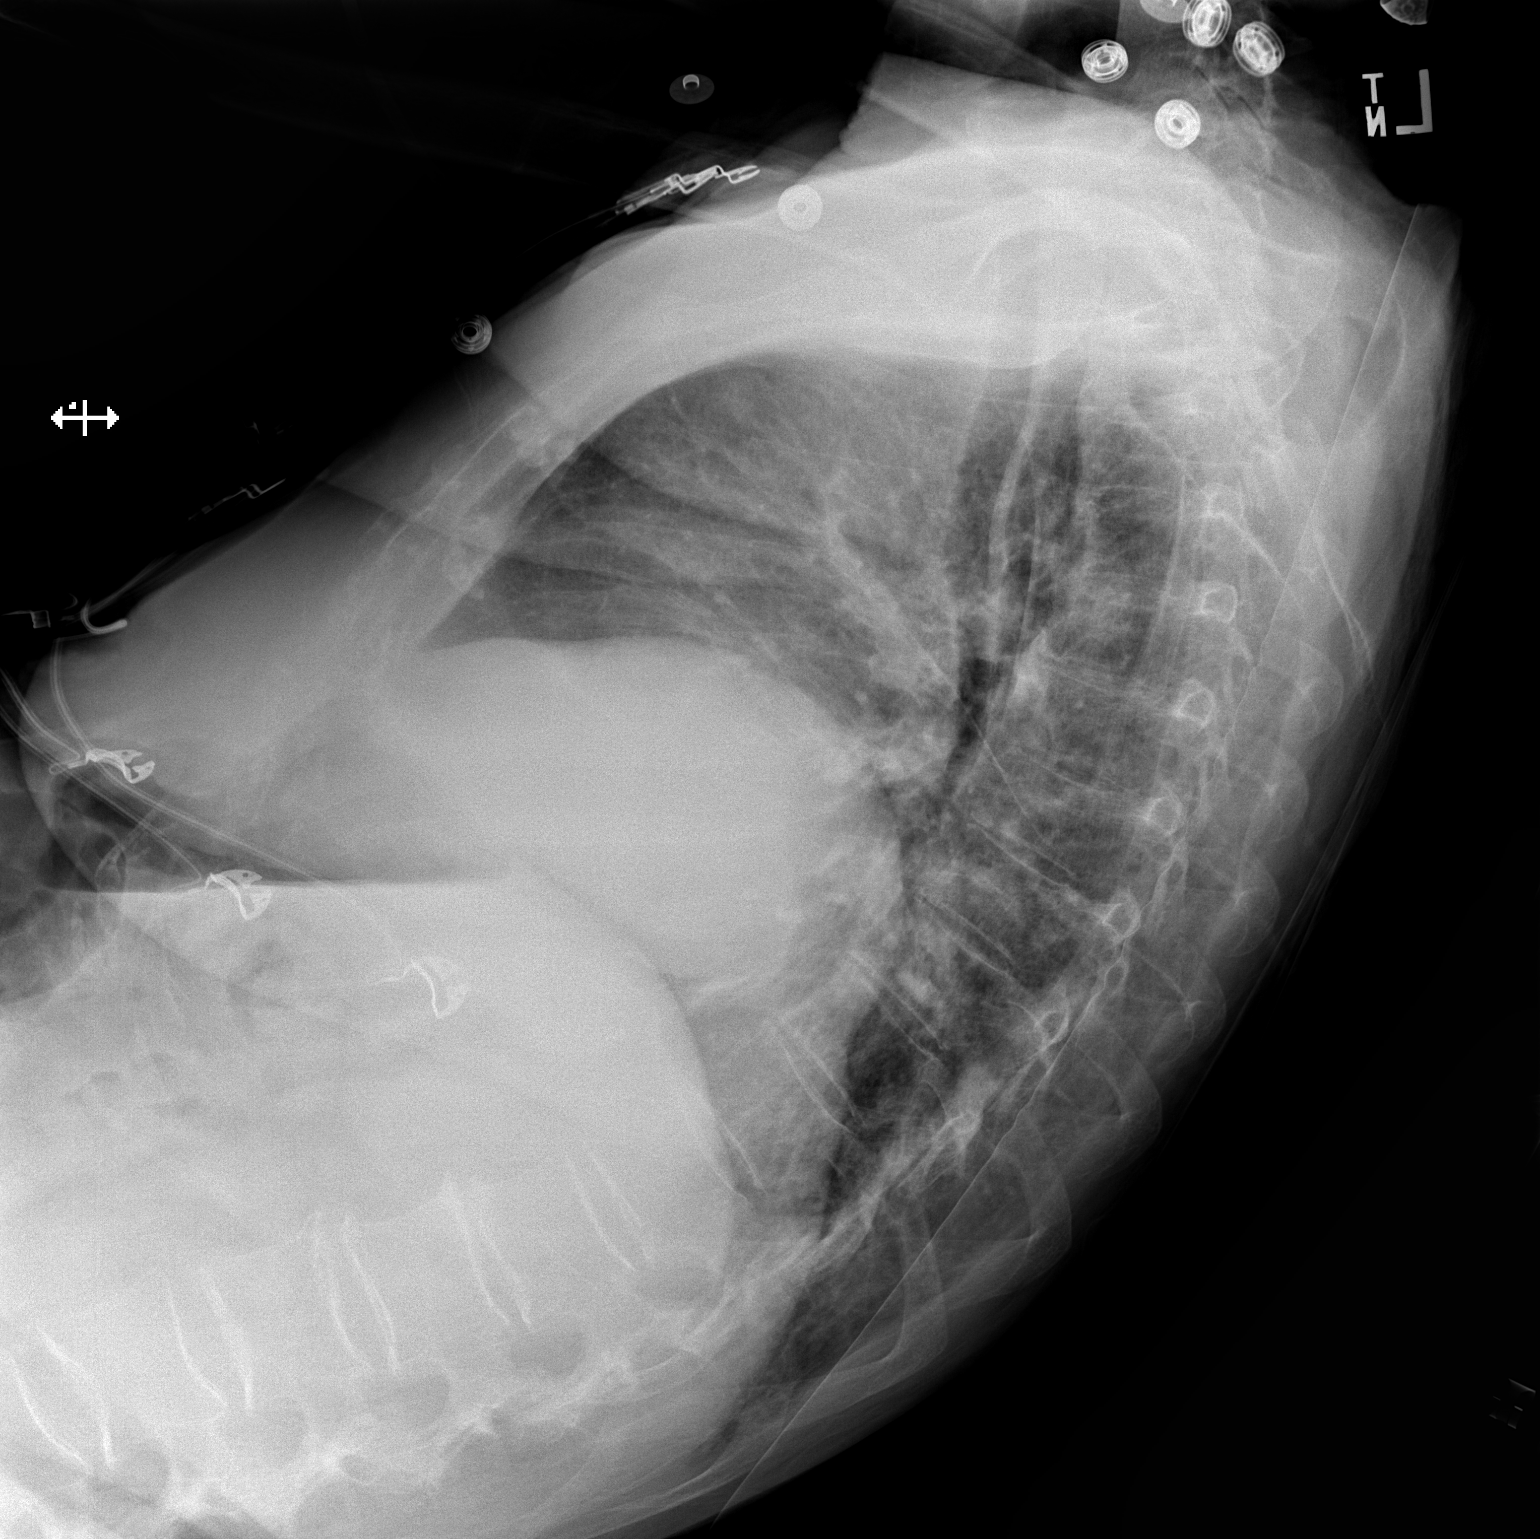

[x chest ap]
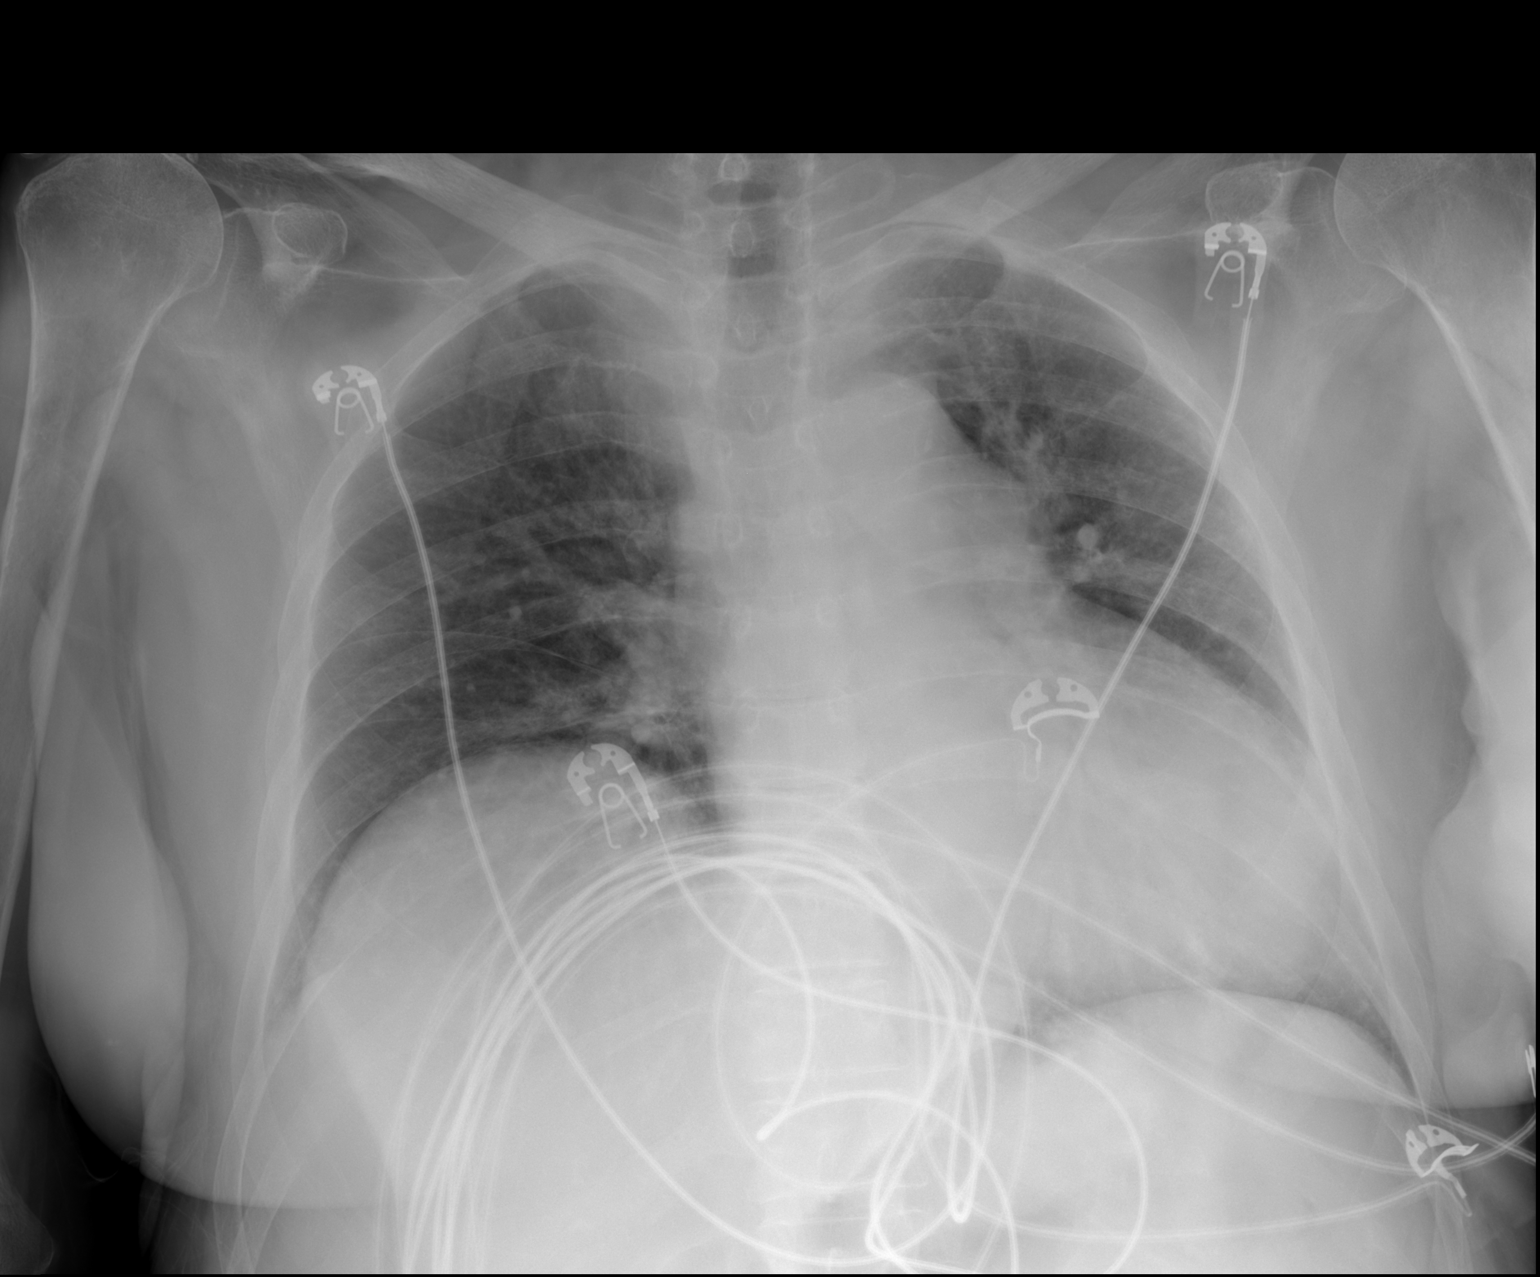

[2 of 2 positions shown; findings below may reference images not displayed]

FINDINGS: The mediastinal contour is normal. The heart size is enlarged. Both
lungs are clear. There is elevation of the right hemidiaphragm. The
visualized skeletal structures are unremarkable.
IMPRESSION: No active cardiopulmonary disease.

## 2016-10-10 IMAGING — CT CT HEAD W/O CM
2 series · 16 of 30 positions shown, 20 images · non-contrast
Comparison: None.

CLINICAL DATA: Altered mental status and vomiting.

EXAM:
CT HEAD WITHOUT CONTRAST
TECHNIQUE: Contiguous axial images were obtained from the base of the skull
through the vertex without intravenous contrast.

[Series 2: head w/o · axial · non-contrast · 0.42mm/px · z∈[-115,+20]mm · 13 of 33 slices shown, 17 images]
[im 3/33  brain]
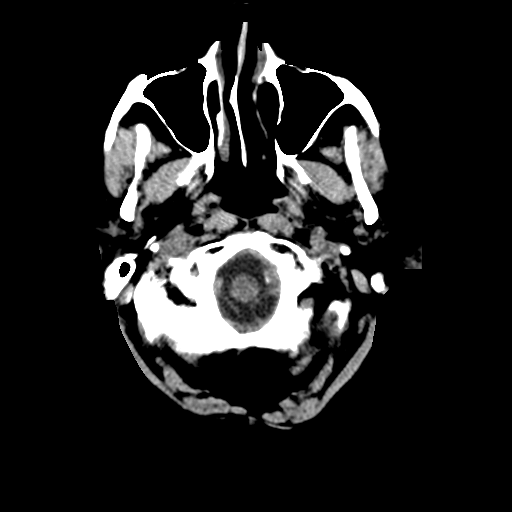
[im 3/33  bone]
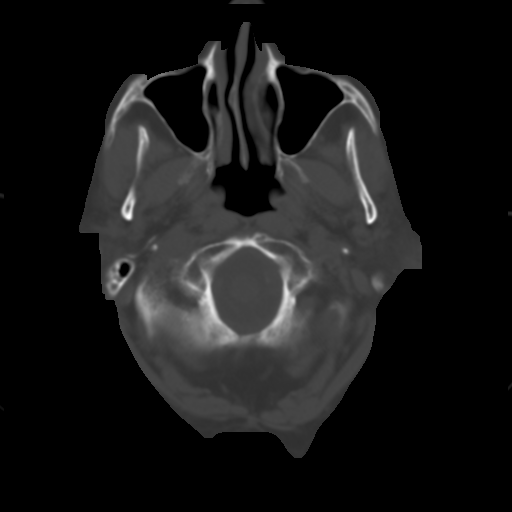
[im 5/33  brain]
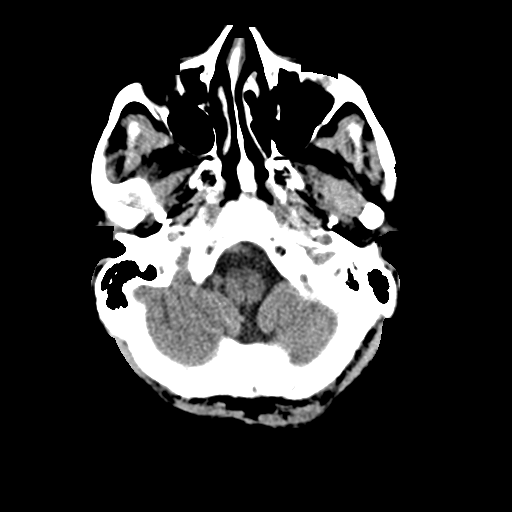
[im 7/33  brain]
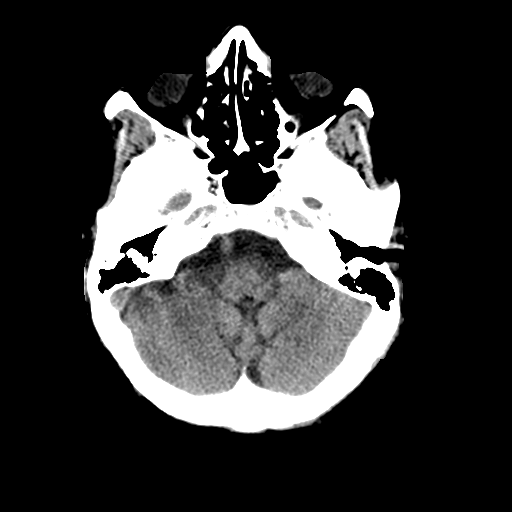
[im 10/33  brain]
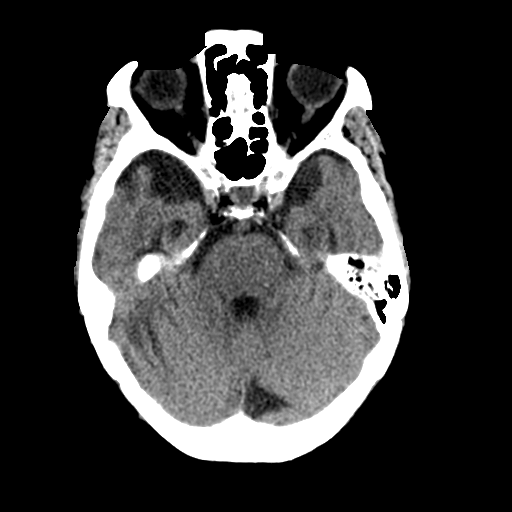
[im 12/33  brain]
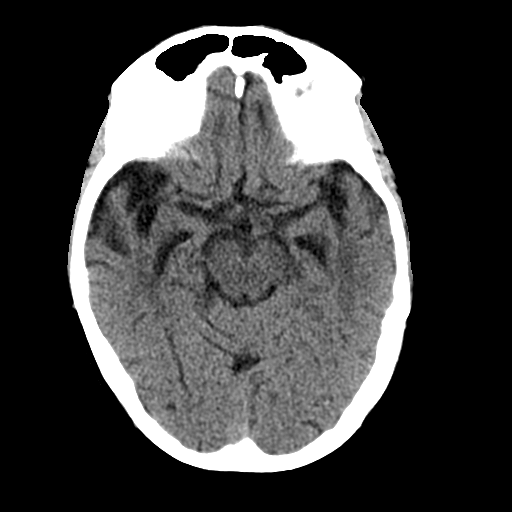
[im 12/33  bone]
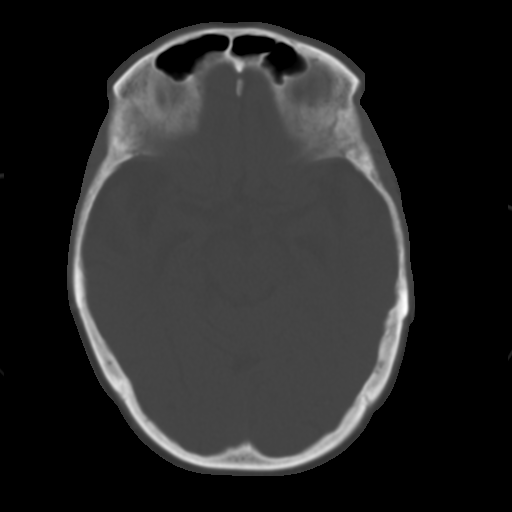
[im 14/33  brain]
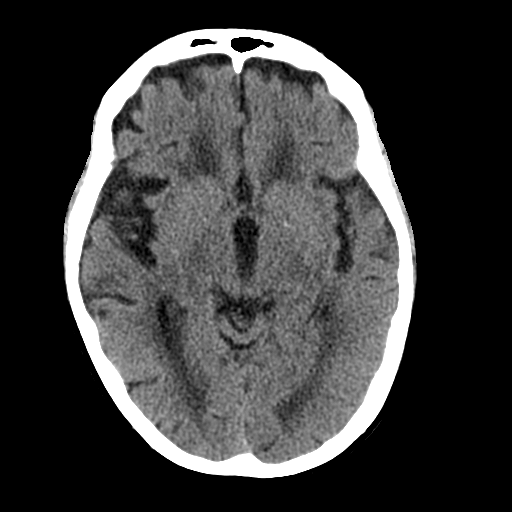
[im 17/33  brain]
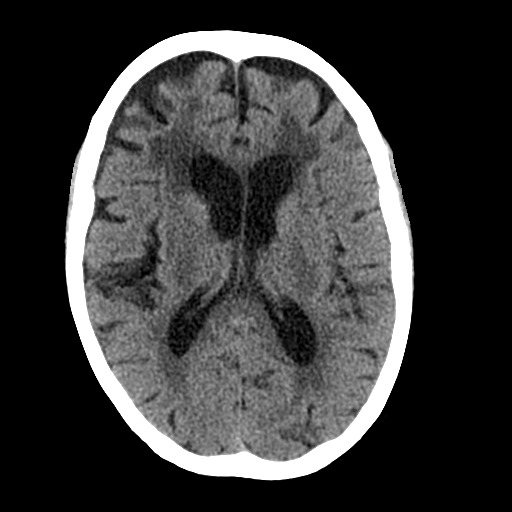
[im 19/33  brain]
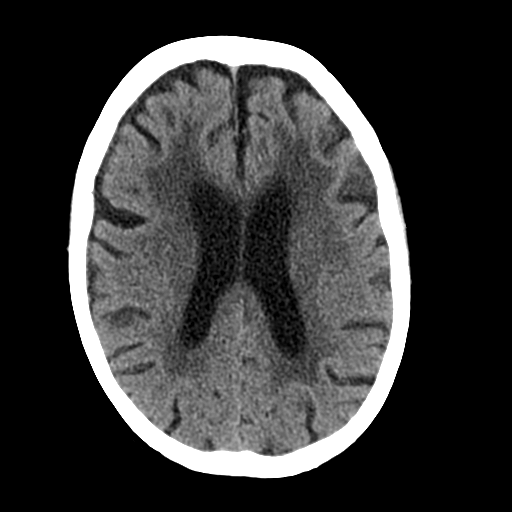
[im 21/33  brain]
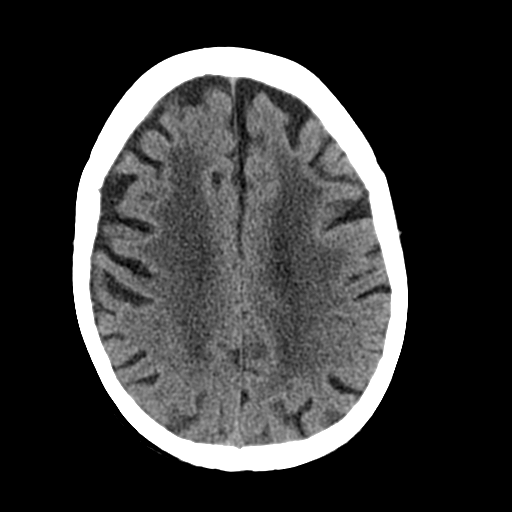
[im 21/33  bone]
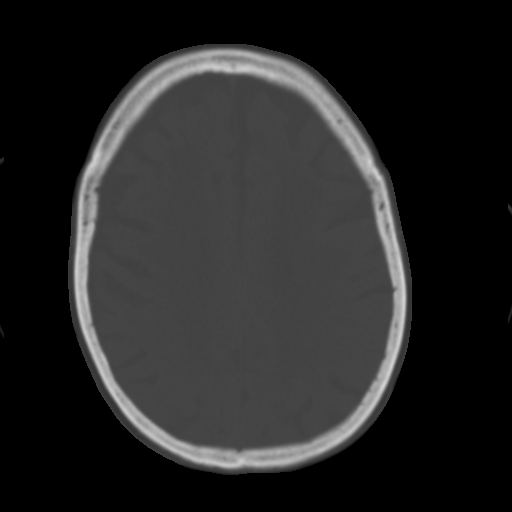
[im 23/33  brain]
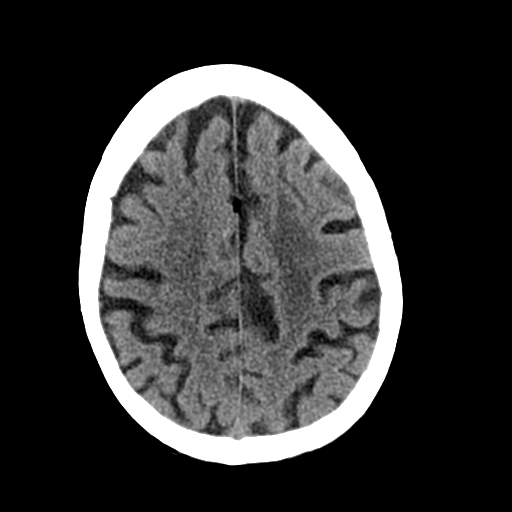
[im 26/33  brain]
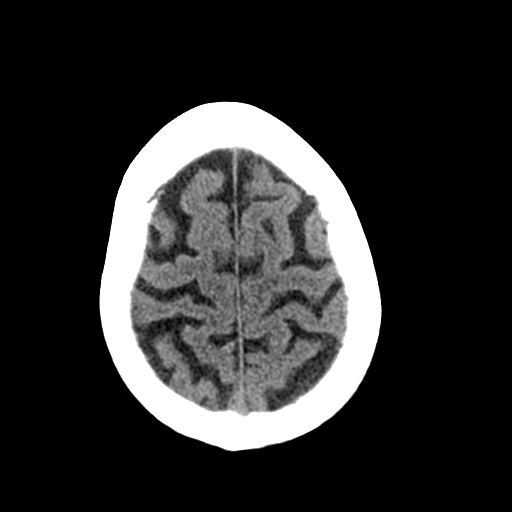
[im 28/33  brain]
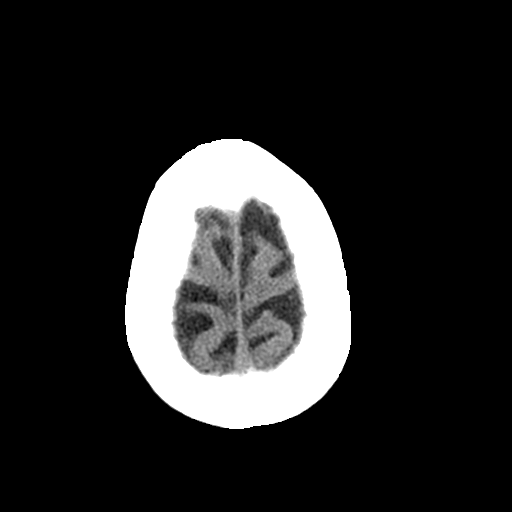
[im 30/33  brain]
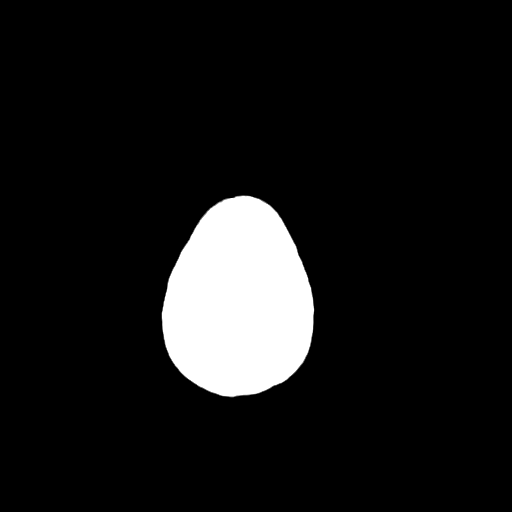
[im 30/33  bone]
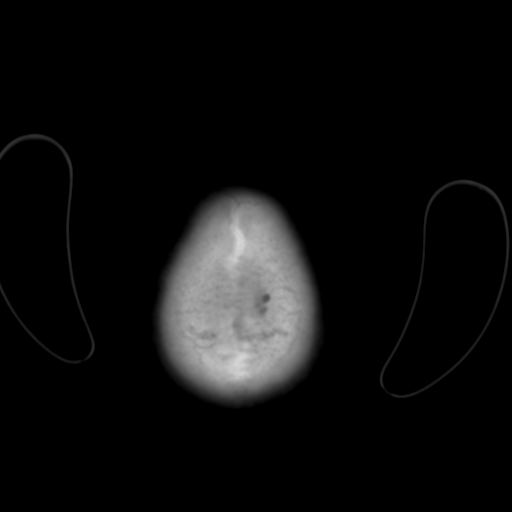

[Series 3: bone windows · axial · 0.42mm/px · z∈[-115,-70]mm · 3 of 33 slices shown]
[im 3/33  bone]
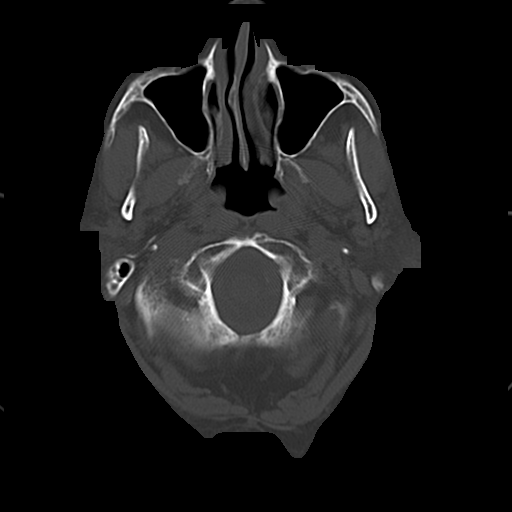
[im 7/33  bone]
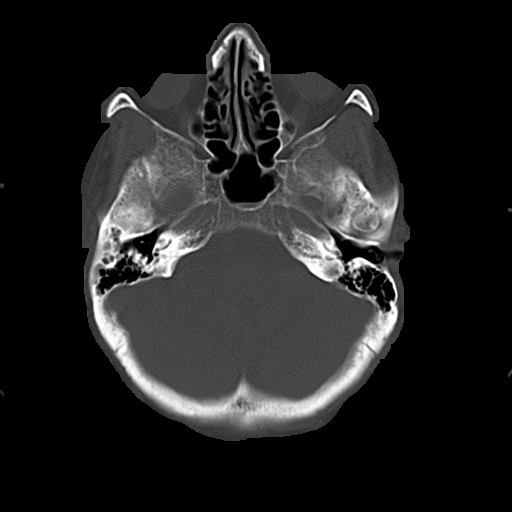
[im 12/33  bone]
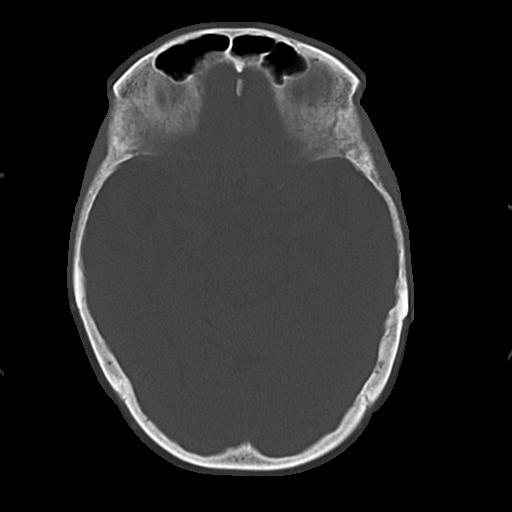

[16 of 30 positions shown; findings below may reference images not displayed]

FINDINGS: Diffuse cerebral atrophy. Mild ventricular dilatation consistent
with central atrophy. Low-attenuation changes in the deep white
matter are nonspecific but likely represent small vessel ischemic
change. No mass effect or midline shift. No abnormal extra-axial
fluid collections. Gray-white matter junctions are distinct. Basal
cisterns are not effaced. No evidence of acute intracranial
hemorrhage. No depressed skull fractures. Visualized paranasal
sinuses and mastoid air cells are not opacified. Vascular
calcifications.
IMPRESSION: No acute intracranial abnormalities. Chronic atrophy and small
vessel ischemic changes.

## 2016-11-04 ENCOUNTER — Other Ambulatory Visit: Payer: Self-pay | Admitting: Urology

## 2016-11-04 ENCOUNTER — Ambulatory Visit (HOSPITAL_COMMUNITY)
Admission: RE | Admit: 2016-11-04 | Discharge: 2016-11-04 | Disposition: A | Discharge status: Home / Self Care | Payer: Medicare Other | Source: Ambulatory Visit | Attending: Urology | Admitting: Urology

## 2016-11-04 DIAGNOSIS — C669 Malignant neoplasm of unspecified ureter: Secondary | ICD-10-CM | POA: Diagnosis not present

## 2016-11-04 DIAGNOSIS — D4121 Neoplasm of uncertain behavior of right ureter: Secondary | ICD-10-CM

## 2016-11-04 DIAGNOSIS — N2889 Other specified disorders of kidney and ureter: Secondary | ICD-10-CM | POA: Diagnosis not present

## 2016-11-04 DIAGNOSIS — D4102 Neoplasm of uncertain behavior of left kidney: Secondary | ICD-10-CM

## 2016-11-04 DIAGNOSIS — R079 Chest pain, unspecified: Secondary | ICD-10-CM | POA: Diagnosis not present

## 2016-11-09 ENCOUNTER — Ambulatory Visit (INDEPENDENT_AMBULATORY_CARE_PROVIDER_SITE_OTHER): Payer: Medicare Other

## 2016-11-09 ENCOUNTER — Ambulatory Visit (INDEPENDENT_AMBULATORY_CARE_PROVIDER_SITE_OTHER): Payer: Medicare Other | Admitting: Pediatrics

## 2016-11-09 ENCOUNTER — Encounter: Payer: Self-pay | Admitting: Pediatrics

## 2016-11-09 VITALS — BP 135/80 | HR 63 | Temp 97.7°F | Ht 62.0 in | Wt 132.6 lb

## 2016-11-09 DIAGNOSIS — N183 Chronic kidney disease, stage 3 unspecified: Secondary | ICD-10-CM

## 2016-11-09 DIAGNOSIS — F0390 Unspecified dementia without behavioral disturbance: Secondary | ICD-10-CM

## 2016-11-09 DIAGNOSIS — Z780 Asymptomatic menopausal state: Secondary | ICD-10-CM

## 2016-11-09 DIAGNOSIS — Z0000 Encounter for general adult medical examination without abnormal findings: Secondary | ICD-10-CM | POA: Diagnosis not present

## 2016-11-09 DIAGNOSIS — E538 Deficiency of other specified B group vitamins: Secondary | ICD-10-CM

## 2016-11-09 MED ORDER — MEMANTINE HCL 5 MG PO TABS: ORAL_TABLET | ORAL | 0 refills | Status: DC

## 2016-11-09 NOTE — Progress Notes (Signed)
Subjective:   Patient ID: Katie Huerta, female    DOB: 1927/12/19, 83 y.o.   MRN: [id number] CC: Annual Exam  HPI: Katie Huerta is a 83 y.o. female presenting for Annual Exam  Here today with her husband  Had imaging last week with urology, h/o ureteral mass s/p resection Husband thinks he has noticed small amount of red in toilet about a month ago, nothing since then Has f/u appt with urology in 2 weeks  Husband concerned about her diet and her memory Eating donuts and potato chips for breakfast Banana sandwich for lunch, sometimes nabs sometimes eats at rec center for lunch Eats varied diet at dinner Husband or other family does most of food prep  Does laundry three times a week Husband does most of cooking Watches TV every night Repeats herself often  Says she has been feeling well No CP, no SOB  MMSE - Mini Mental State Exam 09/29/2016 03/25/2015  Orientation to time 0 2  Orientation to time comments Not oriented to time at all -  Orientation to Place 5 5  Registration 3 3  Attention/ Calculation 5 5  Recall 0 0  Recall-comments Didn't recall being asked to remember 3 words -  Language- name 2 objects 2 2  Language- repeat 1 1  Language- follow 3 step command 3 3  Language- read & follow direction 1 1  Write a sentence 1 1  Copy design 1 1  Total score 22 24     Relevant past medical, surgical, family and social history reviewed. Allergies and medications reviewed and updated. History  Smoking Status  . Former Smoker  . Years: 10.00  . Types: Cigarettes  . Quit date: 10/27/1955  Smokeless Tobacco  . Never Used   ROS: Per HPI   Objective:    BP 135/80   Pulse 63   Temp 97.7 F (36.5 C) (Oral)   Ht '5\' 2"'$  (1.575 m)   Wt 132 lb 9.6 oz (60.1 kg)   BMI 24.25 kg/m   Wt Readings from Last 3 Encounters:  11/09/16 132 lb 9.6 oz (60.1 kg)  09/29/16 134 lb (60.8 kg)  04/06/16 132 lb (59.9 kg)    Gen: NAD, alert, cooperative with exam,  NCAT EYES: EOMI, no conjunctival injection, or no icterus ENT:  OP without erythema LYMPH: no cervical LAD CV: NRRR, normal S1/S2, no murmur, distal pulses 2+ b/l Resp: CTABL, no wheezes, normal WOB Abd: +BS, soft, NTND. no guarding or organomegaly Ext: No edema, warm Neuro: Alert and oriented to place, person, strength equal b/l UE and LE, coordination grossly normal MSK: normal muscle bulk  Assessment & Plan:  Katie Huerta was seen today for annual exam.  Diagnoses and all orders for this visit:  Encounter for preventive health examination  Vitamin B 12 deficiency -     BMP8+EGFR -     Vitamin B12 -     VITAMIN D 25 Hydroxy (Vit-D Deficiency, Fractures) -     Folate  Dementia without behavioral disturbance, unspecified dementia type Stable, husband does much of house work Family helping some Pt can do most of her own ADLs otherwise -     BMP8+EGFR -     Vitamin B12 -     VITAMIN D 25 Hydroxy (Vit-D Deficiency, Fractures) -     Folate -     memantine (NAMENDA) 5 MG tablet; Take once in the morning for 1 week, then take twice a day, morning and night  Post-menopausal  Osteoporosis  Cr up, hesitant to start bisphosphonates Cont vitamin D and calcium -     DG [facility] DEXA  Stage 3 chronic kidney disease   Follow up plan: Return in about 3 months (around 02/08/2017). Assunta Found, MD [facility]

## 2016-11-11 LAB — BMP8+EGFR
BUN / CREAT RATIO: 14 (ref 12–28)
BUN: 18 mg/dL (ref 8–27)
CO2: 22 mmol/L (ref 20–29)
Calcium: 9.2 mg/dL (ref 8.7–10.3)
Chloride: 106 mmol/L (ref 96–106)
Creatinine, Ser: 1.26 mg/dL — ABNORMAL HIGH (ref 0.57–1.00)
GFR calc Af Amer: 44 mL/min/{1.73_m2} — ABNORMAL LOW (ref >59)
GFR calc non Af Amer: 38 mL/min/{1.73_m2} — ABNORMAL LOW (ref >59)
GLUCOSE: 91 mg/dL (ref 65–99)
POTASSIUM: 4.3 mmol/L (ref 3.5–5.2)
Sodium: 144 mmol/L (ref 134–144)

## 2016-11-11 LAB — VITAMIN B12: VITAMIN B 12: 1819 pg/mL — AB (ref 232–1245)

## 2016-11-11 LAB — VITAMIN D 25 HYDROXY (VIT D DEFICIENCY, FRACTURES): Vit D, 25-Hydroxy: 23.2 ng/mL — ABNORMAL LOW (ref 30.0–100.0)

## 2016-11-11 LAB — FOLATE: Folate: 8.5 ng/mL (ref >3.0)

## 2016-11-12 DIAGNOSIS — N183 Chronic kidney disease, stage 3 unspecified: Secondary | ICD-10-CM | POA: Insufficient documentation

## 2016-11-29 DIAGNOSIS — D4121 Neoplasm of uncertain behavior of right ureter: Secondary | ICD-10-CM | POA: Diagnosis not present

## 2016-11-29 DIAGNOSIS — D4102 Neoplasm of uncertain behavior of left kidney: Secondary | ICD-10-CM | POA: Diagnosis not present

## 2016-12-07 DIAGNOSIS — H43813 Vitreous degeneration, bilateral: Secondary | ICD-10-CM | POA: Diagnosis not present

## 2016-12-07 DIAGNOSIS — H35423 Microcystoid degeneration of retina, bilateral: Secondary | ICD-10-CM | POA: Diagnosis not present

## 2016-12-07 DIAGNOSIS — H353211 Exudative age-related macular degeneration, right eye, with active choroidal neovascularization: Secondary | ICD-10-CM | POA: Diagnosis not present

## 2016-12-07 DIAGNOSIS — H353222 Exudative age-related macular degeneration, left eye, with inactive choroidal neovascularization: Secondary | ICD-10-CM | POA: Diagnosis not present

## 2016-12-14 ENCOUNTER — Ambulatory Visit (INDEPENDENT_AMBULATORY_CARE_PROVIDER_SITE_OTHER): Payer: Medicare Other

## 2016-12-14 DIAGNOSIS — Z23 Encounter for immunization: Secondary | ICD-10-CM | POA: Diagnosis not present

## 2017-01-04 DIAGNOSIS — H43813 Vitreous degeneration, bilateral: Secondary | ICD-10-CM | POA: Diagnosis not present

## 2017-01-04 DIAGNOSIS — H35423 Microcystoid degeneration of retina, bilateral: Secondary | ICD-10-CM | POA: Diagnosis not present

## 2017-01-04 DIAGNOSIS — H353222 Exudative age-related macular degeneration, left eye, with inactive choroidal neovascularization: Secondary | ICD-10-CM | POA: Diagnosis not present

## 2017-01-04 DIAGNOSIS — H353211 Exudative age-related macular degeneration, right eye, with active choroidal neovascularization: Secondary | ICD-10-CM | POA: Diagnosis not present

## 2017-02-07 DIAGNOSIS — H43813 Vitreous degeneration, bilateral: Secondary | ICD-10-CM | POA: Diagnosis not present

## 2017-02-07 DIAGNOSIS — H35423 Microcystoid degeneration of retina, bilateral: Secondary | ICD-10-CM | POA: Diagnosis not present

## 2017-02-07 DIAGNOSIS — H353222 Exudative age-related macular degeneration, left eye, with inactive choroidal neovascularization: Secondary | ICD-10-CM | POA: Diagnosis not present

## 2017-02-07 DIAGNOSIS — H353211 Exudative age-related macular degeneration, right eye, with active choroidal neovascularization: Secondary | ICD-10-CM | POA: Diagnosis not present

## 2017-03-27 DIAGNOSIS — D4121 Neoplasm of uncertain behavior of right ureter: Secondary | ICD-10-CM | POA: Diagnosis not present

## 2017-03-27 DIAGNOSIS — N3020 Other chronic cystitis without hematuria: Secondary | ICD-10-CM | POA: Diagnosis not present

## 2017-03-27 DIAGNOSIS — R310 Gross hematuria: Secondary | ICD-10-CM | POA: Diagnosis not present

## 2017-04-04 DIAGNOSIS — H353222 Exudative age-related macular degeneration, left eye, with inactive choroidal neovascularization: Secondary | ICD-10-CM | POA: Diagnosis not present

## 2017-04-04 DIAGNOSIS — H43813 Vitreous degeneration, bilateral: Secondary | ICD-10-CM | POA: Diagnosis not present

## 2017-04-04 DIAGNOSIS — H353211 Exudative age-related macular degeneration, right eye, with active choroidal neovascularization: Secondary | ICD-10-CM | POA: Diagnosis not present

## 2017-04-04 DIAGNOSIS — H35423 Microcystoid degeneration of retina, bilateral: Secondary | ICD-10-CM | POA: Diagnosis not present

## 2017-05-15 DIAGNOSIS — H35423 Microcystoid degeneration of retina, bilateral: Secondary | ICD-10-CM | POA: Diagnosis not present

## 2017-05-15 DIAGNOSIS — H353211 Exudative age-related macular degeneration, right eye, with active choroidal neovascularization: Secondary | ICD-10-CM | POA: Diagnosis not present

## 2017-05-15 DIAGNOSIS — H353222 Exudative age-related macular degeneration, left eye, with inactive choroidal neovascularization: Secondary | ICD-10-CM | POA: Diagnosis not present

## 2017-05-15 DIAGNOSIS — H43813 Vitreous degeneration, bilateral: Secondary | ICD-10-CM | POA: Diagnosis not present

## 2017-05-22 ENCOUNTER — Other Ambulatory Visit: Payer: Self-pay | Admitting: Urology

## 2017-05-22 ENCOUNTER — Ambulatory Visit (HOSPITAL_COMMUNITY)
Admission: RE | Admit: 2017-05-22 | Discharge: 2017-05-22 | Disposition: A | Discharge status: Home / Self Care | Payer: Medicare Other | Source: Ambulatory Visit | Attending: Urology | Admitting: Urology

## 2017-05-22 DIAGNOSIS — D4102 Neoplasm of uncertain behavior of left kidney: Secondary | ICD-10-CM | POA: Diagnosis not present

## 2017-05-22 DIAGNOSIS — D4121 Neoplasm of uncertain behavior of right ureter: Secondary | ICD-10-CM

## 2017-05-22 DIAGNOSIS — R310 Gross hematuria: Secondary | ICD-10-CM | POA: Diagnosis not present

## 2017-05-22 DIAGNOSIS — I517 Cardiomegaly: Secondary | ICD-10-CM | POA: Insufficient documentation

## 2017-05-22 DIAGNOSIS — Q791 Other congenital malformations of diaphragm: Secondary | ICD-10-CM | POA: Diagnosis not present

## 2017-05-22 DIAGNOSIS — K8020 Calculus of gallbladder without cholecystitis without obstruction: Secondary | ICD-10-CM | POA: Diagnosis not present

## 2017-05-29 DIAGNOSIS — D4121 Neoplasm of uncertain behavior of right ureter: Secondary | ICD-10-CM | POA: Diagnosis not present

## 2017-05-29 DIAGNOSIS — Z905 Acquired absence of kidney: Secondary | ICD-10-CM | POA: Diagnosis not present

## 2017-05-29 DIAGNOSIS — D4102 Neoplasm of uncertain behavior of left kidney: Secondary | ICD-10-CM | POA: Diagnosis not present

## 2017-05-29 DIAGNOSIS — R310 Gross hematuria: Secondary | ICD-10-CM | POA: Diagnosis not present

## 2017-05-30 ENCOUNTER — Other Ambulatory Visit: Payer: Self-pay | Admitting: Urology

## 2017-06-08 ENCOUNTER — Encounter (HOSPITAL_BASED_OUTPATIENT_CLINIC_OR_DEPARTMENT_OTHER): Payer: Self-pay | Admitting: *Deleted

## 2017-06-09 ENCOUNTER — Other Ambulatory Visit: Payer: Self-pay

## 2017-06-09 ENCOUNTER — Encounter (HOSPITAL_BASED_OUTPATIENT_CLINIC_OR_DEPARTMENT_OTHER): Payer: Self-pay | Admitting: *Deleted

## 2017-06-09 NOTE — Progress Notes (Signed)
SPOKE [place], [place], VIA PHONE FOR PRE-OP INTERVIEW.  PT HAS DEMENTIA, HUSBAND TO PRE-OP WITH PT.  NPO AFTER MN.  ARRIVE AT 0930.  ISTAT.  CURRENT EKG AND CXR IN CHART AND Epic.

## 2017-06-21 ENCOUNTER — Encounter (HOSPITAL_BASED_OUTPATIENT_CLINIC_OR_DEPARTMENT_OTHER): Payer: Self-pay | Admitting: *Deleted

## 2017-06-21 ENCOUNTER — Ambulatory Visit (HOSPITAL_BASED_OUTPATIENT_CLINIC_OR_DEPARTMENT_OTHER): Payer: Medicare Other | Admitting: Anesthesiology

## 2017-06-21 ENCOUNTER — Encounter (HOSPITAL_BASED_OUTPATIENT_CLINIC_OR_DEPARTMENT_OTHER)
Admission: RE | Disposition: A | Discharge status: Home / Self Care | Payer: Self-pay | Source: Ambulatory Visit | Attending: Urology

## 2017-06-21 ENCOUNTER — Ambulatory Visit (HOSPITAL_BASED_OUTPATIENT_CLINIC_OR_DEPARTMENT_OTHER)
Admission: RE | Admit: 2017-06-21 | Discharge: 2017-06-21 | Disposition: A | Discharge status: Home / Self Care | Payer: Medicare Other | Source: Ambulatory Visit | Attending: Urology | Admitting: Urology

## 2017-06-21 DIAGNOSIS — Z906 Acquired absence of other parts of urinary tract: Secondary | ICD-10-CM | POA: Insufficient documentation

## 2017-06-21 DIAGNOSIS — Z8553 Personal history of malignant neoplasm of renal pelvis: Secondary | ICD-10-CM | POA: Insufficient documentation

## 2017-06-21 DIAGNOSIS — N3031 Trigonitis with hematuria: Secondary | ICD-10-CM | POA: Insufficient documentation

## 2017-06-21 DIAGNOSIS — Z905 Acquired absence of kidney: Secondary | ICD-10-CM | POA: Diagnosis not present

## 2017-06-21 DIAGNOSIS — D090 Carcinoma in situ of bladder: Secondary | ICD-10-CM | POA: Insufficient documentation

## 2017-06-21 DIAGNOSIS — Z8554 Personal history of malignant neoplasm of ureter: Secondary | ICD-10-CM | POA: Insufficient documentation

## 2017-06-21 DIAGNOSIS — N3289 Other specified disorders of bladder: Secondary | ICD-10-CM | POA: Diagnosis not present

## 2017-06-21 DIAGNOSIS — N183 Chronic kidney disease, stage 3 (moderate): Secondary | ICD-10-CM | POA: Insufficient documentation

## 2017-06-21 DIAGNOSIS — D631 Anemia in chronic kidney disease: Secondary | ICD-10-CM | POA: Diagnosis not present

## 2017-06-21 DIAGNOSIS — F0390 Unspecified dementia without behavioral disturbance: Secondary | ICD-10-CM | POA: Diagnosis not present

## 2017-06-21 DIAGNOSIS — D494 Neoplasm of unspecified behavior of bladder: Secondary | ICD-10-CM | POA: Diagnosis not present

## 2017-06-21 DIAGNOSIS — R319 Hematuria, unspecified: Secondary | ICD-10-CM | POA: Diagnosis not present

## 2017-06-21 DIAGNOSIS — Z87891 Personal history of nicotine dependence: Secondary | ICD-10-CM | POA: Insufficient documentation

## 2017-06-21 DIAGNOSIS — R310 Gross hematuria: Secondary | ICD-10-CM | POA: Diagnosis not present

## 2017-06-21 HISTORY — DX: Dysuria: R30.0

## 2017-06-21 HISTORY — PX: CYSTOSCOPY/RETROGRADE/URETEROSCOPY: SHX5316

## 2017-06-21 HISTORY — DX: Personal history of malignant neoplasm of ureter: Z85.54

## 2017-06-21 HISTORY — DX: Chronic kidney disease, stage 3 unspecified: N18.30

## 2017-06-21 HISTORY — PX: TRANSURETHRAL RESECTION OF BLADDER TUMOR: SHX2575

## 2017-06-21 HISTORY — DX: Chronic kidney disease, stage 3 (moderate): N18.3

## 2017-06-21 HISTORY — DX: Hematuria, unspecified: R31.9

## 2017-06-21 LAB — POCT I-STAT, CHEM 8
BUN: 27 mg/dL — AB (ref 6–20)
CREATININE: 1.2 mg/dL — AB (ref 0.44–1.00)
Calcium, Ion: 1.2 mmol/L (ref 1.15–1.40)
Chloride: 110 mmol/L (ref 101–111)
Glucose, Bld: 86 mg/dL (ref 65–99)
HCT: 35 % — ABNORMAL LOW (ref 36.0–46.0)
Hemoglobin: 11.9 g/dL — ABNORMAL LOW (ref 12.0–15.0)
Potassium: 3.9 mmol/L (ref 3.5–5.1)
SODIUM: 147 mmol/L — AB (ref 135–145)
TCO2: 25 mmol/L (ref 22–32)

## 2017-06-21 SURGERY — CYSTOSCOPY/RETROGRADE/URETEROSCOPY
Anesthesia: General | Site: Bladder

## 2017-06-21 MED ORDER — PROPOFOL 10 MG/ML IV BOLUS
INTRAVENOUS | Status: DC | PRN
  Administered 2017-06-21: 100 mg via INTRAVENOUS

## 2017-06-21 MED ORDER — FENTANYL CITRATE (PF) 100 MCG/2ML IJ SOLN
INTRAMUSCULAR | Status: DC | PRN
  Administered 2017-06-21 (×2): 25 ug via INTRAVENOUS

## 2017-06-21 MED ORDER — DEXAMETHASONE SODIUM PHOSPHATE 4 MG/ML IJ SOLN
INTRAMUSCULAR | Status: DC | PRN
  Administered 2017-06-21: 4 mg via INTRAVENOUS

## 2017-06-21 MED ORDER — GENTAMICIN SULFATE 40 MG/ML IJ SOLN
5.0000 mg/kg | INTRAVENOUS | Status: DC
  Filled 2017-06-21: qty 7.25

## 2017-06-21 MED ORDER — LIDOCAINE 2% (20 MG/ML) 5 ML SYRINGE
INTRAMUSCULAR | Status: DC | PRN
  Administered 2017-06-21: 40 mg via INTRAVENOUS

## 2017-06-21 MED ORDER — SODIUM CHLORIDE 0.9 % IV SOLN
INTRAVENOUS | Status: DC
  Administered 2017-06-21: 11:00:00 via INTRAVENOUS
  Filled 2017-06-21: qty 1000

## 2017-06-21 MED ORDER — ONDANSETRON HCL 4 MG/2ML IJ SOLN
INTRAMUSCULAR | Status: DC | PRN
  Administered 2017-06-21: 4 mg via INTRAVENOUS

## 2017-06-21 MED ORDER — IOHEXOL 300 MG/ML  SOLN
INTRAMUSCULAR | Status: DC | PRN
  Administered 2017-06-21: 10 mL
  Administered 2017-06-21: 7000 mL

## 2017-06-21 MED ORDER — LIDOCAINE 2% (20 MG/ML) 5 ML SYRINGE
INTRAMUSCULAR | Status: AC
  Filled 2017-06-21: qty 5

## 2017-06-21 MED ORDER — GENTAMICIN SULFATE 40 MG/ML IJ SOLN
1.5000 mg/kg | INTRAVENOUS | Status: AC
  Administered 2017-06-21: 90 mg via INTRAVENOUS
  Filled 2017-06-21: qty 2.25

## 2017-06-21 MED ORDER — PROMETHAZINE HCL 25 MG/ML IJ SOLN
6.2500 mg | INTRAMUSCULAR | Status: DC | PRN
  Filled 2017-06-21: qty 1

## 2017-06-21 MED ORDER — TRAMADOL HCL 50 MG PO TABS: 50.0000 mg | ORAL_TABLET | Freq: Four times a day (QID) | ORAL | 0 refills | Status: DC | PRN

## 2017-06-21 MED ORDER — EPHEDRINE SULFATE-NACL 50-0.9 MG/10ML-% IV SOSY
PREFILLED_SYRINGE | INTRAVENOUS | Status: DC | PRN
  Administered 2017-06-21 (×2): 15 mg via INTRAVENOUS

## 2017-06-21 MED ORDER — FENTANYL CITRATE (PF) 100 MCG/2ML IJ SOLN
25.0000 ug | INTRAMUSCULAR | Status: DC | PRN
  Filled 2017-06-21: qty 1

## 2017-06-21 MED ORDER — FENTANYL CITRATE (PF) 100 MCG/2ML IJ SOLN
INTRAMUSCULAR | Status: AC
  Filled 2017-06-21: qty 2

## 2017-06-21 MED ORDER — ONDANSETRON HCL 4 MG/2ML IJ SOLN
INTRAMUSCULAR | Status: AC
  Filled 2017-06-21: qty 2

## 2017-06-21 MED ORDER — PROPOFOL 10 MG/ML IV BOLUS
INTRAVENOUS | Status: AC
  Filled 2017-06-21: qty 20

## 2017-06-21 MED ORDER — DEXAMETHASONE SODIUM PHOSPHATE 10 MG/ML IJ SOLN
INTRAMUSCULAR | Status: AC
  Filled 2017-06-21: qty 1

## 2017-06-21 SURGICAL SUPPLY — 37 items
BAG DRAIN URO-CYSTO SKYTR STRL (DRAIN) ×4 IMPLANT
BAG DRN ANRFLXCHMBR STRAP LEK (BAG)
BAG DRN UROCATH (DRAIN) ×2
BAG URINE DRAINAGE (UROLOGICAL SUPPLIES) IMPLANT
BAG URINE LEG 19OZ MD ST LTX (BAG) IMPLANT
BAG URINE LEG 500ML (DRAIN) IMPLANT
BASKET LASER NITINOL 1.9FR (BASKET) IMPLANT
BSKT STON RTRVL 120 1.9FR (BASKET)
CATH FOLEY 2WAY SLVR  5CC 22FR (CATHETERS)
CATH FOLEY 2WAY SLVR 30CC 20FR (CATHETERS) IMPLANT
CATH FOLEY 2WAY SLVR 5CC 22FR (CATHETERS) IMPLANT
CATH INTERMIT  6FR 70CM (CATHETERS) ×2 IMPLANT
CLOTH BEACON ORANGE TIMEOUT ST (SAFETY) ×4 IMPLANT
ELECT REM PT RETURN 9FT ADLT (ELECTROSURGICAL) ×4
ELECTRODE REM PT RTRN 9FT ADLT (ELECTROSURGICAL) ×2 IMPLANT
EVACUATOR MICROVAS BLADDER (UROLOGICAL SUPPLIES) IMPLANT
FIBER LASER FLEXIVA 365 (UROLOGICAL SUPPLIES) IMPLANT
FIBER LASER TRAC TIP (UROLOGICAL SUPPLIES) IMPLANT
GLOVE BIO SURGEON STRL SZ7.5 (GLOVE) ×4 IMPLANT
GOWN STRL REUS W/ TWL LRG LVL3 (GOWN DISPOSABLE) ×2 IMPLANT
GOWN STRL REUS W/TWL LRG LVL3 (GOWN DISPOSABLE) ×12 IMPLANT
GUIDEWIRE ANG ZIPWIRE 038X150 (WIRE) ×6 IMPLANT
GUIDEWIRE STR DUAL SENSOR (WIRE) ×6 IMPLANT
INFUSOR MANOMETER BAG 3000ML (MISCELLANEOUS) ×4 IMPLANT
IV NS 1000ML (IV SOLUTION) ×4
IV NS 1000ML BAXH (IV SOLUTION) ×2 IMPLANT
IV NS IRRIG 3000ML ARTHROMATIC (IV SOLUTION) ×6 IMPLANT
KIT TURNOVER CYSTO (KITS) ×4 IMPLANT
LOOP CUT BIPOLAR 24F LRG (ELECTROSURGICAL) ×2 IMPLANT
MANIFOLD NEPTUNE II (INSTRUMENTS) ×4 IMPLANT
NS IRRIG 500ML POUR BTL (IV SOLUTION) ×4 IMPLANT
PACK CYSTO (CUSTOM PROCEDURE TRAY) ×4 IMPLANT
SYRINGE 10CC LL (SYRINGE) ×4 IMPLANT
SYRINGE IRR TOOMEY STRL 70CC (SYRINGE) IMPLANT
TUBE CONNECTING 12'X1/4 (SUCTIONS)
TUBE CONNECTING 12X1/4 (SUCTIONS) IMPLANT
TUBE FEEDING 8FR 16IN STR KANG (MISCELLANEOUS) ×2 IMPLANT

## 2017-06-21 NOTE — Anesthesia Procedure Notes (Signed)
Procedure Name: LMA Insertion Date/Time: 06/21/2017 12:00 PM Performed by: Myrtie Soman, MD Pre-anesthesia Checklist: Patient identified, Emergency Drugs available, Suction available and Patient being monitored Patient Re-evaluated:Patient Re-evaluated prior to induction Oxygen Delivery Method: Circle system utilized Preoxygenation: Pre-oxygenation with 100% oxygen Induction Type: IV induction Ventilation: Mask ventilation without difficulty LMA: LMA inserted LMA Size: 4.0 Number of attempts: 1 Airway Equipment and Method: Bite block Placement Confirmation: positive ETCO2 Tube secured with: Tape Dental Injury: Teeth and Oropharynx as per pre-operative assessment

## 2017-06-21 NOTE — Brief Op Note (Signed)
06/21/2017  12:34 PM  PATIENT:  Katie Huerta  84 y.o. female  PRE-OPERATIVE DIAGNOSIS:  HEMATURIA, HISTORY OF RIGHT URETERAL CANCER  POST-OPERATIVE DIAGNOSIS:  HEMATURIA, HISTORY OF RIGHT URETERAL CANCER  PROCEDURE:  Procedure(s): BILATERAL RETROGRADE PYELOGRAMS, POSSIBLE  RIGHT URETEROSCOPY (Bilateral) TRANSURETHRAL RESECTION OF BLADDER TUMOR (TURBT) (N/A)  SURGEON:  Surgeon(s) and Role:    * Alexis Frock, MD - Primary  PHYSICIAN ASSISTANT:   ASSISTANTS: none   ANESTHESIA:   general  EBL:  10 mL   BLOOD ADMINISTERED:none  DRAINS: none   LOCAL MEDICATIONS USED:  NONE  SPECIMEN:  Source of Specimen:  bladder erythema  DISPOSITION OF SPECIMEN:  PATHOLOGY  COUNTS:  YES  TOURNIQUET:  * No tourniquets in log *  DICTATION: .Other Dictation: Dictation Number [id number]  PLAN OF CARE: Discharge to home after PACU  PATIENT DISPOSITION:  PACU - hemodynamically stable.   Delay start of Pharmacological VTE agent (>24hrs) due to surgical blood loss or risk of bleeding: not applicable

## 2017-06-21 NOTE — Discharge Instructions (Signed)
1 - You may have urinary urgency (bladder spasms) and bloody urine on / off x few days. This is normal.  2 - Call MD or go to ER for fever >102, severe pain / nausea / vomiting not relieved by medications, or acute change in medical status   Post Anesthesia Home Care Instructions  Activity: Get plenty of rest for the remainder of the day. A responsible individual must stay with you for 24 hours following the procedure.  For the next 24 hours, DO NOT: -Drive a car -Paediatric nurse -Drink alcoholic beverages -Take any medication unless instructed by your physician -Make any legal decisions or sign important papers.  Meals: Start with liquid foods such as gelatin or soup. Progress to regular foods as tolerated. Avoid greasy, spicy, heavy foods. If nausea and/or vomiting occur, drink only clear liquids until the nausea and/or vomiting subsides. Call your physician if vomiting continues.  Special Instructions/Symptoms: Your throat may feel dry or sore from the anesthesia or the breathing tube placed in your throat during surgery. If this causes discomfort, gargle with warm salt water. The discomfort should disappear within 24 hours.  If you had a scopolamine patch placed behind your ear for the management of post- operative nausea and/or vomiting:  1. The medication in the patch is effective for 72 hours, after which it should be removed.  Wrap patch in a tissue and discard in the trash. Wash hands thoroughly with soap and water. 2. You may remove the patch earlier than 72 hours if you experience unpleasant side effects which may include dry mouth, dizziness or visual disturbances. 3. Avoid touching the patch. Wash your hands with soap and water after contact with the patch.   CYSTOSCOPY HOME CARE INSTRUCTIONS  Activity: Rest for the remainder of the day.  Do not drive or operate equipment today.  You may resume normal activities in one to two days as instructed by your physician.    Meals: Drink plenty of liquids and eat light foods such as gelatin or soup this evening.  You may return to a normal meal plan tomorrow.  Return to Work: You may return to work in one to two days or as instructed by your physician.  Special Instructions / Symptoms: Call your physician if any of these symptoms occur:   -persistent or heavy bleeding  -bleeding which continues after first few urination  -large blood clots that are difficult to pass  -urine stream diminishes or stops completely  -fever equal to or higher than 101 degrees Farenheit.  -cloudy urine with a strong, foul odor  -severe pain  Females should always wipe from front to back after elimination.  You may feel some burning pain when you urinate.  This should disappear with time.  Applying moist heat to the lower abdomen or a hot tub bath may help relieve the pain. \  Follow-Up / Date of Return Visit to Your Physician:   Call for an appointment to arrange follow-up.  Patient Signature:  ________________________________________________________  Nurse's Signature:  ________________________________________________________

## 2017-06-21 NOTE — Transfer of Care (Signed)
Immediate Anesthesia Transfer of Care Note  Patient: Katie Huerta  Procedure(s) Performed: CYSTOSCOPY WITH BILATERAL RETROGRADE PYELOGRAMS, WITH DIAGNOSTIC RIGHT URETEROSCOPY (Bilateral Bladder) TRANSURETHRAL RESECTION OF BLADDER TUMOR (TURBT) (N/A Bladder)  Patient Location: PACU  Anesthesia Type:General  Level of Consciousness: awake, alert , oriented and patient cooperative  Airway & Oxygen Therapy: Patient Spontanous Breathing and Patient connected to nasal cannula oxygen  Post-op Assessment: Report given to RN and Post -op Vital signs reviewed and stable  Post vital signs: Reviewed and stable  Last Vitals:  Vitals Value Taken Time  BP    Temp    Pulse 70 06/21/2017 12:46 PM  Resp 14 06/21/2017 12:46 PM  SpO2 100 % 06/21/2017 12:46 PM  Vitals shown include unvalidated device data.  Last Pain:  Vitals:   06/21/17 0932  TempSrc: Oral         Complications: No apparent anesthesia complications

## 2017-06-21 NOTE — H&P (Signed)
Katie Huerta is an 84 y.o. female.    Chief Complaint: Pre-Op Cystoscopy, Bilateral Retrogrades, Right Ureteroscopy  HPI:   1 - Right High Grade Ureteral Cancer - s/p RIGHT robotic nephro-ureterectomy 10/2015 for pT2N0Mx high grade ureteral cancer. Primary near iliac crossing. Formal bladder cuff not performed, dissection taken to level of intramural ureter. Margins negative.   Post-op Surveillance:  - 04/2016 CMP, CXR, CT, Cysto - no recurrence Rt cancer, Cr 1.3, Left mass 2cm (slight progression); 11/2016 CMP, CT, Cysto - no recurrent Rt cancer, stable Lt 2cm mass  -04/2017 CMP, CXR, CT, - no recurrence Rt cancer, some distal Rt ureteral dilation noted, did NOT TOLERATE OFFICE CYSTO, Cr 1.1, Left mass stable.    PMH sig for dementia, appy, benign hyst. She lives independantly with her husband [name]. Her son [name] is very involved at [phone] Her PCP is Caryl Pina MD with [facility].   Today "[name]" is seen for cysto under anesthesia and retrogrades, right ureteroscopy. Most recent UCX negative.     Past Medical History:  Diagnosis Date  . Bradycardia   . CKD (chronic kidney disease), stage III ([place])   . Dementia   . Dysuria   . Hematuria   . 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 History of palpitations   . History of sepsis    07-13-2015  urosepsis  . History of syncope    2013--  per documentation in epic by cardiologist-- dr hochrein -  negative work-up, felt to be vagal episode  . HOH (hard of hearing)    pt want wear wearing aids  . Left renal mass    small solid noted on ct 05/ 2017  . Macular degeneration of both eyes    treatment w/ drops and injection's    Past Surgical History:  Procedure Laterality Date  . APPENDECTOMY  as teen  . CATARACT EXTRACTION W/ INTRAOCULAR LENS  IMPLANT, BILATERAL  2005  .  CYSTOSCOPY W/ URETERAL STENT PLACEMENT Right 07/01/2015   Procedure: CYSTOSCOPY WITH RETROGRADE PYELOGRAM/URETERAL STENT PLACEMENT;  Surgeon: Alexis Frock, MD;  Location: [facility];  Service: Urology;  Laterality: Right;  . CYSTOSCOPY WITH RETROGRADE PYELOGRAM, URETEROSCOPY AND STENT PLACEMENT Bilateral 09/25/2015   Procedure: CYSTOSCOPY WITH BILATERAL RETROGRADE PYELOGRAM, RIGHT URETEROSCOPY WITH BIOPSY AND RIGHT  STENT EXCHANGE;  Surgeon: Alexis Frock, MD;  Location[facility];  Service: Urology;  Laterality: Bilateral;  . HOLMIUM LASER APPLICATION Right 8/65/7846   Procedure: LASER ABLATION OF URETERAL MASS;  Surgeon: Alexis Frock, MD;  Location: [facility];  Service: Urology;  Laterality: Right;  . REFRACTIVE SURGERY Bilateral left 2009/  right 2011  . ROBOT ASSITED LAPAROSCOPIC NEPHROURETERECTOMY Right 11/18/2015   Procedure: XI ROBOT ASSITED LAPAROSCOPIC NEPHROURETERECTOMY  laparoscopic adhesiolysis;  Surgeon: Alexis Frock, MD;  Location: [facility];  Service: Urology;  Laterality: Right;  . TRANSTHORACIC ECHOCARDIOGRAM  07-04-2015   severe LVH,  ef 60-65%,  grade 1 diastolic dysfunction/  trivial AR and MR/  mild LAE/  mild TR/  mild increase PASP 31mmHg  . VAGINAL HYSTERECTOMY  yrs ago   w/  Bilateral Salpingoophorectomy    Family History  Problem Relation Age of Onset  . Coronary artery disease Father 9       Died 28s of an MI  . Heart attack Father   . Coronary artery  disease Brother 8  . Heart attack Brother   . Coronary artery disease Sister 55  . Heart attack Brother   . Heart attack Brother    Social History:  reports that she quit smoking about 61 years ago. Her smoking use included cigarettes. She quit after 10.00 years of use. She has never used smokeless tobacco. She reports that she does not drink alcohol or use drugs.  Allergies:  Allergies  Allergen Reactions  . Penicillins     Unknown childhood reaction    No medications prior  to admission.    No results found for this or any previous visit (from the past 48 hour(s)). No results found.  Review of Systems  Constitutional: Negative.  Negative for chills and fever.  HENT: Negative.   Eyes: Negative.   Respiratory: Negative.   Cardiovascular: Negative.   Gastrointestinal: Negative.   Genitourinary: Negative.   Musculoskeletal: Negative.   Skin: Negative.   Neurological: Negative.   Endo/Heme/Allergies: Negative.   Psychiatric/Behavioral: Negative.   All other systems reviewed and are negative.   Height 5\' 2"  (1.575 m), weight 61.7 kg (136 lb). Physical Exam  Constitutional: She appears well-developed.  HENT:  Head: Normocephalic.  Eyes: Pupils are equal, round, and reactive to light.  Neck: Normal range of motion.  Cardiovascular: Normal rate.  Respiratory: Effort normal.  GI: Soft.  Genitourinary:  Genitourinary Comments: NO CVAT at present.   Musculoskeletal:  Stigmata of moderate dementia, but AOx3.   Neurological: She is alert.  Skin: Skin is warm.  Psychiatric: She has a normal mood and affect.     Assessment/Plan  1 - Right High Grade Ureteral Cancer - proceed as planned with cysto, bilateral retorgrades, likely right ureteroscopy / possible biposy for surveillance of urothelial cancer. Risks, benefits, alternatives, expected peri-op course discussed previously and reiterated today.   Alexis Frock, MD 06/21/2017, 8:07 AM

## 2017-06-21 NOTE — Anesthesia Preprocedure Evaluation (Signed)
Anesthesia Evaluation  Patient identified by MRN, date of birth, ID band Patient awake    Reviewed: Allergy & Precautions, NPO status , Patient's Chart, lab work & pertinent test results  Airway Mallampati: II  TM Distance: >3 FB Neck ROM: Full    Dental no notable dental hx.    Pulmonary neg pulmonary ROS, former smoker,    Pulmonary exam normal breath sounds clear to auscultation       Cardiovascular negative cardio ROS Normal cardiovascular exam Rhythm:Regular Rate:Normal     Neuro/Psych negative neurological ROS  negative psych ROS   GI/Hepatic negative GI ROS, Neg liver ROS,   Endo/Other  negative endocrine ROS  Renal/GU Renal InsufficiencyRenal disease  negative genitourinary   Musculoskeletal negative musculoskeletal ROS (+)   Abdominal   Peds negative pediatric ROS (+)  Hematology negative hematology ROS (+)   Anesthesia Other Findings   Reproductive/Obstetrics negative OB ROS                             Anesthesia Physical Anesthesia Plan  ASA: II  Anesthesia Plan: General   Post-op Pain Management:    Induction: Intravenous  PONV Risk Score and Plan: 3 and Ondansetron, Dexamethasone and Treatment may vary due to age or medical condition  Airway Management Planned: LMA  Additional Equipment:   Intra-op Plan:   Post-operative Plan: Extubation in OR  Informed Consent: I have reviewed the patients History and Physical, chart, labs and discussed the procedure including the risks, benefits and alternatives for the proposed anesthesia with the patient or authorized representative who has indicated his/her understanding and acceptance.   Dental advisory given  Plan Discussed with: CRNA and Surgeon  Anesthesia Plan Comments:         Anesthesia Quick Evaluation

## 2017-06-21 NOTE — Anesthesia Postprocedure Evaluation (Signed)
Anesthesia Post Note  Patient: Katie Huerta  Procedure(s) Performed: CYSTOSCOPY WITH BILATERAL RETROGRADE PYELOGRAMS, WITH DIAGNOSTIC RIGHT URETEROSCOPY (Bilateral Bladder) TRANSURETHRAL RESECTION OF BLADDER TUMOR (TURBT) (N/A Bladder)     Patient location during evaluation: PACU Anesthesia Type: General Level of consciousness: awake and alert Pain management: pain level controlled Vital Signs Assessment: post-procedure vital signs reviewed and stable Respiratory status: spontaneous breathing, nonlabored ventilation, respiratory function stable and patient connected to nasal cannula oxygen Cardiovascular status: blood pressure returned to baseline and stable Postop Assessment: no apparent nausea or vomiting Anesthetic complications: no    Last Vitals:  Vitals:   06/21/17 1245 06/21/17 1300  BP: (!) 169/60 (!) 164/68  Pulse: 80 69  Resp: 13 14  Temp: 36.5 C   SpO2: 100% 98%    Last Pain:  Vitals:   06/21/17 1245  TempSrc:   PainSc: 0-No pain                 Majesty Stehlin S

## 2017-06-22 ENCOUNTER — Encounter (HOSPITAL_BASED_OUTPATIENT_CLINIC_OR_DEPARTMENT_OTHER): Payer: Self-pay | Admitting: Urology

## 2017-06-22 NOTE — Op Note (Signed)
NAME:  Katie Huerta, Katie Huerta               ACCOUNT NO.:  MEDICAL RECORD NO.:  [id number]  LOCATION:                                 FACILITY:  PHYSICIAN:  Alexis Frock, MD          DATE OF BIRTH:  DATE OF PROCEDURE: 06/21/2017                               OPERATIVE REPORT   DIAGNOSES:  Hematuria, history of right ureteral cancer.  PROCEDURES: 1. Cystoscopy with bilateral retrograde pyelograms with     interpretation. 2. Right diagnostic ureteroscopy. 3. Transurethral resection of bladder tumor, volume small.  ESTIMATED BLOOD LOSS:  Nil.  COMPLICATION:  None.  SPECIMEN:  Bladder erythema, highly suspicious for carcinoma in situ for permanent pathology.  FINDINGS: 1. Right hemi-trigone and lateral bladder erythema.  This was friable     and bleeding consistent with likely carcinoma in situ. 2. Unremarkable left retrograde pyelogram. 3. Unremarkable right distal ureteral stump retrograde pyelogram.  INDICATION:  The patient is a very pleasant 84 year old lady with history of significant dementia, also has history of right renal pelvis cancer.  She is status post nephroureterectomy several years ago.  She has been compliant with surveillance with significant assistance from her family especially her husband and her son.  She most recently underwent surveillance evaluation with a CT scan, which did reveal some fullness in the area of prior right distal ureteral stump and she also noted to have some gross hematuria, on and off.  She could not tolerate office cystoscopy due to her cognitive decline.  The overall constellation was worrisome for possible tumor recurrence.  It was felt that operative evaluation with retrograde pyelograms and possible transurethral resection was warranted, she wished to proceed as did her family.  Informed consent was obtained and placed in the medical record.  PROCEDURE IN DETAIL:  The patient was verified.  Procedure being cystoscopy with  bilateral retrograde pyelograms and possible transurethral resection of bladder tumor and diagnostic ureteroscopy was confirmed.  Procedure was carried out.  Time-out was performed. Intravenous antibiotics were administered.  General LMA anesthesia was introduced.  The patient was placed into a low lithotomy position. Sterile field was created by prepping and draping the patient's vagina, introitus and proximal thighs using iodine.  Next, cystourethroscopy was performed using a 22-French rigid cystoscope with offset lens. Inspection of the urinary bladder revealed some diffuse erythema that was friable with bleeding in the right hemi-trigone and lateral area, this was likely consistent with carcinoma in situ.  The left hemi- bladder was unremarkable.  Attention was directed at retrograde pyelogram.  The left ureteral orifice was cannulated with a 6-French end- hole catheter and left left retrograde pyelogram was obtained.  Left retrograde pyelogram demonstrated a single left ureter with single- system left kidney.  No filling defects or narrowing noted.  The right ureteral orifice was then cannulated and a retrograde pyelogram of the ureteral stent was obtained.  This revealed only approximately 3 cm or less residual ureteral stump mostly intramural as anticipated.  Next, semi-rigid ureteroscopy was performed in the distal right ureter alongside a separate Sensor working wire.  There was some erythema at the ureteral orifice and ureteral stump, as anticipated no obvious  large mass or tumor.  Semi-rigid scope was then removed and exchanged for a 26- French resectoscope sheath with visual obturator.  Then, using medium- sized resectoscope loop, a representative biopsy of the bladder erythema was obtained, set aside for permanent pathology, labeled as such, and then fulguration current was applied to the area of bladder erythema in effort to provide local control of suspected carcinoma in  situ. Exquisite care was taken to avoid any direct energy application to her left ureteral orifice and this was uninvolved with the process.  We achieved the goal of procedure today.  Bladder was emptied per cystoscope, procedure was then terminated.  The patient tolerated the procedure well.  There were no immediate periprocedural complications. The patient was taken to the postanesthesia care unit in stable condition.  Her future management will be quite difficult, I do not think she will tolerate induction BCG, likely goal transition to a more palliative mode of therapy with p.r.n. resections as per today.          ______________________________ Alexis Frock, MD     TM/MEDQ  D:  06/21/2017  T:  06/21/2017  Job:  [id number]

## 2017-06-27 DIAGNOSIS — H353222 Exudative age-related macular degeneration, left eye, with inactive choroidal neovascularization: Secondary | ICD-10-CM | POA: Diagnosis not present

## 2017-06-27 DIAGNOSIS — H43813 Vitreous degeneration, bilateral: Secondary | ICD-10-CM | POA: Diagnosis not present

## 2017-06-27 DIAGNOSIS — H35423 Microcystoid degeneration of retina, bilateral: Secondary | ICD-10-CM | POA: Diagnosis not present

## 2017-06-27 DIAGNOSIS — H353211 Exudative age-related macular degeneration, right eye, with active choroidal neovascularization: Secondary | ICD-10-CM | POA: Diagnosis not present

## 2017-07-06 DIAGNOSIS — D4102 Neoplasm of uncertain behavior of left kidney: Secondary | ICD-10-CM | POA: Diagnosis not present

## 2017-07-06 DIAGNOSIS — C678 Malignant neoplasm of overlapping sites of bladder: Secondary | ICD-10-CM | POA: Diagnosis not present

## 2017-07-06 DIAGNOSIS — D4121 Neoplasm of uncertain behavior of right ureter: Secondary | ICD-10-CM | POA: Diagnosis not present

## 2017-08-13 IMAGING — CR DG CHEST 2V
2 series · 2 of 2 positions shown · non-contrast
Comparison: 06/30/2015

CLINICAL DATA: Right ureter neoplasm

EXAM:
CHEST  2 VIEW

[w chest pa]
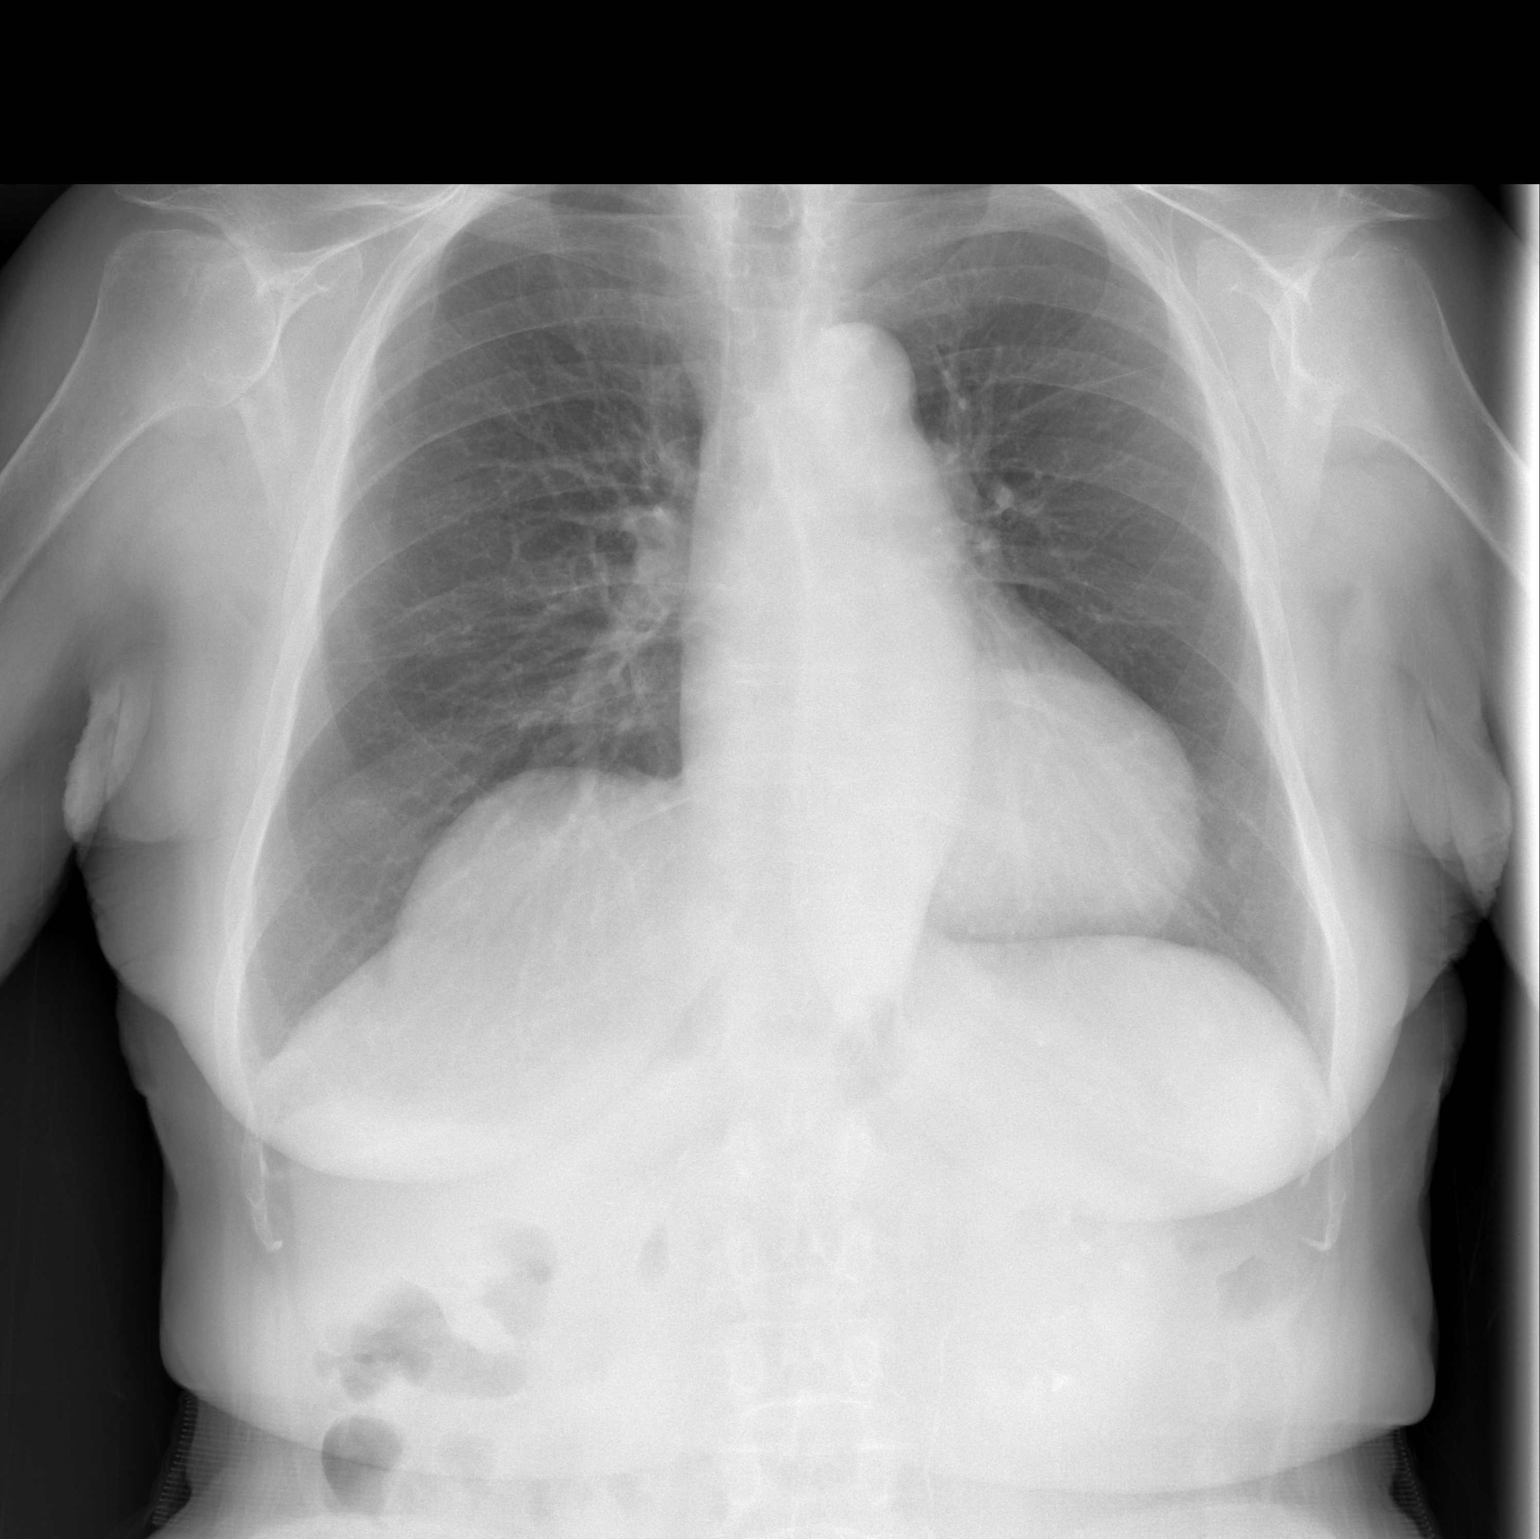

[w chest lat]
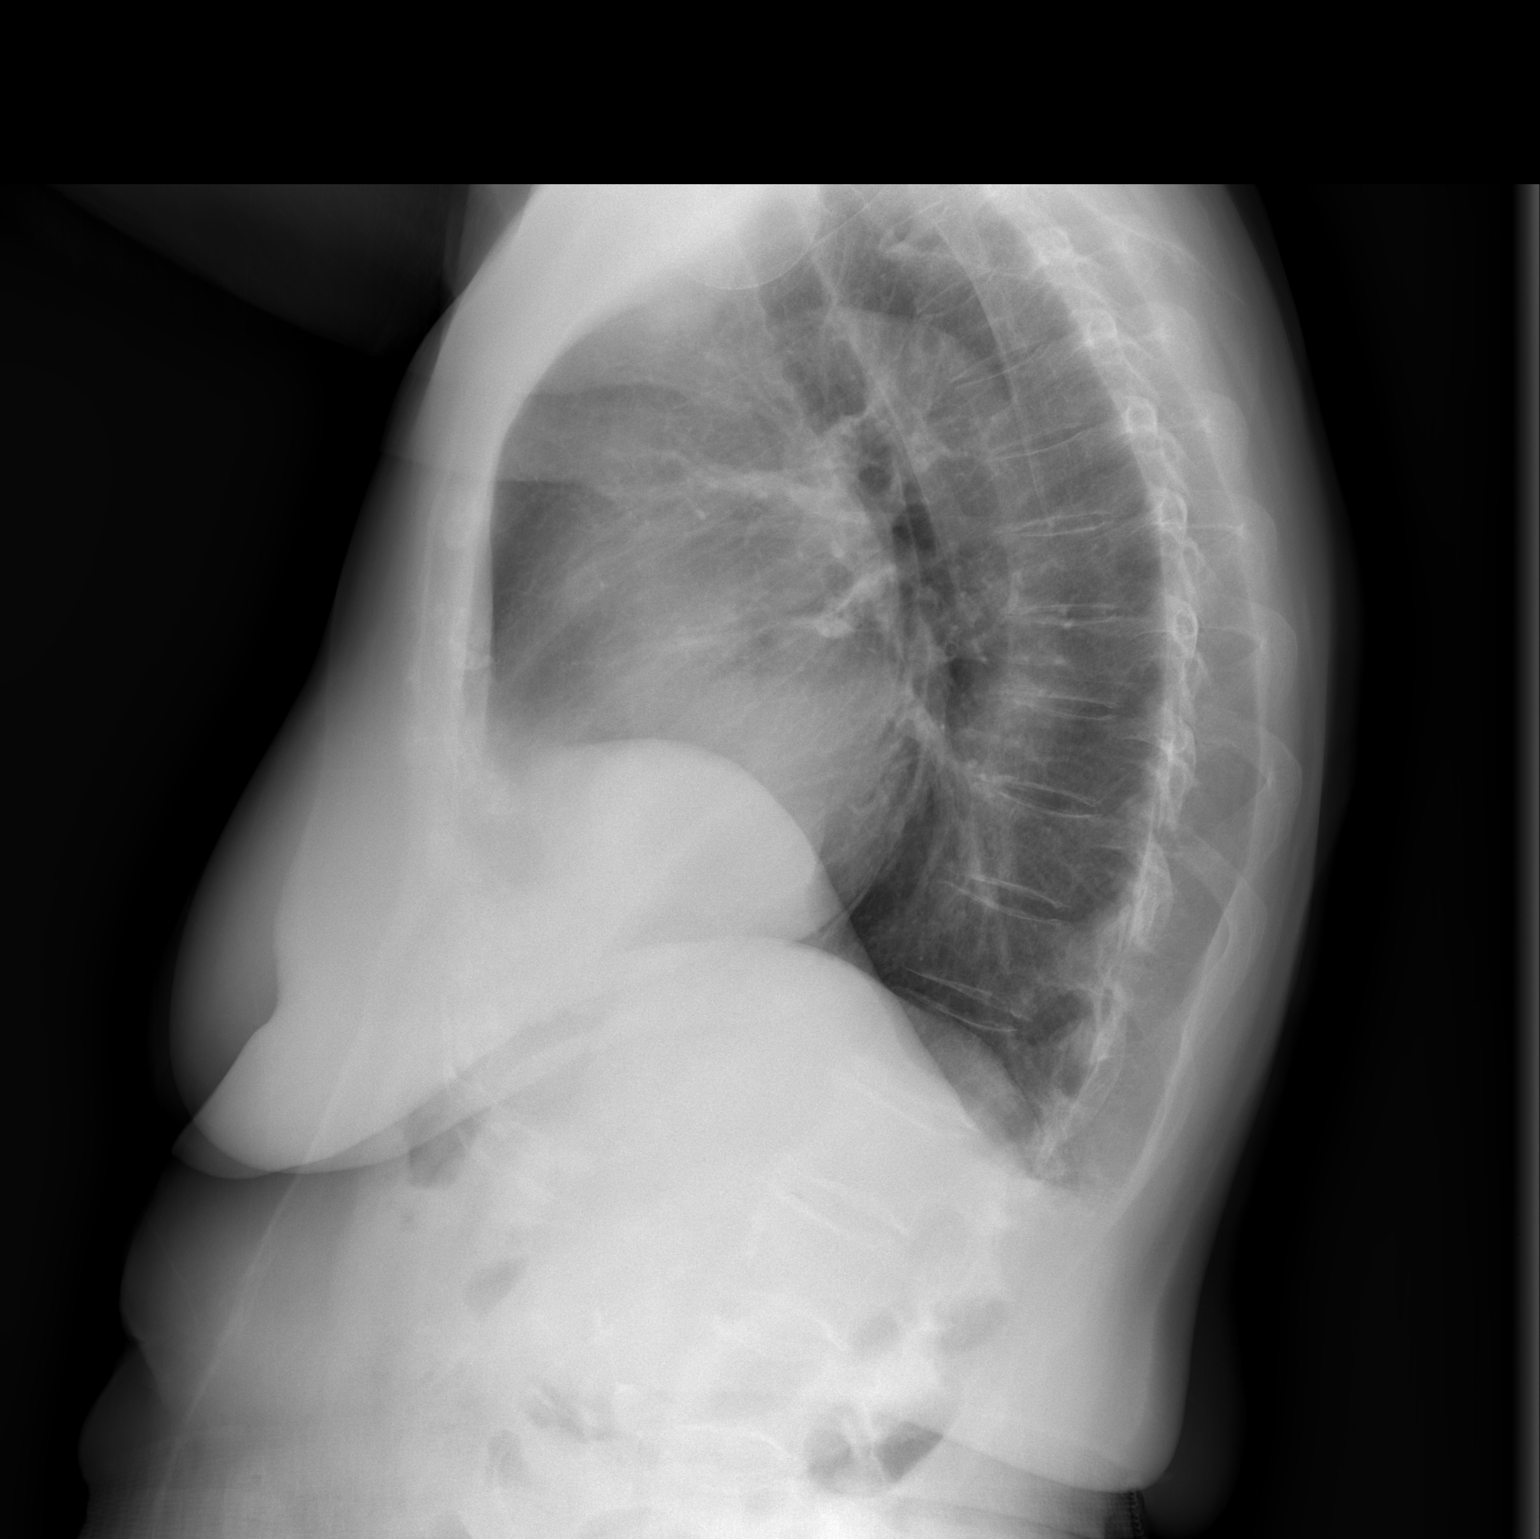

[2 of 2 positions shown; findings below may reference images not displayed]

FINDINGS: Heart and mediastinal contours are within normal limits. No focal
opacities or effusions. No acute bony abnormality.
IMPRESSION: No active cardiopulmonary disease.

## 2017-08-22 DIAGNOSIS — H353211 Exudative age-related macular degeneration, right eye, with active choroidal neovascularization: Secondary | ICD-10-CM | POA: Diagnosis not present

## 2017-08-22 DIAGNOSIS — H353222 Exudative age-related macular degeneration, left eye, with inactive choroidal neovascularization: Secondary | ICD-10-CM | POA: Diagnosis not present

## 2017-08-22 DIAGNOSIS — H43813 Vitreous degeneration, bilateral: Secondary | ICD-10-CM | POA: Diagnosis not present

## 2017-10-04 ENCOUNTER — Encounter: Payer: Self-pay | Admitting: *Deleted

## 2017-10-04 ENCOUNTER — Ambulatory Visit (INDEPENDENT_AMBULATORY_CARE_PROVIDER_SITE_OTHER): Payer: Medicare Other | Admitting: *Deleted

## 2017-10-04 VITALS — BP 134/78 | HR 66 | Ht 61.0 in | Wt 125.0 lb

## 2017-10-04 DIAGNOSIS — Z0000 Encounter for general adult medical examination without abnormal findings: Secondary | ICD-10-CM

## 2017-10-04 NOTE — Patient Instructions (Signed)
  Ms. Katie Huerta , Thank you for taking time to come for your Medicare Wellness Visit. I appreciate your ongoing commitment to your health goals. Please review the following plan we discussed and let me know if I can assist you in the future.   These are the goals we discussed: Goals    . Exercise 3x per week (30 min per time)     Try to walk for 30 minutes at least 3 times per week.       This is a list of the screening recommended for you and due dates:  Health Maintenance  Topic Date Due  . Tetanus Vaccine  03/16/1946  . Pneumonia vaccines (1 of 2 - PCV13) 03/16/1994  . Flu Shot  09/28/2017  . DEXA scan (bone density measurement)  Completed

## 2017-10-05 NOTE — Progress Notes (Signed)
Subjective:   Katie Huerta is a 84 y.o. female who presents for a Medicare Annual Wellness Visit. Charlise lives at home with her husband, [name], who accompanied her today. He provided most of the history because [name] has a significant hearing deficit and significant memory deficit.They have two adult sons. One lives in [place] and there other in [place]. [name] says that he talks with them regularly. [name] enjoys riding in the truck but she doesn't get out and go in to stores very often anymore. She generally likes to stay close to by [name] and she hasn't had any trouble with wandering off from the house. She does very little physical activity and doesn't really do any housework or cooking anymore. [name] prepares most home cooked meals and they eat out as well. He is concerned about her food intake though. She mainly eats snacks like crackers or powdered donuts and she doesn't eat very much of anything.     Review of Systems    Patient reports that her overall health is worse compared to last year.  Cardiac Risk Factors include: sedentary lifestyle;advanced age (>98men, >15 women)  Neuro: Memory has continued to decline  All other systems negative       Current Medications (verified) Outpatient Encounter Medications as of 10/04/2017  Medication Sig  . aspirin EC 81 MG tablet Take 81 mg by mouth daily.  Marland Kitchen latanoprost (XALATAN) 0.005 % ophthalmic solution Place 1 drop into both eyes at bedtime.   . Multiple Vitamins-Minerals (ICAPS PO) Take 1 capsule by mouth 2 (two) times daily.  Marland Kitchen tobramycin (TOBREX) 0.3 % ophthalmic solution Place into the right eye as directed.   . traMADol (ULTRAM) 50 MG tablet Take 1 tablet (50 mg total) by mouth every 6 (six) hours as needed for moderate pain or severe pain. Post-operatively  . vitamin B-12 (CYANOCOBALAMIN) 500 MCG tablet Take 500 mcg by mouth every morning.    No facility-administered encounter medications on file as of  10/04/2017.     Allergies (verified) Penicillins   History: Past Medical History:  Diagnosis Date  . Bradycardia   . CKD (chronic kidney disease), stage III ([place])   . Dementia   . Dysuria   . Hematuria   . 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 History of palpitations   . History of sepsis    07-13-2015  urosepsis  . History of syncope    2013--  per documentation in epic by cardiologist-- dr hochrein -  negative work-up, felt to be vagal episode  . HOH (hard of hearing)    pt want wear wearing aids  . Left renal mass    small solid noted on ct 05/ 2017  . Macular degeneration of both eyes    treatment w/ drops and injection's   Past Surgical History:  Procedure Laterality Date  . APPENDECTOMY  as teen  . CATARACT EXTRACTION W/ INTRAOCULAR LENS  IMPLANT, BILATERAL  2005  . CYSTOSCOPY W/ URETERAL STENT PLACEMENT Right 07/01/2015   Procedure: CYSTOSCOPY WITH RETROGRADE PYELOGRAM/URETERAL STENT PLACEMENT;  Surgeon: Alexis Frock, MD;  Location: [facility];  Service: Urology;  Laterality: Right;  . CYSTOSCOPY WITH RETROGRADE PYELOGRAM, URETEROSCOPY AND STENT PLACEMENT Bilateral 09/25/2015   Procedure: CYSTOSCOPY WITH BILATERAL RETROGRADE PYELOGRAM, RIGHT URETEROSCOPY WITH BIOPSY AND RIGHT  STENT EXCHANGE;  Surgeon: Alexis Frock, MD;  Location: [facility];  Service: Urology;  Laterality: Bilateral;  . CYSTOSCOPY/RETROGRADE/URETEROSCOPY Bilateral 06/21/2017   Procedure: CYSTOSCOPY WITH BILATERAL RETROGRADE PYELOGRAMS, WITH DIAGNOSTIC RIGHT URETEROSCOPY;  Surgeon: Alexis Frock, MD;  Location: [facility];  Service: Urology;  Laterality: Bilateral;  . HOLMIUM LASER APPLICATION Right 1/77/9390   Procedure: LASER ABLATION OF URETERAL MASS;  Surgeon: Alexis Frock, MD;  Location: [facility];  Service: Urology;   Laterality: Right;  . REFRACTIVE SURGERY Bilateral left 2009/  right 2011  . ROBOT ASSITED LAPAROSCOPIC NEPHROURETERECTOMY Right 11/18/2015   Procedure: XI ROBOT ASSITED LAPAROSCOPIC NEPHROURETERECTOMY  laparoscopic adhesiolysis;  Surgeon: Alexis Frock, MD;  Location: [facility];  Service: Urology;  Laterality: Right;  . TRANSTHORACIC ECHOCARDIOGRAM  07-04-2015   severe LVH,  ef 60-65%,  grade 1 diastolic dysfunction/  trivial AR and MR/  mild LAE/  mild TR/  mild increase PASP 94mmHg  . TRANSURETHRAL RESECTION OF BLADDER TUMOR N/A 06/21/2017   Procedure: TRANSURETHRAL RESECTION OF BLADDER TUMOR (TURBT);  Surgeon: Alexis Frock, MD;  Location: [facility];  Service: Urology;  Laterality: N/A;  . VAGINAL HYSTERECTOMY  yrs ago   w/  Bilateral Salpingoophorectomy   Family History  Problem Relation Age of Onset  . Coronary artery disease Father 43       Died 61s of an MI  . Heart attack Father   . Coronary artery disease Brother 31  . Heart attack Brother   . Coronary artery disease Sister 37  . Heart attack Brother   . Heart attack Brother    Social History   Socioeconomic History  . Marital status: Married    Spouse name: Not on file  . Number of children: 2  . Years of education: Not on file  . Highest education level: Not on file  Occupational History  . Occupation: retired  Scientific laboratory technician  . Financial resource strain: Not hard at all  . Food insecurity:    Worry: Never true    Inability: Never true  . Transportation needs:    Medical: No    Non-medical: No  Tobacco Use  . Smoking status: Former Smoker    Years: 10.00    Types: Cigarettes    Last attempt to quit: 10/27/1955    Years since quitting: 61.9  . Smokeless tobacco: Never Used  Substance and Sexual Activity  . Alcohol use: No  . Drug use: No  . Sexual activity: Yes  Lifestyle  . Physical activity:    Days per week: 0 days    Minutes per session: 0 min  . Stress: To some extent  Relationships    . Social connections:    Talks on phone: More than three times a week    Gets together: More than three times a week    Attends religious service: More than 4 times per year    Active member of club or organization: No    Attends meetings of clubs or organizations: Never    Relationship status: Married  Other Topics Concern  . Not on file  Social History Narrative   Lives at home with husband.   She works at a [employer].    Tobacco Use No.  Clinical Intake:      How often do you need to have someone help you when you read instructions, pamphlets, or other written materials from your doctor or pharmacy?: 4 - Often What is the last grade level you completed in school?: 12  Activities of Daily Living In your present state of health, do you have any difficulty performing the following activities: 10/04/2017 06/21/2017  Hearing? Tempie Donning  Vision? N N  Difficulty concentrating or making decisions? Y Y  Comment has dementia -  Walking or climbing stairs? N Y  Dressing or bathing? N Y  Doing errands, shopping? Y -  Conservation officer, nature and eating ? N -  Using the Toilet? N -  In the past six months, have you accidently leaked urine? Y -  Do you have problems with loss of bowel control? N -  Managing your Medications? Y -  Managing your Finances? Y -  Housekeeping or managing your Housekeeping? Y -  Some recent data might be hidden     Diet Eats very little according to her husband.  Breakfast-1 or 2 small powdered donuts. Lunch-They have lunch at the rec dept on Wednesdays and someone brings food on Tuesday evenings. She doesn't like leftovers and she picks over what she has at the Rec.  Supper-Picks at supper or eats something like a nutty buddy  Exercise Current Exercise Habits: The patient does not participate in regular exercise at present, Exercise limited by: None identified   Depression Screen PHQ 2/9 Scores 10/04/2017 11/09/2016 09/29/2016 04/06/2016  12/07/2015 07/13/2015 05/21/2015  PHQ - 2 Score 0 0 0 - 0 0 0  PHQ- 9 Score - - - - - - -  Not completed - - - Has dementia. - - -     Fall Risk Fall Risk  10/04/2017 11/09/2016 09/29/2016 12/07/2015 07/13/2015  Falls in the past year? No No No No No  Number falls in past yr: - - - - -  Injury with Fall? - - - - -  Risk for fall due to : - - - - -    Safety Is the patient's home free of loose throw rugs in walkways, pet beds, electrical cords, etc?   yes      Grab bars in the bathroom? no      Handrails on the stairs?   yes      Adequate lighting?   yes  Patient Care Team: [name], MD as PCP - General (Pediatrics) Sherlynn Stalls, MD as Consulting Physician (Ophthalmology) Alexis Frock, MD as Consulting Physician (Urology)  Hospitalizations, surgeries, and ER visits in previous 12 months No hospitalizations, ER visits, or surgeries this past year.   Objective:     Patient is a pleasant 84 year old female with obvious memory and hearing impairment. She was cooperative with me but frustrated with her husband at times for helping her answer questions.   Today's Vitals   10/04/17 1057  BP: 134/78  Pulse: 66  Weight: 125 lb (56.7 kg)  Height: 5\' 1"  (1.549 m)   Body mass index is 23.62 kg/m.  Advanced Directives 10/04/2017 06/21/2017 09/29/2016 11/18/2015 11/10/2015 09/25/2015 07/01/2015  Does Patient Have a Medical Advance Directive? Yes Yes Yes Yes Yes Yes No  Type of Paramedic of [place];Living will [place];Living will [place];Living will [place];Living will Living will;Healthcare Power of [place];Living will -  Does patient want to make changes to medical advance directive? - - No - Patient declined - - - -  Copy of [place] in Chart? No - copy requested - Yes Yes No - copy requested No - copy requested -  Would patient like information on  creating a medical advance directive? - - - - - - No - patient declined information    Hearing/Vision  No vision deficits noted during visit but significant hearing deficit   Cognitive Function: MMSE - Mini Mental State Exam 09/29/2016 03/25/2015  Orientation to time 0 2  Orientation to time comments Not oriented to time at all -  Orientation to Place 5 5  Registration 3 3  Attention/ Calculation 5 5  Recall 0 0  Recall-comments Didn't recall being asked to remember 3 words -  Language- name 2 objects 2 2  Language- repeat 1 1  Language- follow 3 step command 3 3  Language- read & follow direction 1 1  Write a sentence 1 1  Copy design 1 1  Total score 22 24  Did not attempt today. Patient has known dementia that is worsening. This was obvious during the visit and supported by her husband     Normal Cognitive Function Screening: No:     Immunizations and Health Maintenance Immunization History  Administered Date(s) Administered  . Influenza, High Dose Seasonal PF 12/14/2016  . Influenza,inj,Quad PF,6+ Mos 12/11/2014, 11/20/2015   Health Maintenance Due  Topic Date Due  . TETANUS/TDAP  03/16/1946  . PNA vac Low Risk Adult (1 of 2 - PCV13) 03/16/1994  . INFLUENZA VACCINE  09/28/2017   Health Maintenance  Topic Date Due  . TETANUS/TDAP  03/16/1946  . PNA vac Low Risk Adult (1 of 2 - PCV13) 03/16/1994  . INFLUENZA VACCINE  09/28/2017  . DEXA SCAN  Completed        Assessment:   This is a routine wellness examination for [place].    Plan:    Goals    . Exercise 3x per week (30 min per time)     Try to walk for 30 minutes at least 3 times per week.    . Have 3 meals a day     Try to eat more lean proteins, fruits, and vegetables and eat 3 meals per day.        Health Maintenance Recommendations: Tdap-declined but recommended plain TD if injured Pneumonia-Declined today. States that she has had pneumonia vaccines  Additional Screening  Recommendations: Lung: Low Dose CT Chest recommended if Age 37-80 years, 30 pack-year currently smoking OR have quit w/in 15years. Patient does not qualify. Hepatitis C Screening recommended: no  Today's Orders No orders of the defined types were placed in this encounter.   CPE scheduled with Dr Evette Doffing. F/u sooner if necessary Continue current medications Move carefully to avoid falls. Use assistive devices like a can or walker if needed. Aim for at least 150 minutes of moderate activity a week. This can be done with chair exercises if necessary. Read or work on puzzles daily Stay connected with friends and family Increase intake of lean protein, fruits, and vegetables. Aim for 3 meals a day.   I have personally reviewed and noted the following in the patient's chart:   . Medical and social history . Use of alcohol, tobacco or illicit drugs  . Current medications and supplements . Functional ability and status . Nutritional status . Physical activity . Advanced directives . List of other physicians . Hospitalizations, surgeries, and ER visits in previous 12 months . Vitals . Screenings to include cognitive, depression, and falls . Referrals and appointments  In addition, I have reviewed and discussed with patient certain preventive protocols, quality metrics, and best practice recommendations. A written personalized care plan for  preventive services as well as general preventive health recommendations were provided to patient.     Chong Sicilian, RN   10/05/2017

## 2017-10-16 ENCOUNTER — Other Ambulatory Visit: Payer: Self-pay | Admitting: Pediatrics

## 2017-10-16 ENCOUNTER — Other Ambulatory Visit: Payer: Medicare Other

## 2017-10-16 ENCOUNTER — Telehealth: Payer: Self-pay | Admitting: Pediatrics

## 2017-10-16 DIAGNOSIS — R350 Frequency of micturition: Secondary | ICD-10-CM | POA: Diagnosis not present

## 2017-10-16 DIAGNOSIS — N3090 Cystitis, unspecified without hematuria: Secondary | ICD-10-CM

## 2017-10-16 LAB — URINALYSIS, COMPLETE
Bilirubin, UA: NEGATIVE
Glucose, UA: NEGATIVE
Nitrite, UA: NEGATIVE
PH UA: 6 (ref 5.0–7.5)
Specific Gravity, UA: 1.025 (ref 1.005–1.030)
UUROB: 1 mg/dL (ref 0.2–1.0)

## 2017-10-16 LAB — MICROSCOPIC EXAMINATION: RBC, UA: >30 /hpf — AB (ref 0–2)

## 2017-10-16 MED ORDER — SULFAMETHOXAZOLE-TRIMETHOPRIM 800-160 MG PO TABS: 1.0000 | ORAL_TABLET | Freq: Two times a day (BID) | ORAL | 0 refills | Status: DC

## 2017-10-16 NOTE — Telephone Encounter (Signed)
That's fine as long as she is feelin gok, her normal self, no fevers or abd pain. She should be seen if she is feeling unwell. I can see her at 1115 or 215 today if needed. Can you order UA and urine culture?

## 2017-10-16 NOTE — Telephone Encounter (Signed)
Husband aware and verbalizes understanding. Orders placed. Denies fever or pain- just frequent urination.

## 2017-10-18 ENCOUNTER — Encounter: Payer: Self-pay | Admitting: Pediatrics

## 2017-10-18 ENCOUNTER — Ambulatory Visit (INDEPENDENT_AMBULATORY_CARE_PROVIDER_SITE_OTHER): Payer: Medicare Other | Admitting: Pediatrics

## 2017-10-18 VITALS — BP 106/71 | HR 73 | Temp 97.6°F | Ht 61.0 in | Wt 123.0 lb

## 2017-10-18 DIAGNOSIS — Z0000 Encounter for general adult medical examination without abnormal findings: Secondary | ICD-10-CM

## 2017-10-18 DIAGNOSIS — E538 Deficiency of other specified B group vitamins: Secondary | ICD-10-CM | POA: Diagnosis not present

## 2017-10-18 LAB — URINE CULTURE

## 2017-10-18 NOTE — Progress Notes (Signed)
  Subjective:   Patient ID: Katie Huerta, female    DOB: 1927/08/10, 84 y.o.   MRN: [id number] CC: Annual Exam  HPI: [name] is a 84 y.o. female   Here today with her husband.  Overall she is been feeling.  For the last couple weeks she has had increased urinary frequency.  She brought in a urine sample 2 days ago positive for leukocyte esterase, blood.  Treated with Bactrim.  No fevers.  Her appetite is been okay, no change since being sick. Husband wishes that she would eat more nutritious foods then junk food such as donuts and anybody cookies she enjoys.  Ureteral cancer: Following with urology every 6 months.  She had a cystoscopy last 4 months ago.  Dementia: Husband does most of the housework, their activities of daily living.  There is family in the area who helps as well.  He says her memory is not good enough to be very helpful.  They are hoping to start walking more regularly.  Now she does walk to the mailbox regularly.  No shortness of breath or chest pain with exertion.  She is taking vitamin B12 daily.  Relevant past medical, surgical, family and social history reviewed. Allergies and medications reviewed and updated. Social History   Tobacco Use  Smoking Status Former Smoker  . Years: 10.00  . Types: Cigarettes  . Last attempt to quit: 10/27/1955  . Years since quitting: 62.0  Smokeless Tobacco Never Used   ROS: All systems negative other than what is in the HPI  Objective:    BP 106/71   Pulse 73   Temp 97.6 F (36.4 C) (Oral)   Ht _0  (1.549 m)   Wt 123 lb (55.8 kg)   BMI 23.24 kg/m   Wt Readings from Last 3 Encounters:  10/18/17 123 lb (55.8 kg)  10/04/17 125 lb (56.7 kg)  06/21/17 129 lb (58.5 kg)    Gen: NAD, alert, cooperative with exam, NCAT EYES: EOMI, no conjunctival injection, or no icterus ENT:  TMs pearly gray b/l, OP without erythema LYMPH: no cervical LAD CV: NRRR, normal S1/S2, no murmur, distal pulses 2+ b/l Resp:  CTABL, no wheezes, normal WOB Abd: +BS, soft, mildly tender to palpation left lower quadrant. ND. Ext: No edema, warm Neuro: Alert and oriented, strength equal b/l UE and LE, coordination grossly normal MSK: normal muscle bulk Skin: Scab on nose bridge.  Assessment & Plan:  Verline was seen today for annual exam.  Diagnoses and all orders for this visit:  Encounter for preventive health examination Continue to encourage gentle walking regularly. Okay to stop aspirin, takes it for primary prevention.  No history of stroke or heart attack. -     CBC with Differential/Platelet -     CMP14+EGFR  Vitamin B12 deficiency -     Vitamin B12   Follow up plan: Return in about 2 weeks (around 11/01/2017).  Repeat UA for resolution of hematuria. Recheck scab on nose bridge Assunta Found, MD [place]

## 2017-10-19 ENCOUNTER — Telehealth: Payer: Self-pay | Admitting: Pediatrics

## 2017-10-19 LAB — CMP14+EGFR
A/G RATIO: 1.3 (ref 1.2–2.2)
ALT: 9 IU/L (ref 0–32)
AST: 12 IU/L (ref 0–40)
Albumin: 3.9 g/dL (ref 3.5–4.7)
Alkaline Phosphatase: 140 IU/L — ABNORMAL HIGH (ref 39–117)
BUN/Creatinine Ratio: 13 (ref 12–28)
BUN: 18 mg/dL (ref 8–27)
Bilirubin Total: 0.3 mg/dL (ref 0.0–1.2)
CALCIUM: 8.9 mg/dL (ref 8.7–10.3)
CO2: 21 mmol/L (ref 20–29)
Chloride: 100 mmol/L (ref 96–106)
Creatinine, Ser: 1.38 mg/dL — ABNORMAL HIGH (ref 0.57–1.00)
GFR, EST AFRICAN AMERICAN: 39 mL/min/{1.73_m2} — AB (ref >59)
GFR, EST NON AFRICAN AMERICAN: 34 mL/min/{1.73_m2} — AB (ref >59)
GLOBULIN, TOTAL: 3 g/dL (ref 1.5–4.5)
Glucose: 102 mg/dL — ABNORMAL HIGH (ref 65–99)
POTASSIUM: 3.9 mmol/L (ref 3.5–5.2)
SODIUM: 138 mmol/L (ref 134–144)
Total Protein: 6.9 g/dL (ref 6.0–8.5)

## 2017-10-19 LAB — CBC WITH DIFFERENTIAL/PLATELET
BASOS: 1 %
Basophils Absolute: 0.1 10*3/uL (ref 0.0–0.2)
EOS (ABSOLUTE): 0.1 10*3/uL (ref 0.0–0.4)
Eos: 1 %
HEMATOCRIT: 35.8 % (ref 34.0–46.6)
Hemoglobin: 11.2 g/dL (ref 11.1–15.9)
IMMATURE GRANULOCYTES: 1 %
Immature Grans (Abs): 0.1 10*3/uL (ref 0.0–0.1)
Lymphocytes Absolute: 1 10*3/uL (ref 0.7–3.1)
Lymphs: 8 %
MCH: 26.4 pg — ABNORMAL LOW (ref 26.6–33.0)
MCHC: 31.3 g/dL — ABNORMAL LOW (ref 31.5–35.7)
MCV: 84 fL (ref 79–97)
Monocytes Absolute: 0.7 10*3/uL (ref 0.1–0.9)
Monocytes: 6 %
NEUTROS PCT: 83 %
Neutrophils Absolute: 10.8 10*3/uL — ABNORMAL HIGH (ref 1.4–7.0)
Platelets: 342 10*3/uL (ref 150–450)
RBC: 4.25 x10E6/uL (ref 3.77–5.28)
RDW: 14.4 % (ref 12.3–15.4)
WBC: 12.8 10*3/uL — AB (ref 3.4–10.8)

## 2017-10-19 LAB — VITAMIN B12: Vitamin B-12: >2000 pg/mL — ABNORMAL HIGH (ref 232–1245)

## 2017-10-19 MED ORDER — CIPROFLOXACIN HCL 250 MG PO TABS: 250.0000 mg | ORAL_TABLET | Freq: Two times a day (BID) | ORAL | 0 refills | Status: AC

## 2017-10-19 NOTE — Telephone Encounter (Signed)
Aware of medication change and recommendations

## 2017-10-19 NOTE — Telephone Encounter (Signed)
Stop bactrim. I sent in ciprofloxacin, take twice a day for three more days. Any worsening should be seen.

## 2017-10-23 DIAGNOSIS — K8020 Calculus of gallbladder without cholecystitis without obstruction: Secondary | ICD-10-CM | POA: Diagnosis not present

## 2017-10-23 DIAGNOSIS — R3121 Asymptomatic microscopic hematuria: Secondary | ICD-10-CM | POA: Diagnosis not present

## 2017-10-23 DIAGNOSIS — R8271 Bacteriuria: Secondary | ICD-10-CM | POA: Diagnosis not present

## 2017-10-23 DIAGNOSIS — C678 Malignant neoplasm of overlapping sites of bladder: Secondary | ICD-10-CM | POA: Diagnosis not present

## 2017-10-23 DIAGNOSIS — N3000 Acute cystitis without hematuria: Secondary | ICD-10-CM | POA: Diagnosis not present

## 2017-10-31 ENCOUNTER — Telehealth: Payer: Self-pay | Admitting: Pediatrics

## 2017-10-31 NOTE — Telephone Encounter (Signed)
FYI Pt spouse wanted dr Evette Doffing to know that the reason that [name] canceled her apt for 9/4 is because the spouse and son thinks it is best for her to follow up with her urologist due to her having bag attached now as well as what he coming out is no longer red as blood but black as coffee, she will be seeing urologist on 9/12

## 2017-11-01 ENCOUNTER — Ambulatory Visit: Payer: Medicare Other | Admitting: Pediatrics

## 2017-11-03 ENCOUNTER — Ambulatory Visit: Payer: Medicare Other | Admitting: Physician Assistant

## 2017-11-03 DIAGNOSIS — R310 Gross hematuria: Secondary | ICD-10-CM | POA: Diagnosis not present

## 2017-11-03 DIAGNOSIS — C678 Malignant neoplasm of overlapping sites of bladder: Secondary | ICD-10-CM | POA: Diagnosis not present

## 2017-11-04 ENCOUNTER — Encounter (HOSPITAL_COMMUNITY): Payer: Self-pay | Admitting: Emergency Medicine

## 2017-11-04 ENCOUNTER — Other Ambulatory Visit: Payer: Self-pay

## 2017-11-04 ENCOUNTER — Inpatient Hospital Stay (HOSPITAL_COMMUNITY)
Admission: EM | Admit: 2017-11-04 | Discharge: 2017-11-11 | DRG: 669 | Disposition: A | Discharge status: Hospice | Payer: Medicare Other | Attending: Internal Medicine | Admitting: Internal Medicine

## 2017-11-04 DIAGNOSIS — Z9841 Cataract extraction status, right eye: Secondary | ICD-10-CM | POA: Diagnosis not present

## 2017-11-04 DIAGNOSIS — Z87448 Personal history of other diseases of urinary system: Secondary | ICD-10-CM | POA: Diagnosis present

## 2017-11-04 DIAGNOSIS — C689 Malignant neoplasm of urinary organ, unspecified: Secondary | ICD-10-CM | POA: Diagnosis not present

## 2017-11-04 DIAGNOSIS — R310 Gross hematuria: Secondary | ICD-10-CM | POA: Diagnosis not present

## 2017-11-04 DIAGNOSIS — C679 Malignant neoplasm of bladder, unspecified: Secondary | ICD-10-CM | POA: Diagnosis not present

## 2017-11-04 DIAGNOSIS — Z79899 Other long term (current) drug therapy: Secondary | ICD-10-CM | POA: Diagnosis not present

## 2017-11-04 DIAGNOSIS — R55 Syncope and collapse: Secondary | ICD-10-CM | POA: Diagnosis not present

## 2017-11-04 DIAGNOSIS — R319 Hematuria, unspecified: Secondary | ICD-10-CM | POA: Diagnosis present

## 2017-11-04 DIAGNOSIS — Z87898 Personal history of other specified conditions: Secondary | ICD-10-CM | POA: Diagnosis present

## 2017-11-04 DIAGNOSIS — E538 Deficiency of other specified B group vitamins: Secondary | ICD-10-CM | POA: Diagnosis not present

## 2017-11-04 DIAGNOSIS — C661 Malignant neoplasm of right ureter: Secondary | ICD-10-CM | POA: Diagnosis not present

## 2017-11-04 DIAGNOSIS — L8990 Pressure ulcer of unspecified site, unspecified stage: Secondary | ICD-10-CM

## 2017-11-04 DIAGNOSIS — Z7982 Long term (current) use of aspirin: Secondary | ICD-10-CM | POA: Diagnosis not present

## 2017-11-04 DIAGNOSIS — Z9842 Cataract extraction status, left eye: Secondary | ICD-10-CM

## 2017-11-04 DIAGNOSIS — N2889 Other specified disorders of kidney and ureter: Secondary | ICD-10-CM

## 2017-11-04 DIAGNOSIS — H3530 Unspecified macular degeneration: Secondary | ICD-10-CM | POA: Diagnosis present

## 2017-11-04 DIAGNOSIS — N289 Disorder of kidney and ureter, unspecified: Secondary | ICD-10-CM | POA: Diagnosis not present

## 2017-11-04 DIAGNOSIS — D62 Acute posthemorrhagic anemia: Secondary | ICD-10-CM

## 2017-11-04 DIAGNOSIS — F0390 Unspecified dementia without behavioral disturbance: Secondary | ICD-10-CM | POA: Diagnosis present

## 2017-11-04 DIAGNOSIS — Z8554 Personal history of malignant neoplasm of ureter: Secondary | ICD-10-CM | POA: Diagnosis not present

## 2017-11-04 DIAGNOSIS — Z87891 Personal history of nicotine dependence: Secondary | ICD-10-CM | POA: Diagnosis not present

## 2017-11-04 DIAGNOSIS — Z66 Do not resuscitate: Secondary | ICD-10-CM | POA: Diagnosis not present

## 2017-11-04 DIAGNOSIS — Z7189 Other specified counseling: Secondary | ICD-10-CM | POA: Diagnosis not present

## 2017-11-04 DIAGNOSIS — N183 Chronic kidney disease, stage 3 unspecified: Secondary | ICD-10-CM | POA: Diagnosis present

## 2017-11-04 DIAGNOSIS — Z961 Presence of intraocular lens: Secondary | ICD-10-CM | POA: Diagnosis not present

## 2017-11-04 DIAGNOSIS — Z880 Allergy status to penicillin: Secondary | ICD-10-CM | POA: Diagnosis not present

## 2017-11-04 DIAGNOSIS — R339 Retention of urine, unspecified: Secondary | ICD-10-CM | POA: Diagnosis not present

## 2017-11-04 DIAGNOSIS — Z515 Encounter for palliative care: Secondary | ICD-10-CM | POA: Diagnosis not present

## 2017-11-04 DIAGNOSIS — R0902 Hypoxemia: Secondary | ICD-10-CM | POA: Diagnosis not present

## 2017-11-04 DIAGNOSIS — R531 Weakness: Secondary | ICD-10-CM | POA: Diagnosis not present

## 2017-11-04 DIAGNOSIS — H9190 Unspecified hearing loss, unspecified ear: Secondary | ICD-10-CM | POA: Diagnosis not present

## 2017-11-04 DIAGNOSIS — Z905 Acquired absence of kidney: Secondary | ICD-10-CM

## 2017-11-04 DIAGNOSIS — Z90710 Acquired absence of both cervix and uterus: Secondary | ICD-10-CM

## 2017-11-04 DIAGNOSIS — I959 Hypotension, unspecified: Secondary | ICD-10-CM | POA: Diagnosis not present

## 2017-11-04 DIAGNOSIS — Z8249 Family history of ischemic heart disease and other diseases of the circulatory system: Secondary | ICD-10-CM | POA: Diagnosis not present

## 2017-11-04 DIAGNOSIS — N390 Urinary tract infection, site not specified: Secondary | ICD-10-CM | POA: Diagnosis not present

## 2017-11-04 DIAGNOSIS — W19XXXA Unspecified fall, initial encounter: Secondary | ICD-10-CM | POA: Diagnosis not present

## 2017-11-04 DIAGNOSIS — Z90722 Acquired absence of ovaries, bilateral: Secondary | ICD-10-CM

## 2017-11-04 LAB — CBC
HCT: 25.2 % — ABNORMAL LOW (ref 36.0–46.0)
HEMOGLOBIN: 8 g/dL — AB (ref 12.0–15.0)
MCH: 26.8 pg (ref 26.0–34.0)
MCHC: 31.7 g/dL (ref 30.0–36.0)
MCV: 84.6 fL (ref 78.0–100.0)
PLATELETS: 318 10*3/uL (ref 150–400)
RBC: 2.98 MIL/uL — AB (ref 3.87–5.11)
RDW: 15.8 % — ABNORMAL HIGH (ref 11.5–15.5)
WBC: 14.4 10*3/uL — AB (ref 4.0–10.5)

## 2017-11-04 LAB — BASIC METABOLIC PANEL
ANION GAP: 8 (ref 5–15)
BUN: 21 mg/dL (ref 8–23)
CALCIUM: 7.9 mg/dL — AB (ref 8.9–10.3)
CO2: 23 mmol/L (ref 22–32)
CREATININE: 1.23 mg/dL — AB (ref 0.44–1.00)
Chloride: 107 mmol/L (ref 98–111)
GFR, EST AFRICAN AMERICAN: 44 mL/min — AB (ref >60)
GFR, EST NON AFRICAN AMERICAN: 38 mL/min — AB (ref >60)
Glucose, Bld: 130 mg/dL — ABNORMAL HIGH (ref 70–99)
Potassium: 3.7 mmol/L (ref 3.5–5.1)
Sodium: 138 mmol/L (ref 135–145)

## 2017-11-04 LAB — CBG MONITORING, ED: GLUCOSE-CAPILLARY: 123 mg/dL — AB (ref 70–99)

## 2017-11-04 NOTE — ED Triage Notes (Signed)
Picked up from home Complaint of weakness for 1 week N/V at home Pt has hx of bladder Ca and is on cipro Has foley in place

## 2017-11-05 ENCOUNTER — Encounter (HOSPITAL_COMMUNITY): Payer: Self-pay | Admitting: Family Medicine

## 2017-11-05 DIAGNOSIS — N390 Urinary tract infection, site not specified: Secondary | ICD-10-CM | POA: Diagnosis present

## 2017-11-05 DIAGNOSIS — R310 Gross hematuria: Secondary | ICD-10-CM

## 2017-11-05 DIAGNOSIS — N183 Chronic kidney disease, stage 3 (moderate): Secondary | ICD-10-CM

## 2017-11-05 DIAGNOSIS — Z87448 Personal history of other diseases of urinary system: Secondary | ICD-10-CM | POA: Diagnosis present

## 2017-11-05 DIAGNOSIS — Z87898 Personal history of other specified conditions: Secondary | ICD-10-CM | POA: Diagnosis present

## 2017-11-05 DIAGNOSIS — D62 Acute posthemorrhagic anemia: Secondary | ICD-10-CM

## 2017-11-05 DIAGNOSIS — N289 Disorder of kidney and ureter, unspecified: Secondary | ICD-10-CM

## 2017-11-05 LAB — BASIC METABOLIC PANEL
Anion gap: 11 (ref 5–15)
BUN: 20 mg/dL (ref 8–23)
CHLORIDE: 108 mmol/L (ref 98–111)
CO2: 22 mmol/L (ref 22–32)
Calcium: 8 mg/dL — ABNORMAL LOW (ref 8.9–10.3)
Creatinine, Ser: 1.26 mg/dL — ABNORMAL HIGH (ref 0.44–1.00)
GFR, EST AFRICAN AMERICAN: 43 mL/min — AB (ref >60)
GFR, EST NON AFRICAN AMERICAN: 37 mL/min — AB (ref >60)
Glucose, Bld: 89 mg/dL (ref 70–99)
POTASSIUM: 3.9 mmol/L (ref 3.5–5.1)
SODIUM: 141 mmol/L (ref 135–145)

## 2017-11-05 LAB — URINALYSIS, ROUTINE W REFLEX MICROSCOPIC

## 2017-11-05 LAB — URINALYSIS, MICROSCOPIC (REFLEX)

## 2017-11-05 LAB — TROPONIN I

## 2017-11-05 LAB — ABO/RH: ABO/RH(D): O POS

## 2017-11-05 LAB — GLUCOSE, CAPILLARY: Glucose-Capillary: 86 mg/dL (ref 70–99)

## 2017-11-05 LAB — PREPARE RBC (CROSSMATCH)

## 2017-11-05 MED ORDER — SENNOSIDES-DOCUSATE SODIUM 8.6-50 MG PO TABS: 1.0000 | ORAL_TABLET | Freq: Every evening | ORAL | Status: DC | PRN

## 2017-11-05 MED ORDER — BISACODYL 5 MG PO TBEC
5.0000 mg | DELAYED_RELEASE_TABLET | Freq: Every day | ORAL | Status: DC | PRN
  Filled 2017-11-05: qty 1

## 2017-11-05 MED ORDER — SODIUM CHLORIDE 0.9 % IV SOLN
INTRAVENOUS | Status: AC
  Administered 2017-11-05: 03:00:00 via INTRAVENOUS

## 2017-11-05 MED ORDER — CIPROFLOXACIN HCL 250 MG PO TABS
250.0000 mg | ORAL_TABLET | Freq: Two times a day (BID) | ORAL | Status: DC
  Administered 2017-11-05 – 2017-11-11 (×13): 250 mg via ORAL
  Filled 2017-11-05 (×13): qty 1

## 2017-11-05 MED ORDER — ACETAMINOPHEN 650 MG RE SUPP: 650.0000 mg | Freq: Four times a day (QID) | RECTAL | Status: DC | PRN

## 2017-11-05 MED ORDER — SODIUM CHLORIDE 0.9% IV SOLUTION: Freq: Once | INTRAVENOUS | Status: DC

## 2017-11-05 MED ORDER — ONDANSETRON HCL 4 MG PO TABS: 4.0000 mg | ORAL_TABLET | Freq: Four times a day (QID) | ORAL | Status: DC | PRN

## 2017-11-05 MED ORDER — SODIUM CHLORIDE 0.9% FLUSH
3.0000 mL | Freq: Two times a day (BID) | INTRAVENOUS | Status: DC
  Administered 2017-11-05 – 2017-11-10 (×11): 3 mL via INTRAVENOUS

## 2017-11-05 MED ORDER — HYDROCODONE-ACETAMINOPHEN 5-325 MG PO TABS: 1.0000 | ORAL_TABLET | ORAL | Status: DC | PRN

## 2017-11-05 MED ORDER — ACETAMINOPHEN 325 MG PO TABS: 650.0000 mg | ORAL_TABLET | Freq: Four times a day (QID) | ORAL | Status: DC | PRN

## 2017-11-05 MED ORDER — LATANOPROST 0.005 % OP SOLN
1.0000 [drp] | Freq: Every day | OPHTHALMIC | Status: DC
  Administered 2017-11-05 – 2017-11-10 (×5): 1 [drp] via OPHTHALMIC
  Filled 2017-11-05 (×2): qty 2.5

## 2017-11-05 MED ORDER — VITAMIN B-12 1000 MCG PO TABS
1000.0000 ug | ORAL_TABLET | Freq: Every day | ORAL | Status: DC
  Administered 2017-11-05 – 2017-11-11 (×6): 1000 ug via ORAL
  Filled 2017-11-05 (×6): qty 1

## 2017-11-05 MED ORDER — ONDANSETRON HCL 4 MG/2ML IJ SOLN
4.0000 mg | Freq: Four times a day (QID) | INTRAMUSCULAR | Status: DC | PRN
  Administered 2017-11-10: 4 mg via INTRAVENOUS
  Filled 2017-11-05: qty 2

## 2017-11-05 NOTE — Evaluation (Signed)
Physical Therapy Evaluation Patient Details Name: Katie Huerta MRN: [id number] DOB: 23-Jul-1927 Today's Date: 11/05/2017   History of Present Illness  Katie Huerta is a 84 y.o. female with medical history significant for chronic kidney disease stage III, dementia, history of ureteral cancer status post nephroureterectomy in 2017, bladder biopsy positive for urothelial carcinoma in April, and recent gross hematuria, now presenting to the emergency department after syncopal episode.  Patient has been following with urology for her urothelial carcinoma and has Foley catheter recently placed for obstruction, likely due to clots.  She has been increasingly weak in general in recent days, has reportedly been lightheaded upon standing, and then had a syncopal episode on standing last night that prompted her presentation to the ED.  Patient has dementia that limits her ability to give a history, but she denies any chest pain or palpitations, denies headache.  Episode was witnessed by her husband and she regained consciousness within a minute or 2.  There is no seizure-like activity.  No appreciable injury from the fall.    Clinical Impression  Katie Huerta is a 84 y.o. presenting for PT evaluation with recent decrease in functional mobility secondary to syncope episode and above hospital admission. She is currently functioning slightly below her baseline of independent with no device, and now requires supervision to minimum guarding for transfers/gait with no device. She ambulated ~ 100 feet this session with good tolerance to activity and denied SOB, dizziness, lightheadedness, or fatigue throughout session. No family was present to confirm Katie Huerta PLOF however if she has 24/7 assistance available I anticipate she will be safe to return home when medically ready. Orthostatic BP was taken during session and is listed below, BP remains stable and patient asymptomatic throughout therapy.  She will benefit from skilled PT to address current impairments and activity tolerance throughout acute stay. PT will follow.     Follow Up Recommendations No PT follow up    Equipment Recommendations  None recommended by PT    Recommendations for Other Services       Precautions / Restrictions Precautions Precautions: Fall Restrictions Weight Bearing Restrictions: No       Mobility  Bed Mobility Overal bed mobility: Modified Independent      General bed mobility comments: from flat bed with extra time  Transfers Overall transfer level: Needs assistance Equipment used: Rolling walker (2 wheeled);None;1 person hand held assist Transfers: Sit to/from Stand Sit to Stand: Supervision    General transfer comment: performed sit to stand with RW, HHA, and no device  Ambulation/Gait Ambulation/Gait assistance: Min guard Gait Distance (Feet): 100 Feet Assistive device: 1 person hand held assist;None Gait Pattern/deviations: Step-through pattern;Decreased step length - left;Decreased stride length;Narrow base of support Gait velocity: slow   General Gait Details: forward, backward, and side stepping along bedside with 1 HHA and min guard for safety. Patient with slow gait with no device and min guard.     Balance Overall balance assessment: Needs assistance Sitting-balance support: No upper extremity supported;Feet unsupported Sitting balance-Leahy Scale: Good Sitting balance - Comments: patient able to don/doff socks independently at edge of bed   Standing balance support: No upper extremity supported;During functional activity Standing balance-Leahy Scale: Good Standing balance comment: mild impairments noted during standing march, sidestepping, and gait, no overt LOB         Pertinent Vitals/Pain Pain Assessment: No/denies pain    Vitals:   11/05/17 1226 11/05/17 1230 11/05/17 1239  BP: (!) 105/52 120/66 Marland Kitchen)  125/55  Pulse: 75 69 88  Position Laying Sitting  Standing  SpO2: 99% 100% 100%       Home Living Family/patient expects to be discharged to:: Private residence Living Arrangements: Spouse/significant other Available Help at Discharge: Family;Available 24 hours/day Type of Home: House Home Access: Stairs to enter Entrance Stairs-Rails: None Entrance Stairs-Number of Steps: 1 at front with no rails (7-8 at back entrance, patient does not use this) Home Layout: One level Home Equipment: None      Prior Function Level of Independence: Independent   Comments: Patient repoerts independent with no assistive device for home/community ambulation, reports she still drives, and is independent with all ADL's (no family present to confirm)(patient appears to have baseline cognitive/memory deficits, unclear if accurate historian (no family present to confirm))     Hand Dominance   Dominant Hand: Right    Extremity/Trunk Assessment   Upper Extremity Assessment Upper Extremity Assessment: Overall WFL for tasks assessed    Lower Extremity Assessment Lower Extremity Assessment: Overall WFL for tasks assessed    Cervical / Trunk Assessment Cervical / Trunk Assessment: Normal  Communication   Communication: HOH  Cognition Arousal/Alertness: Awake/alert Behavior During Therapy: WFL for tasks assessed/performed Overall Cognitive Status: History of cognitive impairments - at baseline    General Comments: patient appears to have baseline cognitive/memory deficits, unclear if accurate historian (no family present to confirm)         Exercises Other Exercises Other Exercises: 5 x sit to stand: 14.5 seconds with UE assist to push up from edge of bed   Assessment/Plan    PT Assessment Patient needs continued PT services  PT Problem List Decreased strength;Decreased activity tolerance;Decreased balance;Decreased mobility       PT Treatment Interventions Balance training;Gait training;Neuromuscular re-education;Stair  training;Therapeutic activities;Functional mobility training;Patient/family education;Therapeutic exercise    PT Goals (Current goals can be found in the Care Plan section)  Acute Rehab PT Goals Patient Stated Goal: to return home with family PT Goal Formulation: With patient Time For Goal Achievement: 11/10/17 Potential to Achieve Goals: Good    Frequency Min 3X/week    AM-PAC PT "6 Clicks" Daily Activity  Outcome Measure Difficulty turning over in bed (including adjusting bedclothes, sheets and blankets)?: None Difficulty moving from lying on back to sitting on the side of the bed? : A Little Difficulty sitting down on and standing up from a chair with arms (e.g., wheelchair, bedside commode, etc,.)?: A Little Help needed moving to and from a bed to chair (including a wheelchair)?: A Little Help needed walking in hospital room?: A Little Help needed climbing 3-5 steps with a railing? : A Little 6 Click Score: 19    End of Session Equipment Utilized During Treatment: Gait belt Activity Tolerance: Patient tolerated treatment well Patient left: in bed;with call bell/phone within reach;with bed alarm set Nurse Communication: Mobility status PT Visit Diagnosis: Unsteadiness on feet (R26.81);Difficulty in walking, not elsewhere classified (R26.2);Other abnormalities of gait and mobility (R26.89)    Time: 4665-9935 PT Time Calculation (min) (ACUTE ONLY): 32 min   Charges:   PT Evaluation $PT Eval Low Complexity: 1 Low PT Treatments $Therapeutic Activity: 8-22 mins      Kipp Brood, PT, DPT Physical Therapist with [facility]  11/05/2017 1:49 PM

## 2017-11-05 NOTE — Consult Note (Signed)
Urology Consult   Physician requesting consult: Noberto Retort, MD  Reason for consult: Hematuria  History of Present Illness: Katie Huerta is a 84 y.o. female with a history of dementia, CKD, UCC involving the right ureter s/p right nephroureterectomy in 2017 with Dr. Tresa Moore and a left renal mass. The patient presented to the [facility] ED on 11/04/17 after a syncopal episode.  Over the past week, the patient has developed progressively worsening gross hematuria.  She had a Foley catheter exchange in our office on Friday where her hematuria was found to be mild after catheter irrigation and her hemoglobin was 9.8.  Her hemoglobin dropped to 8.0 in the ED last night, which prompted pRBC transfusion.    She had a CT abd/pel on 10/23/17 with findings concerning for UCC recurrence involving her right bladder base as well as pelvic and lower abdominal lymphadenopathy concerning for metastatic disease.    Currently, she is resting comfortably in bed with no complaints.  Her husband and son are at bedside.    Past Medical History:  Diagnosis Date  . Bradycardia   . CKD (chronic kidney disease), stage III ([place])   . Dementia   . Dysuria   . Hematuria   . 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 History of palpitations   . History of sepsis    07-13-2015  urosepsis  . History of syncope    2013--  per documentation in epic by cardiologist-- dr hochrein -  negative work-up, felt to be vagal episode  . HOH (hard of hearing)    pt want wear wearing aids  . Left renal mass    small solid noted on ct 05/ 2017  . Macular degeneration of both eyes    treatment w/ drops and injection's    Past Surgical History:  Procedure Laterality Date  . APPENDECTOMY  as teen  . CATARACT EXTRACTION W/ INTRAOCULAR LENS  IMPLANT, BILATERAL  2005  . CYSTOSCOPY W/ URETERAL STENT PLACEMENT Right  07/01/2015   Procedure: CYSTOSCOPY WITH RETROGRADE PYELOGRAM/URETERAL STENT PLACEMENT;  Surgeon: Alexis Frock, MD;  Location: [facility];  Service: Urology;  Laterality: Right;  . CYSTOSCOPY WITH RETROGRADE PYELOGRAM, URETEROSCOPY AND STENT PLACEMENT Bilateral 09/25/2015   Procedure: CYSTOSCOPY WITH BILATERAL RETROGRADE PYELOGRAM, RIGHT URETEROSCOPY WITH BIOPSY AND RIGHT  STENT EXCHANGE;  Surgeon: Alexis Frock, MD;  Location: [facility];  Service: Urology;  Laterality: Bilateral;  . CYSTOSCOPY/RETROGRADE/URETEROSCOPY Bilateral 06/21/2017   Procedure: CYSTOSCOPY WITH BILATERAL RETROGRADE PYELOGRAMS, WITH DIAGNOSTIC RIGHT URETEROSCOPY;  Surgeon: Alexis Frock, MD;  Location: [facility];  Service: Urology;  Laterality: Bilateral;  . HOLMIUM LASER APPLICATION Right 7/86/7672   Procedure: LASER ABLATION OF URETERAL MASS;  Surgeon: Alexis Frock, MD;  Location: [facility];  Service: Urology;  Laterality: Right;  . REFRACTIVE SURGERY Bilateral left 2009/  right 2011  . ROBOT ASSITED LAPAROSCOPIC NEPHROURETERECTOMY Right 11/18/2015   Procedure: XI ROBOT ASSITED LAPAROSCOPIC NEPHROURETERECTOMY  laparoscopic adhesiolysis;  Surgeon: Alexis Frock, MD;  Location: [facility];  Service: Urology;  Laterality: Right;  . TRANSTHORACIC ECHOCARDIOGRAM  07-04-2015   severe LVH,  ef 60-65%,  grade 1 diastolic dysfunction/  trivial AR and MR/  mild LAE/  mild TR/  mild increase PASP 12mmHg  . TRANSURETHRAL RESECTION OF BLADDER TUMOR N/A 06/21/2017   Procedure: TRANSURETHRAL RESECTION OF BLADDER TUMOR (TURBT);  Surgeon: Alexis Frock, MD;  Location: [facility];  Service: Urology;  Laterality: N/A;  . VAGINAL HYSTERECTOMY  yrs ago   w/  Bilateral Salpingoophorectomy    Current Hospital Medications:  Home Meds:  Current Meds  Medication Sig  . aspirin EC 81 MG tablet Take 81 mg by mouth daily.  . ciprofloxacin (CIPRO) 250 MG tablet Take 1 tablet by mouth 2  (two) times daily. Starting 11/03/2017 x 5 days.  Marland Kitchen latanoprost (XALATAN) 0.005 % ophthalmic solution Place 1 drop into both eyes at bedtime.   . Multiple Vitamins-Minerals (ICAPS MV PO) Take 1 tablet by mouth daily.  . vitamin B-12 (CYANOCOBALAMIN) 1000 MCG tablet Take 1,000 mcg by mouth daily.    Scheduled Meds: . sodium chloride   Intravenous Once  . ciprofloxacin  250 mg Oral BID  . latanoprost  1 drop Both Eyes QHS  . sodium chloride flush  3 mL Intravenous Q12H  . vitamin B-12  1,000 mcg Oral Daily   Continuous Infusions: PRN Meds:.acetaminophen **OR** acetaminophen, bisacodyl, HYDROcodone-acetaminophen, ondansetron **OR** ondansetron (ZOFRAN) IV, senna-docusate  Allergies:  Allergies  Allergen Reactions  . Penicillins     Unknown childhood reaction    Family History  Problem Relation Age of Onset  . Coronary artery disease Father 70       Died 39s of an MI  . Heart attack Father   . Coronary artery disease Brother 66  . Heart attack Brother   . Coronary artery disease Sister 29  . Heart attack Brother   . Heart attack Brother     Social History:  reports that she quit smoking about 62 years ago. Her smoking use included cigarettes. She quit after 10.00 years of use. She has never used smokeless tobacco. She reports that she does not drink alcohol or use drugs.  ROS: A complete review of systems was performed.  All systems are negative except for pertinent findings as noted.  Physical Exam:  Vital signs in last 24 hours: Temp:  [97.9 F (36.6 C)-98.6 F (37 C)] 98.2 F (36.8 C) (09/08 1348) Pulse Rate:  [64-72] 68 (09/08 1348) Resp:  [16-18] 18 (09/08 1348) BP: (98-120)/(48-81) 98/50 (09/08 1348) SpO2:  [95 %-100 %] 100 % (09/08 1348) Weight:  [54 kg-61.2 kg] 54 kg (09/08 0145) Constitutional:  Disoriented x3 Cardiovascular: Regular rate and rhythm, No JVD Respiratory: Normal respiratory effort, Lungs clear bilaterally GI: Abdomen is soft, nontender,  nondistended, no abdominal masses GU: 20 French Foley in place and draining dark, blood with minimal clots.  Lymphatic: No lymphadenopathy Neuro: good sensation in all ext.  Psychiatric: confused  Laboratory Data:  Recent Labs    11/04/17 2229  WBC 14.4*  HGB 8.0*  HCT 25.2*  PLT 318    Recent Labs    11/04/17 2229 11/05/17 0555  NA 138 141  K 3.7 3.9  CL 107 108  GLUCOSE 130* 89  BUN 21 20  CALCIUM 7.9* 8.0*  CREATININE 1.23* 1.26*     Results for orders placed or performed during the hospital encounter of 11/04/17 (from the past 24 hour(s))  Basic metabolic panel     Status: Abnormal   Collection Time: 11/04/17 10:29 PM  Result Value Ref Range   Sodium 138 135 - 145 mmol/L   Potassium 3.7 3.5 - 5.1 mmol/L   Chloride 107 98 - 111 mmol/L   CO2 23 22 - 32 mmol/L   Glucose, Bld 130 (H) 70 - 99 mg/dL  BUN 21 8 - 23 mg/dL   Creatinine, Ser 1.23 (H) 0.44 - 1.00 mg/dL   Calcium 7.9 (L) 8.9 - 10.3 mg/dL   GFR calc non Af Amer 38 (L) >60 mL/min   GFR calc Af Amer 44 (L) >60 mL/min   Anion gap 8 5 - 15  CBC     Status: Abnormal   Collection Time: 11/04/17 10:29 PM  Result Value Ref Range   WBC 14.4 (H) 4.0 - 10.5 K/uL   RBC 2.98 (L) 3.87 - 5.11 MIL/uL   Hemoglobin 8.0 (L) 12.0 - 15.0 g/dL   HCT 25.2 (L) 36.0 - 46.0 %   MCV 84.6 78.0 - 100.0 fL   MCH 26.8 26.0 - 34.0 pg   MCHC 31.7 30.0 - 36.0 g/dL   RDW 15.8 (H) 11.5 - 15.5 %   Platelets 318 150 - 400 K/uL  CBG monitoring, ED     Status: Abnormal   Collection Time: 11/04/17 11:02 PM  Result Value Ref Range   Glucose-Capillary 123 (H) 70 - 99 mg/dL   Comment 1 Notify RN    Comment 2 Document in Chart   Troponin I     Status: None   Collection Time: 11/04/17 11:27 PM  Result Value Ref Range   Troponin I <0.03 <0.03 ng/mL  Type and screen [facility]     Status: None (Preliminary result)   Collection Time: 11/04/17 11:27 PM  Result Value Ref Range   ABO/RH(D) O POS    Antibody Screen NEG    Sample  Expiration 11/07/2017    Unit Number [id number]    Blood Component Type RED CELLS,LR    Unit division 00    Status of Unit ISSUED    Transfusion Status OK TO TRANSFUSE    Crossmatch Result      Compatible Performed at [facility], [address], [place], [place] [address]   ABO/Rh     Status: None   Collection Time: 11/04/17 11:27 PM  Result Value Ref Range   ABO/RH(D)      O POS Performed at [facility], [address], [place], [place] [address]   Urinalysis, Routine w reflex microscopic     Status: Abnormal   Collection Time: 11/05/17 12:10 AM  Result Value Ref Range   Color, Urine RED (A) YELLOW   APPearance TURBID (A) CLEAR   Specific Gravity, Urine  1.005 - 1.030    TEST NOT REPORTED DUE TO COLOR INTERFERENCE OF URINE PIGMENT   pH  5.0 - 8.0    TEST NOT REPORTED DUE TO COLOR INTERFERENCE OF URINE PIGMENT   Glucose, UA (A) NEGATIVE mg/dL    TEST NOT REPORTED DUE TO COLOR INTERFERENCE OF URINE PIGMENT   Hgb urine dipstick (A) NEGATIVE    TEST NOT REPORTED DUE TO COLOR INTERFERENCE OF URINE PIGMENT   Bilirubin Urine (A) NEGATIVE    TEST NOT REPORTED DUE TO COLOR INTERFERENCE OF URINE PIGMENT   Ketones, ur (A) NEGATIVE mg/dL    TEST NOT REPORTED DUE TO COLOR INTERFERENCE OF URINE PIGMENT   Protein, ur (A) NEGATIVE mg/dL    TEST NOT REPORTED DUE TO COLOR INTERFERENCE OF URINE PIGMENT   Nitrite (A) NEGATIVE    TEST NOT REPORTED DUE TO COLOR INTERFERENCE OF URINE PIGMENT   Leukocytes, UA (A) NEGATIVE    TEST NOT REPORTED DUE TO COLOR INTERFERENCE OF URINE PIGMENT  Urinalysis, Microscopic (reflex)     Status: Abnormal   Collection Time: 11/05/17  12:10 AM  Result Value Ref Range   RBC / HPF >50 0 - 5 RBC/hpf   WBC, UA 21-50 0 - 5 WBC/hpf   Bacteria, UA RARE (A) NONE SEEN   Squamous Epithelial / LPF 0-5 0 - 5  Prepare RBC     Status: None   Collection Time: 11/05/17 12:34 AM  Result Value Ref Range   Order Confirmation      ORDER PROCESSED BY BLOOD BANK Performed  at [facility], [address] [place],  [address]   Basic metabolic panel     Status: Abnormal   Collection Time: 11/05/17  5:55 AM  Result Value Ref Range   Sodium 141 135 - 145 mmol/L   Potassium 3.9 3.5 - 5.1 mmol/L   Chloride 108 98 - 111 mmol/L   CO2 22 22 - 32 mmol/L   Glucose, Bld 89 70 - 99 mg/dL   BUN 20 8 - 23 mg/dL   Creatinine, Ser 1.26 (H) 0.44 - 1.00 mg/dL   Calcium 8.0 (L) 8.9 - 10.3 mg/dL   GFR calc non Af Amer 37 (L) >60 mL/min   GFR calc Af Amer 43 (L) >60 mL/min   Anion gap 11 5 - 15  Glucose, capillary     Status: None   Collection Time: 11/05/17  7:19 AM  Result Value Ref Range   Glucose-Capillary 86 70 - 99 mg/dL   No results found for this or any previous visit (from the past 240 hour(s)).  Renal Function: Recent Labs    11/04/17 2229 11/05/17 0555  CREATININE 1.23* 1.26*   Estimated Creatinine Clearance: 25.5 mL/min (A) (by C-G formula based on SCr of 1.26 mg/dL (H)).  Radiologic Imaging: No results found.  I independently reviewed the above imaging studies.  Impression/Recommendation 1.  Gross hematuria -  I hand irrigated her catheter this evening and got her irrigant clear to light pink.  Minimal clots were removed. PRN hand irrigation of her catheter ordered.   2.  Symptomatic anemia- Continue to transfuse PRN.  3.  History of right upper tract UCC s/p right nephroureterectomy in 2017--now with possible bladder recurrence and lymph node metastasis- Will plan to transfer the patient to [facility] tomorrow AM with tentative plans to proceed with cystoscopy with Dr. Tresa Moore on 11/08/17 4.  Dementia- stable 5.  CKD III- creatinine at baseline   Ellison Hughs, [facility] 11/05/2017, 5:19 PM

## 2017-11-05 NOTE — H&P (Signed)
History and Physical    Katie Huerta:[id number] DOB: 1927-12-28 DOA: 11/04/2017  PCP: [name], MD   Patient coming from: Home   Chief Complaint: Gross hematuria, generalized weakness, lightheaded, syncope   HPI: Katie Huerta is a 84 y.o. female with medical history significant for chronic kidney disease stage III, dementia, history of ureteral cancer status post nephroureterectomy in 2017, bladder biopsy positive for urothelial carcinoma in April, and recent gross hematuria, now presenting to the emergency department after syncopal episode.  Patient has been following with urology for her urothelial carcinoma and has Foley catheter recently placed for obstruction, likely due to clots.  She has been increasingly weak in general in recent days, has reportedly been lightheaded upon standing, and then had a syncopal episode on standing last night that prompted her presentation to the ED.  Patient has dementia that limits her ability to give a history, but she denies any chest pain or palpitations, denies headache.  Episode was witnessed by her husband and she regained consciousness within a minute or 2.  There is no seizure-like activity.  No appreciable injury from the fall.  ED Course: Upon arrival to the ED, patient is found to be afebrile, saturating well on room air, and with vitals otherwise normal.  EKG features a sinus rhythm.  Chemistry panel is notable for creatinine 1.23, consistent with her apparent baseline.  CBC is notable for leukocytosis to 14,400 and a hemoglobin of 8.0, down from 11.2 two weeks ago.  Type and screen was performed and ED physician discussed the case with urology.  Patient remains hemodynamically stable in the ED and will be observed for ongoing evaluation and management of syncopal episode suspected secondary to orthostasis from recent blood loss from the urinary tract.  Review of Systems:  All other systems reviewed and apart from HPI, are  negative.  Past Medical History:  Diagnosis Date  . Bradycardia   . CKD (chronic kidney disease), stage III ([place])   . Dementia   . Dysuria   . Hematuria   . 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 History of palpitations   . History of sepsis    07-13-2015  urosepsis  . History of syncope    2013--  per documentation in epic by cardiologist-- dr hochrein -  negative work-up, felt to be vagal episode  . HOH (hard of hearing)    pt want wear wearing aids  . Left renal mass    small solid noted on ct 05/ 2017  . Macular degeneration of both eyes    treatment w/ drops and injection's    Past Surgical History:  Procedure Laterality Date  . APPENDECTOMY  as teen  . CATARACT EXTRACTION W/ INTRAOCULAR LENS  IMPLANT, BILATERAL  2005  . CYSTOSCOPY W/ URETERAL STENT PLACEMENT Right 07/01/2015   Procedure: CYSTOSCOPY WITH RETROGRADE PYELOGRAM/URETERAL STENT PLACEMENT;  Surgeon: Alexis Frock, MD;  Location: [facility];  Service: Urology;  Laterality: Right;  . CYSTOSCOPY WITH RETROGRADE PYELOGRAM, URETEROSCOPY AND STENT PLACEMENT Bilateral 09/25/2015   Procedure: CYSTOSCOPY WITH BILATERAL RETROGRADE PYELOGRAM, RIGHT URETEROSCOPY WITH BIOPSY AND RIGHT  STENT EXCHANGE;  Surgeon: Alexis Frock, MD;  Location: [facility];  Service: Urology;  Laterality: Bilateral;  . CYSTOSCOPY/RETROGRADE/URETEROSCOPY Bilateral 06/21/2017   Procedure: CYSTOSCOPY WITH BILATERAL RETROGRADE PYELOGRAMS, WITH DIAGNOSTIC RIGHT URETEROSCOPY;  Surgeon: Alexis Frock,  MD;  Location: [facility];  Service: Urology;  Laterality: Bilateral;  . HOLMIUM LASER APPLICATION Right 05/21/5571   Procedure: LASER ABLATION OF URETERAL MASS;  Surgeon: Alexis Frock, MD;  Location: [facility];  Service: Urology;  Laterality: Right;  . REFRACTIVE SURGERY Bilateral left  2009/  right 2011  . ROBOT ASSITED LAPAROSCOPIC NEPHROURETERECTOMY Right 11/18/2015   Procedure: XI ROBOT ASSITED LAPAROSCOPIC NEPHROURETERECTOMY  laparoscopic adhesiolysis;  Surgeon: Alexis Frock, MD;  Location: [facility];  Service: Urology;  Laterality: Right;  . TRANSTHORACIC ECHOCARDIOGRAM  07-04-2015   severe LVH,  ef 60-65%,  grade 1 diastolic dysfunction/  trivial AR and MR/  mild LAE/  mild TR/  mild increase PASP 74mmHg  . TRANSURETHRAL RESECTION OF BLADDER TUMOR N/A 06/21/2017   Procedure: TRANSURETHRAL RESECTION OF BLADDER TUMOR (TURBT);  Surgeon: Alexis Frock, MD;  Location: [facility];  Service: Urology;  Laterality: N/A;  . VAGINAL HYSTERECTOMY  yrs ago   w/  Bilateral Salpingoophorectomy     reports that she quit smoking about 62 years ago. Her smoking use included cigarettes. She quit after 10.00 years of use. She has never used smokeless tobacco. She reports that she does not drink alcohol or use drugs.  Allergies  Allergen Reactions  . Penicillins     Unknown childhood reaction    Family History  Problem Relation Age of Onset  . Coronary artery disease Father 57       Died 61s of an MI  . Heart attack Father   . Coronary artery disease Brother 28  . Heart attack Brother   . Coronary artery disease Sister 39  . Heart attack Brother   . Heart attack Brother      Prior to Admission medications   Medication Sig Start Date End Date Taking? Authorizing Provider  aspirin EC 81 MG tablet Take 81 mg by mouth daily.   Yes [provider]  ciprofloxacin (CIPRO) 250 MG tablet Take 1 tablet by mouth 2 (two) times daily. Starting 11/03/2017 x 5 days. 11/03/17  Yes [provider]  latanoprost (XALATAN) 0.005 % ophthalmic solution Place 1 drop into both eyes at bedtime.  09/17/12  Yes [provider]  Multiple Vitamins-Minerals (ICAPS MV PO) Take 1 tablet by mouth daily.   Yes [provider]  vitamin B-12 (CYANOCOBALAMIN) 1000  MCG tablet Take 1,000 mcg by mouth daily.   Yes [provider]    Physical Exam: Vitals:   11/04/17 2053 11/04/17 2108  BP:  116/63  Pulse:  72  Resp:  18  Temp:  98.3 F (36.8 C)  TempSrc:  Oral  SpO2:  95%  Weight: 61.2 kg   Height: 5\' 3"  (1.6 m)      Constitutional: NAD, calm  Eyes: PERTLA, lids and conjunctivae normal ENMT: Mucous membranes are moist. Posterior pharynx clear of any exudate or lesions.   Neck: normal, supple, no masses, no thyromegaly Respiratory: clear to auscultation bilaterally, no wheezing, no crackles. Normal respiratory effort.    Cardiovascular: S1 & S2 heard, regular rate and rhythm. No extremity edema.  Abdomen: No distension, no tenderness, soft. Bowel sounds normal.  Musculoskeletal: no clubbing / cyanosis. No joint deformity upper and lower extremities.    Skin: no significant rashes, lesions, ulcers. Poor turgor. Neurologic: No facial asymmetry. Sensation intact. Moving all extremities.  Psychiatric: Alert and oriented to person and place only. Pleasant and cooperative.    Labs on Admission: I have personally reviewed following  labs and imaging studies  CBC: Recent Labs  Lab 11/04/17 2229  WBC 14.4*  HGB 8.0*  HCT 25.2*  MCV 84.6  PLT 810   Basic Metabolic Panel: Recent Labs  Lab 11/04/17 2229  NA 138  K 3.7  CL 107  CO2 23  GLUCOSE 130*  BUN 21  CREATININE 1.23*  CALCIUM 7.9*   GFR: Estimated Creatinine Clearance: 26.2 mL/min (A) (by C-G formula based on SCr of 1.23 mg/dL (H)). Liver Function Tests: No results for input(s): AST, ALT, ALKPHOS, BILITOT, PROT, ALBUMIN in the last 168 hours. No results for input(s): LIPASE, AMYLASE in the last 168 hours. No results for input(s): AMMONIA in the last 168 hours. Coagulation Profile: No results for input(s): INR, PROTIME in the last 168 hours. Cardiac Enzymes: Recent Labs  Lab 11/04/17 2327  TROPONINI <0.03   BNP (last 3 results) No results for input(s): PROBNP  in the last 8760 hours. HbA1C: No results for input(s): HGBA1C in the last 72 hours. CBG: Recent Labs  Lab 11/04/17 2302  GLUCAP 123*   Lipid Profile: No results for input(s): CHOL, HDL, LDLCALC, TRIG, CHOLHDL, LDLDIRECT in the last 72 hours. Thyroid Function Tests: No results for input(s): TSH, T4TOTAL, FREET4, T3FREE, THYROIDAB in the last 72 hours. Anemia Panel: No results for input(s): VITAMINB12, FOLATE, FERRITIN, TIBC, IRON, RETICCTPCT in the last 72 hours. Urine analysis:    Component Value Date/Time   COLORURINE RED (A) 11/05/2017 0010   APPEARANCEUR TURBID (A) 11/05/2017 0010   APPEARANCEUR Turbid (A) 10/16/2017 1544   LABSPEC  11/05/2017 0010    TEST NOT REPORTED DUE TO COLOR INTERFERENCE OF URINE PIGMENT   PHURINE  11/05/2017 0010    TEST NOT REPORTED DUE TO COLOR INTERFERENCE OF URINE PIGMENT   GLUCOSEU (A) 11/05/2017 0010    TEST NOT REPORTED DUE TO COLOR INTERFERENCE OF URINE PIGMENT   HGBUR (A) 11/05/2017 0010    TEST NOT REPORTED DUE TO COLOR INTERFERENCE OF URINE PIGMENT   BILIRUBINUR (A) 11/05/2017 0010    TEST NOT REPORTED DUE TO COLOR INTERFERENCE OF URINE PIGMENT   BILIRUBINUR Negative 10/16/2017 1544   KETONESUR (A) 11/05/2017 0010    TEST NOT REPORTED DUE TO COLOR INTERFERENCE OF URINE PIGMENT   PROTEINUR (A) 11/05/2017 0010    TEST NOT REPORTED DUE TO COLOR INTERFERENCE OF URINE PIGMENT   UROBILINOGEN negative 03/09/2015 1734   NITRITE (A) 11/05/2017 0010    TEST NOT REPORTED DUE TO COLOR INTERFERENCE OF URINE PIGMENT   LEUKOCYTESUR (A) 11/05/2017 0010    TEST NOT REPORTED DUE TO COLOR INTERFERENCE OF URINE PIGMENT   LEUKOCYTESUR 3+ (A) 10/16/2017 1544   Sepsis Labs: @LABRCNTIP (procalcitonin:4,lacticidven:4) )No results found for this or any previous visit (from the past 240 hour(s)).   Radiological Exams on Admission: No results found.  EKG: Independently reviewed. Sinus rhythm.   Assessment/Plan   1. Syncope  - Presents with gross  hematuria, generalized weakness, lightheadedness on standing, and a syncopal episode  - EKG with sinus rhythm, no arrhythmia on monitor in ED, denies chest pain or palpitations  - Likely orthostatic and secondary to blood-loss and poor appetite  - Check orthostatic vitals, continue cardiac monitoring, transfuse 1 unit RBC, continue IVF hydration  2. Symptomatic anemia; gross hematuria with blood-loss anemia  - Presents with generalized weakness, lightheaded on standing, and then a syncopal episode last night upon standing  - Hgb is 8.0 on admission, down from 11.2 two weeks earlier  - Secondary to urothelial carcinoma;  Foley draining well after irrigation in ED  - Transfuse one unit RBCs, check post-transfusion CBC, hold ASA    3. Urothelial carcinoma  - Following with urology, status-post right nephro-ureterectomy in 2017 and with bladder biopsy positive for urothelial carcinoma in April  - Complicated by gross hematuria and obstructing clots, now with Foley catheter, draining well after irrigation in ED  - She will continue urology follow-up, scheduled to see Dr. Tresa Moore on 11/07/17 per family report    4. CKD stage III  - SCr is 1.23 on admission, consistent with her apparent baseline  - Foley is draining well  - Renally-dose medications    5. UTI  - Started on Cipro by urology 11/03/17  - Leukocytosis noted, but not septic - Send urine for culture, continue Cipro      DVT prophylaxis: SCD's Code Status: Full  Family Communication: Husband and son updated at bedside Consults called: ED physician discussed with urology Admission status: Observation     Vianne Bulls, MD [facility] Pager [phone]  If 7PM-7AM, please contact night-coverage [email or web address] Password [facility]  11/05/2017, 12:36 AM

## 2017-11-05 NOTE — Progress Notes (Signed)
Telephone consent obtained via telephone from patients husband.  B Foley, 2nd verification.  Consent signed and in chart

## 2017-11-05 NOTE — Progress Notes (Signed)
Patient seen and examined. Admitted after midnights secondary to syncope. Most likely orthostatic in nature, given hx of ongoing blood loss from urothelial bladder cancer and recent decrease PO intake. Patient pleasantly confused (had hx of dementia) and denying CP, SOB, palpitations, nausea, vomiting or any other complaints. Per chart review her Hgb is down to 8.0 from 11.2 (las CBC for comparison 2 weeks ago); on exam still bloody urine appreciated. Please refer to H&P written by Dr. Myna Hidalgo for further info/details on admission.   Barton Dubois MD [phone]

## 2017-11-05 NOTE — ED Provider Notes (Addendum)
[facility] EMERGENCY DEPARTMENT Provider Note   CSN: [id number] Arrival date & time: 11/04/17  2100     History   Chief Complaint Chief Complaint  Patient presents with  . Weakness    HPI Katie Huerta is a 84 y.o. female. Level 5 caveat due to dementia. HPI Patient presents with generalized weakness and a syncopal episode today.  She has a Foley catheter in for hematuria and bladder abnormality.  Sees Dr. Tresa Moore from urology but saw Dr. Lovena Neighbours yesterday.  Reportedly had irrigation that helped clear up the blood and had a CAT scan that showed retention previously.  Foley catheter placed and has had bleeding over the last couple weeks.  Has dementia so history comes from family.  Has been more lightheaded standing and had an episode today where she passed out.  Husband states that he thought that she had died.  Has had somewhat decreased catheter output.  Had been irrigated in the office and had some clots come out.  States has been flowing somewhat well since then.  Has planned to see Dr. Tresa Moore on Monday. Past Medical History:  Diagnosis Date  . Bradycardia   . CKD (chronic kidney disease), stage III ([place])   . Dementia   . Dysuria   . Hematuria   . 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 History of palpitations   . History of sepsis    07-13-2015  urosepsis  . History of syncope    2013--  per documentation in epic by cardiologist-- dr hochrein -  negative work-up, felt to be vagal episode  . HOH (hard of hearing)    pt want wear wearing aids  . Left renal mass    small solid noted on ct 05/ 2017  . Macular degeneration of both eyes    treatment w/ drops and injection's    Patient Active Problem List   Diagnosis Date Noted  . Stage 3 chronic kidney disease ([place]) 11/12/2016  . Ureteral mass 09/23/2015  . Sepsis due to urinary tract  infection ([place]) 07/01/2015  . ARF (acute renal failure) ([place]) 06/30/2015  . Chronic anemia 06/30/2015  . Vitamin B 12 deficiency 05/21/2015  . Dementia 03/25/2015  . Hearing decreased 03/25/2015  . Syncope 10/27/2011    Past Surgical History:  Procedure Laterality Date  . APPENDECTOMY  as teen  . CATARACT EXTRACTION W/ INTRAOCULAR LENS  IMPLANT, BILATERAL  2005  . CYSTOSCOPY W/ URETERAL STENT PLACEMENT Right 07/01/2015   Procedure: CYSTOSCOPY WITH RETROGRADE PYELOGRAM/URETERAL STENT PLACEMENT;  Surgeon: Alexis Frock, MD;  Location: [facility];  Service: Urology;  Laterality: Right;  . CYSTOSCOPY WITH RETROGRADE PYELOGRAM, URETEROSCOPY AND STENT PLACEMENT Bilateral 09/25/2015   Procedure: CYSTOSCOPY WITH BILATERAL RETROGRADE PYELOGRAM, RIGHT URETEROSCOPY WITH BIOPSY AND RIGHT  STENT EXCHANGE;  Surgeon: Alexis Frock, MD;  Location: [facility];  Service: Urology;  Laterality: Bilateral;  . CYSTOSCOPY/RETROGRADE/URETEROSCOPY Bilateral 06/21/2017   Procedure: CYSTOSCOPY WITH BILATERAL RETROGRADE PYELOGRAMS, WITH DIAGNOSTIC RIGHT URETEROSCOPY;  Surgeon: Alexis Frock, MD;  Location: [facility];  Service: Urology;  Laterality: Bilateral;  . HOLMIUM LASER APPLICATION Right 7/35/3299   Procedure: LASER ABLATION OF URETERAL MASS;  Surgeon: Alexis Frock, MD;  Location: [facility];  Service: Urology;  Laterality: Right;  . REFRACTIVE SURGERY Bilateral left 2009/  right 2011  . ROBOT ASSITED LAPAROSCOPIC NEPHROURETERECTOMY  Right 11/18/2015   Procedure: XI ROBOT ASSITED LAPAROSCOPIC NEPHROURETERECTOMY  laparoscopic adhesiolysis;  Surgeon: Alexis Frock, MD;  Location: [facility];  Service: Urology;  Laterality: Right;  . TRANSTHORACIC ECHOCARDIOGRAM  07-04-2015   severe LVH,  ef 60-65%,  grade 1 diastolic dysfunction/  trivial AR and MR/  mild LAE/  mild TR/  mild increase PASP 52mmHg  . TRANSURETHRAL RESECTION OF BLADDER TUMOR N/A 06/21/2017   Procedure:  TRANSURETHRAL RESECTION OF BLADDER TUMOR (TURBT);  Surgeon: Alexis Frock, MD;  Location: [facility];  Service: Urology;  Laterality: N/A;  . VAGINAL HYSTERECTOMY  yrs ago   w/  Bilateral Salpingoophorectomy     OB History   None      Home Medications    Prior to Admission medications   Medication Sig Start Date End Date Taking? Authorizing Provider  aspirin EC 81 MG tablet Take 81 mg by mouth daily.   Yes [provider]  ciprofloxacin (CIPRO) 250 MG tablet Take 1 tablet by mouth 2 (two) times daily. Starting 11/03/2017 x 5 days. 11/03/17  Yes [provider]  latanoprost (XALATAN) 0.005 % ophthalmic solution Place 1 drop into both eyes at bedtime.  09/17/12  Yes [provider]  Multiple Vitamins-Minerals (ICAPS MV PO) Take 1 tablet by mouth daily.   Yes [provider]  vitamin B-12 (CYANOCOBALAMIN) 1000 MCG tablet Take 1,000 mcg by mouth daily.   Yes [provider]    Family History Family History  Problem Relation Age of Onset  . Coronary artery disease Father 52       Died 30s of an MI  . Heart attack Father   . Coronary artery disease Brother 66  . Heart attack Brother   . Coronary artery disease Sister 88  . Heart attack Brother   . Heart attack Brother     Social History Social History   Tobacco Use  . Smoking status: Former Smoker    Years: 10.00    Types: Cigarettes    Last attempt to quit: 10/27/1955    Years since quitting: 62.0  . Smokeless tobacco: Never Used  Substance Use Topics  . Alcohol use: No  . Drug use: No     Allergies   Penicillins   Review of Systems Review of Systems  Unable to perform ROS: Dementia  Constitutional: Positive for appetite change.     Physical Exam Updated Vital Signs BP 116/63   Pulse 72   Temp 98.3 F (36.8 C) (Oral)   Resp 18   Ht 5\' 3"  (1.6 m)   Wt 61.2 kg   SpO2 95%   BMI 23.91 kg/m   Physical Exam  Constitutional: She appears  well-developed.  HENT:  Head: Normocephalic.  Eyes: Pupils are equal, round, and reactive to light.  Neck: Neck supple.  Cardiovascular: Normal rate.  Pulmonary/Chest: She exhibits no tenderness.  Abdominal: She exhibits mass. There is tenderness.  Musculoskeletal: She exhibits no tenderness.  Neurological: She is alert.  Baseline dementia.  Skin: Skin is warm. Capillary refill takes less than 2 seconds.     ED Treatments / Results  Labs (all labs ordered are listed, but only abnormal results are displayed) Labs Reviewed  BASIC METABOLIC PANEL - Abnormal; Notable for the following components:      Result Value   Glucose, Bld 130 (*)    Creatinine, Ser 1.23 (*)    Calcium 7.9 (*)    GFR calc non Af Amer 38 (*)  GFR calc Af Amer 44 (*)    All other components within normal limits  CBC - Abnormal; Notable for the following components:   WBC 14.4 (*)    RBC 2.98 (*)    Hemoglobin 8.0 (*)    HCT 25.2 (*)    RDW 15.8 (*)    All other components within normal limits  CBG MONITORING, ED - Abnormal; Notable for the following components:   Glucose-Capillary 123 (*)    All other components within normal limits  URINALYSIS, ROUTINE W REFLEX MICROSCOPIC  TROPONIN I  TYPE AND SCREEN    EKG None  Radiology No results found.  Procedures Procedures (including critical care time)  Medications Ordered in ED Medications - No data to display   Initial Impression / Assessment and Plan / ED Course  I have reviewed the triage vital signs and the nursing notes.  Pertinent labs & imaging results that were available during my care of the patient were reviewed by me and considered in my medical decision making (see chart for details).     Patient with anemia.  Likely secondary to hematuria that has been going for the last couple weeks.  Has known bladder mass that she supposed to have follow-up with on Monday with Dr. Tresa Moore.  However with syncope and her hemoglobin going to 8 from  11 I feel patient would benefit from admission for blood transfusion.  Discussed with Dr. Lovena Neighbours from urology who saw the patient yesterday.  Feels patient can stay up at any pen and would not need transfer down to [place] long if she can be discharged tomorrow.  Otherwise may benefit as an inpatient transfer to see urology/Dr. Tresa Moore on Monday.  CRITICAL CARE Performed by: Davonna Belling Total critical care time: 30 minutes Critical care time was exclusive of separately billable procedures and treating other patients. Critical care was necessary to treat or prevent imminent or life-threatening deterioration. Critical care was time spent personally by me on the following activities: development of treatment plan with patient and/or surrogate as well as nursing, discussions with consultants, evaluation of patient's response to treatment, examination of patient, obtaining history from patient or surrogate, ordering and performing treatments and interventions, ordering and review of laboratory studies, ordering and review of radiographic studies, pulse oximetry and re-evaluation of patient's condition.   Final Clinical Impressions(s) / ED Diagnoses   Final diagnoses:  Acute blood loss anemia  Gross hematuria  Urinary retention  Syncope, unspecified syncope type    ED Discharge Orders    None       Davonna Belling, MD 11/05/17 0011    Davonna Belling, MD 11/22/17 [id number]

## 2017-11-05 NOTE — Plan of Care (Signed)
  Problem: Acute Rehab PT Goals(only PT should resolve) Goal: Patient Will Transfer Sit To/From Stand Outcome: Progressing Flowsheets (Taken 11/05/2017 1401) Patient will transfer sit to/from stand: with modified independence Goal: Pt Will Transfer Bed To Chair/Chair To Bed Outcome: Progressing Flowsheets (Taken 11/05/2017 1401) Pt will Transfer Bed to Chair/Chair to Bed: with modified independence Goal: Pt Will Ambulate Outcome: Progressing Flowsheets (Taken 11/05/2017 1401) Pt will Ambulate: 100 feet; with modified independence

## 2017-11-05 NOTE — Progress Notes (Signed)
Spoke with Dr. Myna Hidalgo regarding blood  Transfusion and consent not being able to obtain from family.  Dr. Myna Hidalgo gave ok to wait until Sunday 11/05/17 during the day, to give blood after family can be reached for consent.  Also spoke to Dr. Myna Hidalgo regarding orthostatic vitals as patient is unable to stand and is not really able to sit up with out falling back in the bed.  Dr stated it was ok not to obtain the orthostatic vitals.

## 2017-11-06 DIAGNOSIS — Z8554 Personal history of malignant neoplasm of ureter: Secondary | ICD-10-CM | POA: Diagnosis not present

## 2017-11-06 DIAGNOSIS — Z7189 Other specified counseling: Secondary | ICD-10-CM | POA: Diagnosis not present

## 2017-11-06 DIAGNOSIS — H3530 Unspecified macular degeneration: Secondary | ICD-10-CM | POA: Diagnosis present

## 2017-11-06 DIAGNOSIS — D62 Acute posthemorrhagic anemia: Secondary | ICD-10-CM | POA: Diagnosis not present

## 2017-11-06 DIAGNOSIS — Z9841 Cataract extraction status, right eye: Secondary | ICD-10-CM | POA: Diagnosis not present

## 2017-11-06 DIAGNOSIS — N3289 Other specified disorders of bladder: Secondary | ICD-10-CM | POA: Diagnosis not present

## 2017-11-06 DIAGNOSIS — Z7982 Long term (current) use of aspirin: Secondary | ICD-10-CM | POA: Diagnosis not present

## 2017-11-06 DIAGNOSIS — Z79899 Other long term (current) drug therapy: Secondary | ICD-10-CM | POA: Diagnosis not present

## 2017-11-06 DIAGNOSIS — Z880 Allergy status to penicillin: Secondary | ICD-10-CM | POA: Diagnosis not present

## 2017-11-06 DIAGNOSIS — R319 Hematuria, unspecified: Secondary | ICD-10-CM | POA: Diagnosis present

## 2017-11-06 DIAGNOSIS — E538 Deficiency of other specified B group vitamins: Secondary | ICD-10-CM | POA: Diagnosis present

## 2017-11-06 DIAGNOSIS — F0390 Unspecified dementia without behavioral disturbance: Secondary | ICD-10-CM | POA: Diagnosis present

## 2017-11-06 DIAGNOSIS — N390 Urinary tract infection, site not specified: Secondary | ICD-10-CM | POA: Diagnosis not present

## 2017-11-06 DIAGNOSIS — Z90710 Acquired absence of both cervix and uterus: Secondary | ICD-10-CM | POA: Diagnosis not present

## 2017-11-06 DIAGNOSIS — C661 Malignant neoplasm of right ureter: Secondary | ICD-10-CM | POA: Diagnosis not present

## 2017-11-06 DIAGNOSIS — Z87891 Personal history of nicotine dependence: Secondary | ICD-10-CM | POA: Diagnosis not present

## 2017-11-06 DIAGNOSIS — L8990 Pressure ulcer of unspecified site, unspecified stage: Secondary | ICD-10-CM | POA: Diagnosis present

## 2017-11-06 DIAGNOSIS — Z8249 Family history of ischemic heart disease and other diseases of the circulatory system: Secondary | ICD-10-CM | POA: Diagnosis not present

## 2017-11-06 DIAGNOSIS — R55 Syncope and collapse: Secondary | ICD-10-CM | POA: Diagnosis not present

## 2017-11-06 DIAGNOSIS — H9190 Unspecified hearing loss, unspecified ear: Secondary | ICD-10-CM | POA: Diagnosis present

## 2017-11-06 DIAGNOSIS — Z515 Encounter for palliative care: Secondary | ICD-10-CM | POA: Diagnosis not present

## 2017-11-06 DIAGNOSIS — Z66 Do not resuscitate: Secondary | ICD-10-CM | POA: Diagnosis present

## 2017-11-06 DIAGNOSIS — N183 Chronic kidney disease, stage 3 (moderate): Secondary | ICD-10-CM | POA: Diagnosis present

## 2017-11-06 DIAGNOSIS — Z90722 Acquired absence of ovaries, bilateral: Secondary | ICD-10-CM | POA: Diagnosis not present

## 2017-11-06 DIAGNOSIS — C678 Malignant neoplasm of overlapping sites of bladder: Secondary | ICD-10-CM | POA: Diagnosis not present

## 2017-11-06 DIAGNOSIS — Z961 Presence of intraocular lens: Secondary | ICD-10-CM | POA: Diagnosis present

## 2017-11-06 DIAGNOSIS — Z9842 Cataract extraction status, left eye: Secondary | ICD-10-CM | POA: Diagnosis not present

## 2017-11-06 DIAGNOSIS — C689 Malignant neoplasm of urinary organ, unspecified: Secondary | ICD-10-CM

## 2017-11-06 DIAGNOSIS — C679 Malignant neoplasm of bladder, unspecified: Secondary | ICD-10-CM | POA: Diagnosis not present

## 2017-11-06 DIAGNOSIS — R310 Gross hematuria: Secondary | ICD-10-CM | POA: Diagnosis not present

## 2017-11-06 LAB — URINE CULTURE: Culture: NO GROWTH

## 2017-11-06 LAB — CBC
HEMATOCRIT: 24.7 % — AB (ref 36.0–46.0)
Hemoglobin: 7.9 g/dL — ABNORMAL LOW (ref 12.0–15.0)
MCH: 27.6 pg (ref 26.0–34.0)
MCHC: 32 g/dL (ref 30.0–36.0)
MCV: 86.4 fL (ref 78.0–100.0)
Platelets: 264 10*3/uL (ref 150–400)
RBC: 2.86 MIL/uL — ABNORMAL LOW (ref 3.87–5.11)
RDW: 15.5 % (ref 11.5–15.5)
WBC: 9 10*3/uL (ref 4.0–10.5)

## 2017-11-06 LAB — PREPARE RBC (CROSSMATCH)

## 2017-11-06 LAB — GLUCOSE, CAPILLARY: GLUCOSE-CAPILLARY: 85 mg/dL (ref 70–99)

## 2017-11-06 MED ORDER — FUROSEMIDE 10 MG/ML IJ SOLN
30.0000 mg | Freq: Once | INTRAMUSCULAR | Status: AC
  Administered 2017-11-06: 30 mg via INTRAVENOUS
  Filled 2017-11-06: qty 4

## 2017-11-06 MED ORDER — SODIUM CHLORIDE 0.9% IV SOLUTION
Freq: Once | INTRAVENOUS | Status: AC
  Administered 2017-11-06: 10:00:00 via INTRAVENOUS

## 2017-11-06 MED ORDER — HYDROCODONE-ACETAMINOPHEN 5-325 MG PO TABS
1.0000 | ORAL_TABLET | Freq: Four times a day (QID) | ORAL | Status: DC | PRN
  Administered 2017-11-06 – 2017-11-10 (×3): 1 via ORAL
  Filled 2017-11-06 (×3): qty 1

## 2017-11-06 NOTE — Progress Notes (Signed)
PROGRESS NOTE    Katie Huerta  JJK:[id number] DOB: 07-Mar-1927 DOA: 11/04/2017 PCP: [name], MD     Brief Narrative:  84 y.o. female with medical history significant for chronic kidney disease stage III, dementia, history of ureteral cancer status post nephroureterectomy in 2017, bladder biopsy positive for urothelial carcinoma in April, and recent gross hematuria, now presenting to the emergency department after syncopal episode.  Patient has been following with urology for her urothelial carcinoma and has Foley catheter recently placed for obstruction, likely due to clots.  She has been increasingly weak in general in recent days, has reportedly been lightheaded upon standing, and then had a brief syncopal episode on standing the night of admission.  Patient has dementia that limits her ability to give a history, but she denies any chest pain or palpitations, focal weakness SOB or any other complaints. Found with acute blood loss anemia (hgb 8.0 down from 11.2). Ongoing gross hematuria appreciated.   Assessment & Plan: 1-Syncope: -In the setting of orthostatics from poor p.o. intake and ongoing hematuria with blood loss. -Patient had receive 1 unit of PRBCs transfuse on 9/8, will receive 2 units of PRBCs today 11/06/2017 -She still pale on exam and feeling very weak and fatigued. -Hemoglobin 7.9  -ongoing gross hematuria appreciated. -Follow CBC and further transfuse as needed. -Lasix IV between transfusion has been ordered.  2-Dementia -Without behavioral disturbances -Appears to be at baseline -Continue supportive care.  3-Stage 3 chronic kidney disease ([place]) -Renal function appears to be at baseline -Follow basic metabolic panel intermittently to assess renal function trend.  4-History of gross hematuria -Appears to be associated with recurrence of urothelial cancer now affecting her bladder. -Patient with Foley in place and receiving intermittent hand irrigation as  needed to guarantee good flow and appropriate obstruction from clots. -Urology is on board and there is anticipated intervention on 11/08/2017. -Following the recommendations she will be transferred to [facility].  5-Acute lower UTI -continue cipro as recommended by urology service -no fever and normal WBC's  6-Acute blood loss anemia -due to ongoing hematuria -follow urology rec's -continue to follow Hgb trend and transfuse as needed -VSS   DVT prophylaxis: SCD's Code Status: Full Family Communication: Husband over the phone. Disposition Plan: She will be transferred to the waistline on hospital for further evaluation, management and definitive treatment of her ongoing hematuria and bladder cancer.  Anticipating cystoscopy on 9/11 by Dr. Tresa Moore  Consultants:   Urology   Procedures:   None  Antimicrobials:  Anti-infectives (From admission, onward)   Start     Dose/Rate Route Frequency Ordered Stop   11/05/17 0045  ciprofloxacin (CIPRO) tablet 250 mg    Note to Pharmacy:  Starting 11/03/2017 x 5 days.     250 mg Oral 2 times daily 11/05/17 0036         Subjective: Afebrile, no chest pain, no nausea, no vomiting, no abdominal pain. Continue have hematuria. No overnight events reported. Hgb 7.9 this morning. VSS  Objective: Vitals:   11/06/17 0526 11/06/17 1002 11/06/17 1037 11/06/17 1038  BP: (!) 125/50 (!) 122/38 (!) 94/53 (!) 103/49  Pulse: (!) 59 78 72 72  Resp: 18 16 18    Temp: 98.2 F (36.8 C) 97.8 F (36.6 C) 98 F (36.7 C)   TempSrc:  Oral Oral   SpO2: 98% 100% 98%   Weight:      Height:        Intake/Output Summary (Last 24 hours) at 11/06/2017  [place] filed at 11/06/2017 1253 Gross per 24 hour  Intake 1110 ml  Output -  Net 1110 ml   Filed Weights   11/04/17 2053 11/05/17 0145 11/06/17 0500  Weight: 61.2 kg 54 kg 54 kg    Examination: General exam: Afebrile, alert, awake, oriented to person and place; pleasantly confuse. No agitation,  denies CP, SOB and palpitations. Pale on exam.  Respiratory system: Clear to auscultation. Respiratory effort normal. Cardiovascular system:RRR. No murmurs, rubs, gallops. No JVD Gastrointestinal system: Abdomen is nondistended, soft and nontender. No organomegaly or masses felt. Normal bowel sounds heard. Foley catheter in place (frank hematuria appreciated) Central nervous system: CN grossly intact. No focal neurological deficits. Extremities: No C/C/E, +pedal pulses Skin: No rashes, lesions or ulcers Psychiatry: Judgement and insight impaired secondary to dementia. No behavioral  Disturbances.    Data Reviewed: I have personally reviewed following labs and imaging studies  CBC: Recent Labs  Lab 11/04/17 2229 11/06/17 0425  WBC 14.4* 9.0  HGB 8.0* 7.9*  HCT 25.2* 24.7*  MCV 84.6 86.4  PLT 318 644   Basic Metabolic Panel: Recent Labs  Lab 11/04/17 2229 11/05/17 0555  NA 138 141  K 3.7 3.9  CL 107 108  CO2 23 22  GLUCOSE 130* 89  BUN 21 20  CREATININE 1.23* 1.26*  CALCIUM 7.9* 8.0*   GFR: Estimated Creatinine Clearance: 25.5 mL/min (A) (by C-G formula based on SCr of 1.26 mg/dL (H)).  Cardiac Enzymes: Recent Labs  Lab 11/04/17 2327  TROPONINI <0.03   CBG: Recent Labs  Lab 11/04/17 2302 11/05/17 0719 11/06/17 0446  GLUCAP 123* 86 85   Urine analysis:    Component Value Date/Time   COLORURINE RED (A) 11/05/2017 0010   APPEARANCEUR TURBID (A) 11/05/2017 0010   APPEARANCEUR Turbid (A) 10/16/2017 1544   LABSPEC  11/05/2017 0010    TEST NOT REPORTED DUE TO COLOR INTERFERENCE OF URINE PIGMENT   PHURINE  11/05/2017 0010    TEST NOT REPORTED DUE TO COLOR INTERFERENCE OF URINE PIGMENT   GLUCOSEU (A) 11/05/2017 0010    TEST NOT REPORTED DUE TO COLOR INTERFERENCE OF URINE PIGMENT   HGBUR (A) 11/05/2017 0010    TEST NOT REPORTED DUE TO COLOR INTERFERENCE OF URINE PIGMENT   BILIRUBINUR (A) 11/05/2017 0010    TEST NOT REPORTED DUE TO COLOR INTERFERENCE OF URINE  PIGMENT   BILIRUBINUR Negative 10/16/2017 1544   KETONESUR (A) 11/05/2017 0010    TEST NOT REPORTED DUE TO COLOR INTERFERENCE OF URINE PIGMENT   PROTEINUR (A) 11/05/2017 0010    TEST NOT REPORTED DUE TO COLOR INTERFERENCE OF URINE PIGMENT   UROBILINOGEN negative 03/09/2015 1734   NITRITE (A) 11/05/2017 0010    TEST NOT REPORTED DUE TO COLOR INTERFERENCE OF URINE PIGMENT   LEUKOCYTESUR (A) 11/05/2017 0010    TEST NOT REPORTED DUE TO COLOR INTERFERENCE OF URINE PIGMENT   LEUKOCYTESUR 3+ (A) 10/16/2017 1544    Recent Results (from the past 240 hour(s))  Culture, Urine     Status: None   Collection Time: 11/05/17 12:00 AM  Result Value Ref Range Status   Specimen Description   Final    URINE, CLEAN CATCH Performed at [facility], [address] [place], [place] [address]    Special Requests   Final    NONE Performed at [facility], [address] [place], [place] [address]    Culture   Final    NO GROWTH Performed at [facility],  [address] [place], [place] [address]    Report Status 11/06/2017 FINAL  Final     Scheduled Meds: . sodium chloride   Intravenous Once  . ciprofloxacin  250 mg Oral BID  . furosemide  30 mg Intravenous Once  . latanoprost  1 drop Both Eyes QHS  . sodium chloride flush  3 mL Intravenous Q12H  . vitamin B-12  1,000 mcg Oral Daily   Continuous Infusions:   LOS: 0 days    Time spent: 30 minutes    Barton Dubois, MD [facility] Pager [phone]  If 7PM-7AM, please contact night-coverage [email or web address] Password [email or web address] 11/06/2017, 1:38 PM

## 2017-11-07 LAB — TYPE AND SCREEN
ABO/RH(D): O POS
ABO/RH(D): O POS
ANTIBODY SCREEN: NEGATIVE
Antibody Screen: NEGATIVE
Unit division: 0
Unit division: 0
Unit division: 0

## 2017-11-07 LAB — GLUCOSE, CAPILLARY: Glucose-Capillary: 90 mg/dL (ref 70–99)

## 2017-11-07 LAB — BPAM RBC
BLOOD PRODUCT EXPIRATION DATE: 201910022359
Blood Product Expiration Date: 201910062359
Blood Product Expiration Date: 201910062359
ISSUE DATE / TIME: 201909081100
ISSUE DATE / TIME: 201909091015
ISSUE DATE / TIME: 201909091427
UNIT TYPE AND RH: 5100
UNIT TYPE AND RH: 5100
Unit Type and Rh: 5100

## 2017-11-07 NOTE — Progress Notes (Signed)
   Subjective/Chief Complaint:  1 - Right High Grade Ureteral Cancer + BLadder Cancer - s/p RIGHT robotic nephro-ureterectomy 10/2015 for pT2N0Mx high grade ureteral cancer. Primary near iliac crossing. Formal bladder cuff not performed, dissection taken to level of intramural ureter. Margins negative.   Post-op Surveillance:  - 04/2016 CMP, CXR, CT, Cysto - no recurrence Rt cancer, Cr 1.3, Left mass 2cm (slight progression); 11/2016 CMP, CT, Cysto - no recurrent Rt cancer, stable Lt 2cm mass  -04/2017 CMP, CXR, CT, - no recurrence Rt cancer, some distal Rt ureteral dilation noted, ==> Carcinoma In Situ by operative cysto / TURBT.  - 10/2017 CT - Rt trigone recurrence  2 - Gross Hematuria / Clot Retention -  catheter dependant since mid 09/2017 due to likely clot retention. UCX negative or coliforms sens to all but bactrim. CT with likely Rt lateral bladder cancer recurrence.    Today "Katie Huerta" is stable. Hgb 7.9, Cr 1.3, remains with large hematuria.      Objective: Vital signs in last 24 hours: Temp:  [97.8 F (36.6 C)-98.7 F (37.1 C)] 98 F (36.7 C) (09/10 0554) Pulse Rate:  [64-78] 66 (09/10 0554) Resp:  [16-18] 16 (09/10 0554) BP: (94-136)/(38-86) 126/63 (09/10 0554) SpO2:  [96 %-100 %] 97 % (09/10 0554) Weight:  [53.2 kg] 53.2 kg (09/10 0554) Last BM Date: 11/04/17  Intake/Output from previous day: 09/09 0701 - 09/10 0700 In: 1352 [P.O.:480; Blood:722] Out: 2100 [Urine:2100] Intake/Output this shift: No intake/output data recorded.  General appearance: alert, cooperative and pleasently demented as per recent baseline.  Eyes: negative Nose: Nares normal. Septum midline. Mucosa normal. No drainage or sinus tenderness. Throat: lips, mucosa, and tongue normal; teeth and gums normal Neck: supple, symmetrical, trachea midline Back: symmetric, no curvature. ROM normal. No CVA tenderness. Resp: non-labored on room air.  Cardio: Nl rate GI: soft, non-tender; bowel sounds  normal; no masses,  no organomegaly Pelvic: external genitalia normal and foley in place wtih dark bloody urine that is draining.  Skin: Skin color, texture, turgor normal. No rashes or lesions Lymph nodes: Cervical, supraclavicular, and axillary nodes normal.  Lab Results:  Recent Labs    11/04/17 2229 11/06/17 0425  WBC 14.4* 9.0  HGB 8.0* 7.9*  HCT 25.2* 24.7*  PLT 318 264   BMET Recent Labs    11/04/17 2229 11/05/17 0555  NA 138 141  K 3.7 3.9  CL 107 108  CO2 23 22  GLUCOSE 130* 89  BUN 21 20  CREATININE 1.23* 1.26*  CALCIUM 7.9* 8.0*   PT/INR No results for input(s): LABPROT, INR in the last 72 hours. ABG No results for input(s): PHART, HCO3 in the last 72 hours.  Invalid input(s): PCO2, PO2  Studies/Results: No results found.  Anti-infectives: Anti-infectives (From admission, onward)   Start     Dose/Rate Route Frequency Ordered Stop   11/05/17 0045  ciprofloxacin (CIPRO) tablet 250 mg    Note to Pharmacy:  Starting 11/03/2017 x 5 days.     250 mg Oral 2 times daily 11/05/17 0036        Assessment/Plan: Difficult situation with recurrent cancer with hemodynamically significant bleeding in demented elderly. Pt's family does not desire comfort care transition.   Plan for cysto and TURBT tomorrow afternoon with goal of debulking tumor and obtaining hemostasis. NPO p 5 AM tomorrow. Surgeyr likely around [employer].   [facility], Clerance Umland 11/07/2017

## 2017-11-07 NOTE — Progress Notes (Addendum)
PROGRESS NOTE    Katie Huerta  MGQ:[id number] DOB: 04-20-1927 DOA: 11/04/2017 PCP: [name], MD   Brief Narrative:  84 y.o.femalewith medical history significant forchronic kidney disease stage III, dementia, history of ureteral cancer status post nephroureterectomy in 2017, bladder biopsy positive for urothelial carcinoma in April, and recent gross hematuria, now presenting to the emergency department after syncopal episode. Patient has been following with urology for her urothelial carcinoma and has Foley catheter recently placed for obstruction, likely due to clots. She has been increasingly weak in general in recent days, has reportedly been lightheaded upon standing, and then had a brief syncopal episode on standing the night of admission. Patient has dementia that limits her ability to give a history, but she denies any chest pain or palpitations, focal weakness SOB or any other complaints. Found with acute blood loss anemia (hgb 8.0 down from 11.2). Ongoing gross hematuria appreciated. Assessment & Plan   Syncope -Suspect orthostatic from poor with ongoing hematuria/blood loss -Continue to treat underlying etiology  Acute blood loss anemia/symptomatic anemia -Presented with syncope, fatigue and weakness -Transfused 3 units PRBC since admission -Hemoglobin currently 7.9 -Continue to monitor CBC  Gross hematuria -Appears to be related to recurrence of urothelial cancer affecting her bladder -Patient has Foley catheter in place and currently receiving intermittent irrigation -Urology consulted and appreciated, plan for intervention on 11/08/2017  Urinary tract infection -Currently on ciprofloxacin as recommended by urology -Urine culture shows no growth  Chronic kidney disease, stage III -Creatinine appears to be at baseline, continue to monitor BMP  Dementia -Appears to be at baseline, continue supportive care  DVT Prophylaxis  SCDs  Code Status:  Full  Family Communication: None at bedside  Disposition Plan: Admitted.  Pending logical intervention on 11/08/2017  Consultants Urology   Procedures  None  Antibiotics   Anti-infectives (From admission, onward)   Start     Dose/Rate Route Frequency Ordered Stop   11/05/17 0045  ciprofloxacin (CIPRO) tablet 250 mg    Note to Pharmacy:  Starting 11/03/2017 x 5 days.     250 mg Oral 2 times daily 11/05/17 0036        Subjective:   Seward Meth seen and examined today.  Patient has no complaints this morning.  Denies current chest pain, shortness of breath, abdominal pain, nausea or vomiting, diarrhea or constipation.  Objective:   Vitals:   11/07/17 0141 11/07/17 0554 11/07/17 1048 11/07/17 1343  BP: 123/65 126/63 101/71 112/61  Pulse: 69 66 83 91  Resp: 18 16 18 16   Temp: 97.9 F (36.6 C) 98 F (36.7 C) 97.9 F (36.6 C) 99.3 F (37.4 C)  TempSrc: Oral Oral Oral Oral  SpO2: 98% 97% 100% 98%  Weight:  53.2 kg    Height:        Intake/Output Summary (Last 24 hours) at 11/07/2017 1511 Last data filed at 11/07/2017 1347 Gross per 24 hour  Intake 427 ml  Output 1575 ml  Net -1148 ml   Filed Weights   11/05/17 0145 11/06/17 0500 11/07/17 0554  Weight: 54 kg 54 kg 53.2 kg    Exam  General: Well developed, well nourished, NAD, appears stated age  HEENT: NCAT, mucous membranes moist.   Neck: Supple  Cardiovascular: S1 S2 auscultated, RRR, no murmur  Respiratory: Diminished breath sounds however clear, no wheezing.  Abdomen: Soft, nontender, nondistended, + bowel sounds  Extremities: warm dry without cyanosis clubbing or edema  Neuro: AAOx2 (self, place), nonfocal  Psych:  Normal affect and demeanor, pleasant   Data Reviewed: I have personally reviewed following labs and imaging studies  CBC: Recent Labs  Lab 11/04/17 2229 11/06/17 0425  WBC 14.4* 9.0  HGB 8.0* 7.9*  HCT 25.2* 24.7*  MCV 84.6 86.4  PLT 318 709   Basic Metabolic  Panel: Recent Labs  Lab 11/04/17 2229 11/05/17 0555  NA 138 141  K 3.7 3.9  CL 107 108  CO2 23 22  GLUCOSE 130* 89  BUN 21 20  CREATININE 1.23* 1.26*  CALCIUM 7.9* 8.0*   GFR: Estimated Creatinine Clearance: 25.5 mL/min (A) (by C-G formula based on SCr of 1.26 mg/dL (H)). Liver Function Tests: No results for input(s): AST, ALT, ALKPHOS, BILITOT, PROT, ALBUMIN in the last 168 hours. No results for input(s): LIPASE, AMYLASE in the last 168 hours. No results for input(s): AMMONIA in the last 168 hours. Coagulation Profile: No results for input(s): INR, PROTIME in the last 168 hours. Cardiac Enzymes: Recent Labs  Lab 11/04/17 2327  TROPONINI <0.03   BNP (last 3 results) No results for input(s): PROBNP in the last 8760 hours. HbA1C: No results for input(s): HGBA1C in the last 72 hours. CBG: Recent Labs  Lab 11/04/17 2302 11/05/17 0719 11/06/17 0446 11/07/17 0758  GLUCAP 123* 86 85 90   Lipid Profile: No results for input(s): CHOL, HDL, LDLCALC, TRIG, CHOLHDL, LDLDIRECT in the last 72 hours. Thyroid Function Tests: No results for input(s): TSH, T4TOTAL, FREET4, T3FREE, THYROIDAB in the last 72 hours. Anemia Panel: No results for input(s): VITAMINB12, FOLATE, FERRITIN, TIBC, IRON, RETICCTPCT in the last 72 hours. Urine analysis:    Component Value Date/Time   COLORURINE RED (A) 11/05/2017 0010   APPEARANCEUR TURBID (A) 11/05/2017 0010   APPEARANCEUR Turbid (A) 10/16/2017 1544   LABSPEC  11/05/2017 0010    TEST NOT REPORTED DUE TO COLOR INTERFERENCE OF URINE PIGMENT   PHURINE  11/05/2017 0010    TEST NOT REPORTED DUE TO COLOR INTERFERENCE OF URINE PIGMENT   GLUCOSEU (A) 11/05/2017 0010    TEST NOT REPORTED DUE TO COLOR INTERFERENCE OF URINE PIGMENT   HGBUR (A) 11/05/2017 0010    TEST NOT REPORTED DUE TO COLOR INTERFERENCE OF URINE PIGMENT   BILIRUBINUR (A) 11/05/2017 0010    TEST NOT REPORTED DUE TO COLOR INTERFERENCE OF URINE PIGMENT   BILIRUBINUR Negative  10/16/2017 1544   KETONESUR (A) 11/05/2017 0010    TEST NOT REPORTED DUE TO COLOR INTERFERENCE OF URINE PIGMENT   PROTEINUR (A) 11/05/2017 0010    TEST NOT REPORTED DUE TO COLOR INTERFERENCE OF URINE PIGMENT   UROBILINOGEN negative 03/09/2015 1734   NITRITE (A) 11/05/2017 0010    TEST NOT REPORTED DUE TO COLOR INTERFERENCE OF URINE PIGMENT   LEUKOCYTESUR (A) 11/05/2017 0010    TEST NOT REPORTED DUE TO COLOR INTERFERENCE OF URINE PIGMENT   LEUKOCYTESUR 3+ (A) 10/16/2017 1544   Sepsis Labs: @LABRCNTIP (procalcitonin:4,lacticidven:4)  ) Recent Results (from the past 240 hour(s))  Culture, Urine     Status: None   Collection Time: 11/05/17 12:00 AM  Result Value Ref Range Status   Specimen Description   Final    URINE, CLEAN CATCH Performed at [facility], [address], [place],  [place] [address]    Special Requests   Final    NONE Performed at [facility], [address], [place], [place] [address]    Culture   Final    NO GROWTH Performed at [facility], [place] [address],  [place], [place] [address]    Report Status 11/06/2017 FINAL  Final      Radiology Studies: No results found.   Scheduled Meds: . sodium chloride   Intravenous Once  . ciprofloxacin  250 mg Oral BID  . latanoprost  1 drop Both Eyes QHS  . sodium chloride flush  3 mL Intravenous Q12H  . vitamin B-12  1,000 mcg Oral Daily   Continuous Infusions:   LOS: 1 day   Time Spent in minutes   30 minutes  Consuello Lassalle D.O. on 11/07/2017 at 3:11 PM  Between 7am to 7pm - Please see pager noted on [email or web address]  After 7pm go to [email or web address]  And look for the night coverage person covering for me after hours  [facility] Office  [phone]

## 2017-11-07 NOTE — Progress Notes (Signed)
Physical Therapy Treatment Patient Details Name: Katie Huerta MRN: [id number] DOB: February 04, 1928 Today's Date: 11/07/2017    History of Present Illness 84 y.o. female with medical history significant for chronic kidney disease stage III, dementia, history of ureteral cancer status post nephroureterectomy in 2017, bladder biopsy positive for urothelial carcinoma in April, and recent gross hematuria. Admitted after having syncopal episode and fall at home. Currently presents with general weakness and gross hematuria.     PT Comments    Pt appears to be less steady today than she was last session. She participated and tolerated activity well. No family was present during the session. Per chart, pt is scheduled for urology procedure/surgery on tomorrow. Will continue to follow.     Follow Up Recommendations  Home health PT;Supervision/Assistance - 24 hour     Equipment Recommendations  None recommended by PT    Recommendations for Other Services       Precautions / Restrictions Precautions Precautions: Fall Restrictions Weight Bearing Restrictions: No    Mobility  Bed Mobility Overal bed mobility: Needs Assistance Bed Mobility: Supine to Sit     Supine to sit: Supervision     General bed mobility comments: for safety, lines  Transfers Overall transfer level: Needs assistance   Transfers: Sit to/from Stand Sit to Stand: Min assist         General transfer comment: 1 HHA to rise, steady.   Ambulation/Gait Ambulation/Gait assistance: Min assist Gait Distance (Feet): 125 Feet Assistive device: 1 person hand held assist Gait Pattern/deviations: Step-through pattern;Drifts right/left;Decreased stride length;Narrow base of support     General Gait Details: Assist to steady throughout distance. LOB x 1 requiring assist from therapist to prevent fall. Slow gait speed. Pt tolerated distance well.    Stairs             Wheelchair Mobility    Modified  [place] (Stroke Patients Only)       Balance Overall balance assessment: Needs assistance           Standing balance-Leahy Scale: Fair                              Cognition Arousal/Alertness: Awake/alert Behavior During Therapy: WFL for tasks assessed/performed Overall Cognitive Status: History of cognitive impairments - at baseline                                 General Comments: dementia      Exercises      General Comments        Pertinent Vitals/Pain Pain Assessment: No/denies pain    Home Living                      Prior Function            PT Goals (current goals can now be found in the care plan section) Progress towards PT goals: Progressing toward goals    Frequency    Min 3X/week      PT Plan      Co-evaluation              AM-PAC PT "6 Clicks" Daily Activity  Outcome Measure  Difficulty turning over in bed (including adjusting bedclothes, sheets and blankets)?: None Difficulty moving from lying on back to sitting on the side of the bed? : A Little Difficulty sitting down on  and standing up from a chair with arms (e.g., wheelchair, bedside commode, etc,.)?: A Little Help needed moving to and from a bed to chair (including a wheelchair)?: A Little Help needed walking in hospital room?: A Little Help needed climbing 3-5 steps with a railing? : A Little 6 Click Score: 19    End of Session Equipment Utilized During Treatment: Gait belt Activity Tolerance: Patient tolerated treatment well Patient left: in chair;with call bell/phone within reach;with chair alarm set   PT Visit Diagnosis: Unsteadiness on feet (R26.81);Difficulty in walking, not elsewhere classified (R26.2)     Time: 2585-2778 PT Time Calculation (min) (ACUTE ONLY): 12 min  Charges:  $Gait Training: 8-22 mins                        Weston Anna, MPT Pager: [phone]

## 2017-11-07 NOTE — Progress Notes (Signed)
Patient with only 250cc of urine out. Patient c/o having the urge to urinate. RN hand irrigated foley with 1000cc of NS, clots and bloody urine returned. Patient tolerated procedure well. After irrigation, 350cc of bloody urine was drained via foley catheter. Will continue to monitor urine output and hand irrigate PRN.   Jazaria Jarecki, Fraser Din 11/07/2017

## 2017-11-07 NOTE — Progress Notes (Signed)
Patient's family are agreeable to surgery by Dr. Tresa Moore. Consent signed and on the chart.   Katie Huerta, Fraser Din

## 2017-11-08 ENCOUNTER — Inpatient Hospital Stay (HOSPITAL_COMMUNITY): Payer: Medicare Other

## 2017-11-08 ENCOUNTER — Inpatient Hospital Stay (HOSPITAL_COMMUNITY): Payer: Medicare Other | Admitting: Certified Registered Nurse Anesthetist

## 2017-11-08 ENCOUNTER — Encounter (HOSPITAL_COMMUNITY)
Admission: EM | Disposition: A | Discharge status: Hospice | Payer: Self-pay | Source: Home / Self Care | Attending: Internal Medicine

## 2017-11-08 ENCOUNTER — Encounter (HOSPITAL_COMMUNITY): Payer: Self-pay | Admitting: *Deleted

## 2017-11-08 DIAGNOSIS — C679 Malignant neoplasm of bladder, unspecified: Secondary | ICD-10-CM | POA: Diagnosis present

## 2017-11-08 DIAGNOSIS — D62 Acute posthemorrhagic anemia: Secondary | ICD-10-CM

## 2017-11-08 DIAGNOSIS — R55 Syncope and collapse: Secondary | ICD-10-CM

## 2017-11-08 DIAGNOSIS — N390 Urinary tract infection, site not specified: Secondary | ICD-10-CM

## 2017-11-08 DIAGNOSIS — F0390 Unspecified dementia without behavioral disturbance: Secondary | ICD-10-CM

## 2017-11-08 HISTORY — PX: CYSTOSCOPY W/ URETERAL STENT PLACEMENT: SHX1429

## 2017-11-08 HISTORY — PX: TRANSURETHRAL RESECTION OF BLADDER TUMOR: SHX2575

## 2017-11-08 LAB — BASIC METABOLIC PANEL
Anion gap: 10 (ref 5–15)
BUN: 23 mg/dL (ref 8–23)
CALCIUM: 8.1 mg/dL — AB (ref 8.9–10.3)
CO2: 26 mmol/L (ref 22–32)
CREATININE: 1.17 mg/dL — AB (ref 0.44–1.00)
Chloride: 104 mmol/L (ref 98–111)
GFR calc Af Amer: 47 mL/min — ABNORMAL LOW (ref >60)
GFR, EST NON AFRICAN AMERICAN: 40 mL/min — AB (ref >60)
Glucose, Bld: 97 mg/dL (ref 70–99)
POTASSIUM: 3.6 mmol/L (ref 3.5–5.1)
SODIUM: 140 mmol/L (ref 135–145)

## 2017-11-08 LAB — CBC
HCT: 30.2 % — ABNORMAL LOW (ref 36.0–46.0)
Hemoglobin: 10 g/dL — ABNORMAL LOW (ref 12.0–15.0)
MCH: 28.2 pg (ref 26.0–34.0)
MCHC: 33.1 g/dL (ref 30.0–36.0)
MCV: 85.3 fL (ref 78.0–100.0)
PLATELETS: 288 10*3/uL (ref 150–400)
RBC: 3.54 MIL/uL — AB (ref 3.87–5.11)
RDW: 15.7 % — AB (ref 11.5–15.5)
WBC: 9.2 10*3/uL (ref 4.0–10.5)

## 2017-11-08 LAB — GLUCOSE, CAPILLARY: GLUCOSE-CAPILLARY: 92 mg/dL (ref 70–99)

## 2017-11-08 SURGERY — CYSTOSCOPY, WITH RETROGRADE PYELOGRAM AND URETERAL STENT INSERTION
Anesthesia: General

## 2017-11-08 MED ORDER — LACTATED RINGERS IV SOLN
INTRAVENOUS | Status: DC
  Administered 2017-11-08 – 2017-11-10 (×4): via INTRAVENOUS

## 2017-11-08 MED ORDER — EPHEDRINE SULFATE-NACL 50-0.9 MG/10ML-% IV SOSY
PREFILLED_SYRINGE | INTRAVENOUS | Status: DC | PRN
  Administered 2017-11-08: 5 mg via INTRAVENOUS

## 2017-11-08 MED ORDER — DEXAMETHASONE SODIUM PHOSPHATE 10 MG/ML IJ SOLN
INTRAMUSCULAR | Status: DC | PRN
  Administered 2017-11-08: 10 mg via INTRAVENOUS

## 2017-11-08 MED ORDER — FENTANYL CITRATE (PF) 100 MCG/2ML IJ SOLN
25.0000 ug | INTRAMUSCULAR | Status: DC | PRN
  Administered 2017-11-08: 50 ug via INTRAVENOUS
  Administered 2017-11-08 (×2): 25 ug via INTRAVENOUS

## 2017-11-08 MED ORDER — SODIUM CHLORIDE 0.9 % IR SOLN
3000.0000 mL | Status: DC
  Administered 2017-11-08 (×2): 3000 mL

## 2017-11-08 MED ORDER — LIDOCAINE 2% (20 MG/ML) 5 ML SYRINGE
INTRAMUSCULAR | Status: DC | PRN
  Administered 2017-11-08: 40 mg via INTRAVENOUS

## 2017-11-08 MED ORDER — PROPOFOL 10 MG/ML IV BOLUS
INTRAVENOUS | Status: DC | PRN
  Administered 2017-11-08: 50 mg via INTRAVENOUS

## 2017-11-08 MED ORDER — FENTANYL CITRATE (PF) 100 MCG/2ML IJ SOLN
INTRAMUSCULAR | Status: AC
  Administered 2017-11-08: 19:00:00
  Filled 2017-11-08: qty 2

## 2017-11-08 MED ORDER — FENTANYL CITRATE (PF) 100 MCG/2ML IJ SOLN
INTRAMUSCULAR | Status: AC
  Filled 2017-11-08: qty 2

## 2017-11-08 MED ORDER — FENTANYL CITRATE (PF) 100 MCG/2ML IJ SOLN
INTRAMUSCULAR | Status: DC | PRN
  Administered 2017-11-08: 50 ug via INTRAVENOUS

## 2017-11-08 MED ORDER — CIPROFLOXACIN IN D5W 400 MG/200ML IV SOLN
400.0000 mg | INTRAVENOUS | Status: AC
  Administered 2017-11-08: 400 mg via INTRAVENOUS
  Filled 2017-11-08: qty 200

## 2017-11-08 MED ORDER — PHENYLEPHRINE 40 MCG/ML (10ML) SYRINGE FOR IV PUSH (FOR BLOOD PRESSURE SUPPORT)
PREFILLED_SYRINGE | INTRAVENOUS | Status: DC | PRN
  Administered 2017-11-08: 80 ug via INTRAVENOUS

## 2017-11-08 MED ORDER — SODIUM CHLORIDE 0.9 % IR SOLN
Status: DC | PRN
  Administered 2017-11-08: 18000 mL via INTRAVESICAL

## 2017-11-08 MED ORDER — SODIUM CHLORIDE 0.9 % IV SOLN
INTRAVENOUS | Status: DC
  Administered 2017-11-08 (×2): via INTRAVENOUS

## 2017-11-08 MED ORDER — ONDANSETRON HCL 4 MG/2ML IJ SOLN
INTRAMUSCULAR | Status: DC | PRN
  Administered 2017-11-08: 4 mg via INTRAVENOUS

## 2017-11-08 SURGICAL SUPPLY — 23 items
BAG URINE DRAINAGE (UROLOGICAL SUPPLIES) ×2 IMPLANT
BAG URO CATCHER STRL LF (MISCELLANEOUS) ×3 IMPLANT
BASKET ZERO TIP NITINOL 2.4FR (BASKET) IMPLANT
BSKT STON RTRVL ZERO TP 2.4FR (BASKET)
CATH FOLEY 3WAY 30CC 24FR (CATHETERS) ×3
CATH INTERMIT  6FR 70CM (CATHETERS) ×2 IMPLANT
CATH URTH STD 24FR FL 3W 2 (CATHETERS) IMPLANT
CLOTH BEACON ORANGE TIMEOUT ST (SAFETY) ×3 IMPLANT
ELECT REM PT RETURN 15FT ADLT (MISCELLANEOUS) ×3 IMPLANT
EVACUATOR MICROVAS BLADDER (UROLOGICAL SUPPLIES) IMPLANT
GLOVE BIOGEL M STRL SZ7.5 (GLOVE) ×13 IMPLANT
GOWN STRL REUS W/TWL LRG LVL3 (GOWN DISPOSABLE) ×8 IMPLANT
GUIDEWIRE ANG ZIPWIRE 038X150 (WIRE) ×3 IMPLANT
GUIDEWIRE STR DUAL SENSOR (WIRE) IMPLANT
LOOP CUT BIPOLAR 24F LRG (ELECTROSURGICAL) ×2 IMPLANT
MANIFOLD NEPTUNE II (INSTRUMENTS) ×3 IMPLANT
PACK CYSTO (CUSTOM PROCEDURE TRAY) ×3 IMPLANT
SET ASPIRATION TUBING (TUBING) IMPLANT
SYRINGE IRR TOOMEY STRL 70CC (SYRINGE) ×2 IMPLANT
TRAY CATH 16FR W/PLASTIC CATH (SET/KITS/TRAYS/PACK) ×2 IMPLANT
TUBING CONNECTING 10 (TUBING) ×2 IMPLANT
TUBING CONNECTING 10' (TUBING) ×1
TUBING UROLOGY SET (TUBING) ×3 IMPLANT

## 2017-11-08 NOTE — Progress Notes (Signed)
FC irrigated again d/t low urine output and thick, red urine. FC was hand irrigated with 120 ml NS, returning red urine with again many small blood clots. Pt tolerated well and denied pain. Husband at bedside.

## 2017-11-08 NOTE — Brief Op Note (Signed)
11/04/2017 - 11/08/2017  5:14 PM  PATIENT:  Katie Huerta  84 y.o. female  PRE-OPERATIVE DIAGNOSIS:  BLADDER CANCER  POST-OPERATIVE DIAGNOSIS:  bladder cancer  PROCEDURE:  Procedure(s): CYSTOSCOPY, CLOT EVACUATION (N/A) TRANSURETHRAL RESECTION OF BLADDER TUMOR (TURBT) (N/A)  SURGEON:  Surgeon(s) and Role:    * Alexis Frock, MD - Primary  PHYSICIAN ASSISTANT:   ASSISTANTS: none   ANESTHESIA:   general  EBL:  23ml new blood, 300cc old clot   BLOOD ADMINISTERED:none  DRAINS: 1F 3 way foley to irrigation   LOCAL MEDICATIONS USED:  NONE  SPECIMEN:  Source of Specimen:  clot and bladder tumor  DISPOSITION OF SPECIMEN:  discard  COUNTS:  YES  TOURNIQUET:  * No tourniquets in log *  DICTATION: .Other Dictation: Dictation Number [id number]  PLAN OF CARE: Admit to inpatient   PATIENT DISPOSITION:  PACU - hemodynamically stable.   Delay start of Pharmacological VTE agent (>24hrs) due to surgical blood loss or risk of bleeding: yes

## 2017-11-08 NOTE — Anesthesia Postprocedure Evaluation (Signed)
Anesthesia Post Note  Patient: Katie Huerta  Procedure(s) Performed: CYSTOSCOPY, CLOT EVACUATION (N/A ) TRANSURETHRAL RESECTION OF BLADDER TUMOR (TURBT) (N/A )     Patient location during evaluation: PACU Anesthesia Type: General Level of consciousness: awake Pain management: pain level controlled Vital Signs Assessment: post-procedure vital signs reviewed and stable Respiratory status: spontaneous breathing Cardiovascular status: stable Postop Assessment: no apparent nausea or vomiting Anesthetic complications: no    Last Vitals:  Vitals:   11/08/17 1745 11/08/17 1800  BP: 123/62 (!) 123/56  Pulse: 74 68  Resp: 17 16  Temp:    SpO2: 99% 98%    Last Pain:  Vitals:   11/08/17 1800  TempSrc:   PainSc: 4                  Dahiana Kulak

## 2017-11-08 NOTE — Progress Notes (Signed)
FC irrigated again with 120 ml d/t pt feeling urge to void and pressure in bladder. Again, urine was very thick, dark red with many small blood clots returned. FC now draining red urine to drainage bag with no visible clots. Will continue to monitor.

## 2017-11-08 NOTE — Progress Notes (Signed)
Patient's FC only draining a small amount of thick, very dark red, bloody urine so far this shift. Pt denied pain or pressure to bladder however. FC irrigated with 180 ml NS, many clots and dark bloody urine returned. FC now draining lighter red, bloody urine to drainage bag. Will continue to monitor and hand irrigate PRN.

## 2017-11-08 NOTE — Anesthesia Procedure Notes (Signed)
Procedure Name: LMA Insertion Date/Time: 11/08/2017 4:30 PM Performed by: Mitzie Na, CRNA Pre-anesthesia Checklist: Patient identified, Emergency Drugs available, Suction available, Patient being monitored and Timeout performed Patient Re-evaluated:Patient Re-evaluated prior to induction Oxygen Delivery Method: Circle system utilized Preoxygenation: Pre-oxygenation with 100% oxygen Induction Type: IV induction Ventilation: Mask ventilation without difficulty LMA: LMA inserted LMA Size: 3.0 Number of attempts: 1 Placement Confirmation: positive ETCO2 and breath sounds checked- equal and bilateral Dental Injury: Teeth and Oropharynx as per pre-operative assessment

## 2017-11-08 NOTE — Progress Notes (Signed)
Assumed care of patient from previous day shift nurse at approximately at 1530. Patient is currently in surgery at this time.  Carmela Hurt, RN

## 2017-11-08 NOTE — Anesthesia Preprocedure Evaluation (Addendum)
Anesthesia Evaluation  Patient identified by MRN, date of birth, ID band Patient awake    Reviewed: Allergy & Precautions, NPO status , Patient's Chart, lab work & pertinent test results  Airway Mallampati: II  TM Distance: >3 FB Neck ROM: Full    Dental no notable dental hx. (+) Partial Upper   Pulmonary former smoker,    Pulmonary exam normal breath sounds clear to auscultation       Cardiovascular Exercise Tolerance: Good Normal cardiovascular exam Rhythm:Regular Rate:Normal  Echo 07/04/15 Left ventricle: The cavity size was normal. Wall thickness was   increased in a pattern of moderate to severe LVH. Systolic   function was normal. The estimated ejection fraction was in the   range of 60% to 65%. Wall motion was normal; there were no   regional wall motion abnormalities. Doppler parameters are   consistent with abnormal left ventricular relaxation (grade 1   diastolic dysfunction).   Neuro/Psych Dementia    GI/Hepatic negative GI ROS,   Endo/Other    Renal/GU CRF and Renal InsufficiencyRenal disease     Musculoskeletal   Abdominal   Peds  Hematology  (+) anemia ,   Anesthesia Other Findings   Reproductive/Obstetrics                            Lab Results  Component Value Date   CREATININE 1.17 (H) 11/08/2017   BUN 23 11/08/2017   NA 140 11/08/2017   K 3.6 11/08/2017   CL 104 11/08/2017   CO2 26 11/08/2017    Lab Results  Component Value Date   WBC 9.2 11/08/2017   HGB 10.0 (L) 11/08/2017   HCT 30.2 (L) 11/08/2017   MCV 85.3 11/08/2017   PLT 288 11/08/2017    Anesthesia Physical Anesthesia Plan  ASA: III  Anesthesia Plan: General   Post-op Pain Management:    Induction: Intravenous  PONV Risk Score and Plan: Treatment may vary due to age or medical condition  Airway Management Planned: LMA  Additional Equipment:   Intra-op Plan:   Post-operative Plan:    Informed Consent: I have reviewed the patients History and Physical, chart, labs and discussed the procedure including the risks, benefits and alternatives for the proposed anesthesia with the patient or authorized representative who has indicated his/her understanding and acceptance.   Dental advisory given  Plan Discussed with: CRNA  Anesthesia Plan Comments:         Anesthesia Quick Evaluation

## 2017-11-08 NOTE — Transfer of Care (Signed)
Immediate Anesthesia Transfer of Care Note  Patient: Katie Huerta  Procedure(s) Performed: CYSTOSCOPY, CLOT EVACUATION (N/A ) TRANSURETHRAL RESECTION OF BLADDER TUMOR (TURBT) (N/A )  Patient Location: PACU  Anesthesia Type:General  Level of Consciousness: drowsy and patient cooperative  Airway & Oxygen Therapy: Patient Spontanous Breathing and Patient connected to face mask oxygen  Post-op Assessment: Report given to RN and Post -op Vital signs reviewed and stable  Post vital signs: Reviewed and stable  Last Vitals:  Vitals Value Taken Time  BP 136/50 11/08/2017  5:33 PM  Temp    Pulse 79 11/08/2017  5:36 PM  Resp 17 11/08/2017  5:36 PM  SpO2 100 % 11/08/2017  5:36 PM  Vitals shown include unvalidated device data.  Last Pain:  Vitals:   11/08/17 1231  TempSrc: Oral  PainSc:       Patients Stated Pain Goal: 0 (88/28/00 3491)  Complications: No apparent anesthesia complications

## 2017-11-08 NOTE — Progress Notes (Signed)
Day of Surgery   Subjective/Chief Complaint:   1 - Right High Grade Ureteral Cancer + BLadder Cancer - s/p RIGHT robotic nephro-ureterectomy 10/2015 for pT2N0Mx high grade ureteral cancer. Primary near iliac crossing. Formal bladder cuff not performed, dissection taken to level of intramural ureter. Margins negative.   Post-op Surveillance:  - 04/2016 CMP, CXR, CT, Cysto - no recurrence Rt cancer, Cr 1.3, Left mass 2cm (slight progression); 11/2016 CMP, CT, Cysto - no recurrent Rt cancer, stable Lt 2cm mass  -04/2017 CMP, CXR, CT, - no recurrence Rt cancer, some distal Rt ureteral dilation noted, ==> Carcinoma In Situ by operative cysto / TURBT.  - 10/2017 CT - Rt trigone recurrence  2 - Gross Hematuria / Clot Retention -  catheter dependant since mid 09/2017 due to likely clot retention. UCX negative or coliforms sens to all but bactrim. CT with likely Rt lateral bladder cancer recurrence.    Today "[name]" is stable. Hgb 10,   Objective: Vital signs in last 24 hours: Temp:  [98 F (36.7 C)-98.5 F (36.9 C)] 98.3 F (36.8 C) (09/11 1231) Pulse Rate:  [63-79] 64 (09/11 1231) Resp:  [14-22] 14 (09/11 0556) BP: (96-125)/(57-72) 112/72 (09/11 1231) SpO2:  [95 %-98 %] 96 % (09/11 1231) Weight:  [52.5 kg] 52.5 kg (09/11 0556) Last BM Date: 11/04/17  Intake/Output from previous day: 09/10 0701 - 09/11 0700 In: 313 [P.O.:310; I.V.:3] Out: 950 [Urine:950] Intake/Output this shift: Total I/O In: -  Out: 400 [Urine:400]    General appearance: alert, cooperative and pleasently demented as per recent baseline. SOns and husband at bedside.  Eyes: negative Nose: Nares normal. Septum midline. Mucosa normal. No drainage or sinus tenderness. Throat: lips, mucosa, and tongue normal; teeth and gums normal Neck: supple, symmetrical, trachea midline Back: symmetric, no curvature. ROM normal. No CVA tenderness. Resp: non-labored on room air.  Cardio: Nl rate GI: soft, non-tender; bowel sounds  normal; no masses,  no organomegaly Pelvic: external genitalia normal and foley in place wtih dark bloody urine that is draining.  Skin: Skin color, texture, turgor normal. No rashes or lesions Lymph nodes: Cervical, supraclavicular, and axillary nodes normal.   Lab Results:  Recent Labs    11/06/17 0425 11/08/17 0535  WBC 9.0 9.2  HGB 7.9* 10.0*  HCT 24.7* 30.2*  PLT 264 288   BMET Recent Labs    11/08/17 0535  NA 140  K 3.6  CL 104  CO2 26  GLUCOSE 97  BUN 23  CREATININE 1.17*  CALCIUM 8.1*   PT/INR No results for input(s): LABPROT, INR in the last 72 hours. ABG No results for input(s): PHART, HCO3 in the last 72 hours.  Invalid input(s): PCO2, PO2  Studies/Results: No results found.  Anti-infectives: Anti-infectives (From admission, onward)   Start     Dose/Rate Route Frequency Ordered Stop   11/09/17 0600  ciprofloxacin (CIPRO) IVPB 400 mg     400 mg 200 mL/hr over 60 Minutes Intravenous On call to O.R. 11/08/17 1504 11/10/17 0559   11/05/17 0045  [MAR Hold]  ciprofloxacin (CIPRO) tablet 250 mg     (MAR Hold since Wed 11/08/2017 at 1443. Reason: Transfer to a Procedural area.)  Note to Pharmacy:  Starting 11/03/2017 x 5 days.     250 mg Oral 2 times daily 11/05/17 0036        Assessment/Plan:  Difficult situation with recurrent cancer with hemodynamically significant bleeding in demented elderly. Pt's family does not desire comfort care transition.   Plan  for cysto and TURBT as planned. RIsks, benefits, alternatives, expected perei-op course discussed previously and reiterated today to husband who is POA.   [facility], Krithika Tome 11/08/2017

## 2017-11-08 NOTE — Plan of Care (Addendum)
Pt on CBI. OP pink in color. FC had to be irrigated @ 0015 D/T clot. No issues after that.

## 2017-11-08 NOTE — Progress Notes (Signed)
PROGRESS NOTE    Katie Huerta  CZY:[id number] DOB: Jan 31, 1928 DOA: 11/04/2017 PCP: [name], MD  Brief Narrative:  84 y.o.femalewith medical history significant forchronic kidney disease stage III, dementia, history of ureteral cancer status post nephroureterectomy in 2017, bladder biopsy positive for urothelial carcinoma in April, and recent gross hematuria, now presenting to the emergency department after syncopal episode. Patient has been following with urology for her urothelial carcinoma and has Foley catheter recently placed for obstruction, likely due to clots. She has been increasingly weak in general in recent days, has reportedly been lightheaded upon standing, and then had abriefsyncopal episode on standingthenightof admission. Patient has dementia that limits her ability to give a history, but she denies any chest pain or palpitations,focal weakness SOB or any other complaints. Found with acute blood loss anemia (hgb 8.0 down from 11.2). Ongoing gross hematuria appreciated. Assessment & Plan:   Principal Problem:   Syncope Active Problems:   Dementia   Ureteral mass   Stage 3 chronic kidney disease (HCC)   History of gross hematuria   Acute lower UTI   Acute blood loss anemia   Gross hematuria   Hematuria   Urothelial cancer ([place])  1] gross hematuria secondary to recurrence of urothelial cancer affecting urinary bladder.  Patient has a Foley catheter in place draining blood-tinged urine.  Urology planning for intervention today.  Patient is n.p.o.  Start IV fluids.  2] syncope secondary to #1.  Along with poor p.o. Intake.  3] acute blood loss anemia secondary to urothelial blood loss.  Status post transfusion with 3 units of packed RBCs.  Hemoglobin today 10.0  4] UTI on ciprofloxacin per urology.  5] CKD stage III renal function stable.  6] dementia continue Aricept   DVT prophylaxis:SCD Code Status FULL Family Communication  NONE Disposition Plan pending urology work-up. Consultants: UROLOGY   Procedures:NONE Antimicrobials:  CIPRO Subjective:STILL WITH BLOOD IN URINE    Objective: Vitals:   11/07/17 1748 11/07/17 2151 11/08/17 0242 11/08/17 0556  BP: (!) 120/59 96/66 125/64 (!) 111/57  Pulse: 71 79 65 63  Resp: (!) 22 14 16 14   Temp: 98.4 F (36.9 C) 98.5 F (36.9 C) 98 F (36.7 C) 98.1 F (36.7 C)  TempSrc: Oral Oral Oral Oral  SpO2: 97% 97% 98% 95%  Weight:    52.5 kg  Height:        Intake/Output Summary (Last 24 hours) at 11/08/2017 1202 Last data filed at 11/08/2017 0646 Gross per 24 hour  Intake 310 ml  Output 850 ml  Net -540 ml   Filed Weights   11/06/17 0500 11/07/17 0554 11/08/17 0556  Weight: 54 kg 53.2 kg 52.5 kg    Examination: Frail elderly female resting in bed in no acute distress Oral mucosa dry General exam: Appears calm and comfortable  Respiratory system: Clear to auscultation. Respiratory effort normal. Cardiovascular system: S1 & S2 heard, RRR. No JVD, murmurs, rubs, gallops or clicks. No pedal edema. Gastrointestinal system: Abdomen is nondistended, soft and nontender. No organomegaly or masses felt. Normal bowel sounds heard. Central nervous system: Alert and oriented. No focal neurological deficits. Extremities: Symmetric 5 x 5 power. Skin: No rashes, lesions or ulcers Psychiatry: Judgement and insight appear normal. Mood & affect appropriate.     Data Reviewed: I have personally reviewed following labs and imaging studies  CBC: Recent Labs  Lab 11/04/17 2229 11/06/17 0425 11/08/17 0535  WBC 14.4* 9.0 9.2  HGB 8.0* 7.9* 10.0*  HCT 25.2*  24.7* 30.2*  MCV 84.6 86.4 85.3  PLT 318 264 329   Basic Metabolic Panel: Recent Labs  Lab 11/04/17 2229 11/05/17 0555 11/08/17 0535  NA 138 141 140  K 3.7 3.9 3.6  CL 107 108 104  CO2 23 22 26   GLUCOSE 130* 89 97  BUN 21 20 23   CREATININE 1.23* 1.26* 1.17*  CALCIUM 7.9* 8.0* 8.1*   GFR: Estimated  Creatinine Clearance: 27.5 mL/min (A) (by C-G formula based on SCr of 1.17 mg/dL (H)). Liver Function Tests: No results for input(s): AST, ALT, ALKPHOS, BILITOT, PROT, ALBUMIN in the last 168 hours. No results for input(s): LIPASE, AMYLASE in the last 168 hours. No results for input(s): AMMONIA in the last 168 hours. Coagulation Profile: No results for input(s): INR, PROTIME in the last 168 hours. Cardiac Enzymes: Recent Labs  Lab 11/04/17 2327  TROPONINI <0.03   BNP (last 3 results) No results for input(s): PROBNP in the last 8760 hours. HbA1C: No results for input(s): HGBA1C in the last 72 hours. CBG: Recent Labs  Lab 11/04/17 2302 11/05/17 0719 11/06/17 0446 11/07/17 0758 11/08/17 0719  GLUCAP 123* 86 85 90 92   Lipid Profile: No results for input(s): CHOL, HDL, LDLCALC, TRIG, CHOLHDL, LDLDIRECT in the last 72 hours. Thyroid Function Tests: No results for input(s): TSH, T4TOTAL, FREET4, T3FREE, THYROIDAB in the last 72 hours. Anemia Panel: No results for input(s): VITAMINB12, FOLATE, FERRITIN, TIBC, IRON, RETICCTPCT in the last 72 hours. Sepsis Labs: No results for input(s): PROCALCITON, LATICACIDVEN in the last 168 hours.  Recent Results (from the past 240 hour(s))  Culture, Urine     Status: None   Collection Time: 11/05/17 12:00 AM  Result Value Ref Range Status   Specimen Description   Final    URINE, CLEAN CATCH Performed at [facility], [address], [place], [place] [address]    Special Requests   Final    NONE Performed at [facility], [address], [place], [place] [address]    Culture   Final    NO GROWTH Performed at [facility], [place] [address] [place], [place] [address]    Report Status 11/06/2017 FINAL  Final         Radiology Studies: No results found.      Scheduled Meds: . sodium chloride   Intravenous Once  . ciprofloxacin  250 mg Oral BID  . latanoprost  1 drop Both Eyes QHS  . sodium chloride flush  3 mL  Intravenous Q12H  . vitamin B-12  1,000 mcg Oral Daily   Continuous Infusions: . sodium chloride 100 mL/hr at 11/08/17 0846     LOS: 2 days     Georgette Shell, MD [facility]  If 7PM-7AM, please contact night-coverage [email or web address] Password [facility] 11/08/2017, 12:02 PM

## 2017-11-09 ENCOUNTER — Encounter (HOSPITAL_COMMUNITY): Payer: Self-pay | Admitting: Urology

## 2017-11-09 DIAGNOSIS — R319 Hematuria, unspecified: Secondary | ICD-10-CM

## 2017-11-09 DIAGNOSIS — C679 Malignant neoplasm of bladder, unspecified: Principal | ICD-10-CM

## 2017-11-09 LAB — CBC
HCT: 26.3 % — ABNORMAL LOW (ref 36.0–46.0)
Hemoglobin: 8.5 g/dL — ABNORMAL LOW (ref 12.0–15.0)
MCH: 28 pg (ref 26.0–34.0)
MCHC: 32.3 g/dL (ref 30.0–36.0)
MCV: 86.5 fL (ref 78.0–100.0)
PLATELETS: 276 10*3/uL (ref 150–400)
RBC: 3.04 MIL/uL — ABNORMAL LOW (ref 3.87–5.11)
RDW: 15.8 % — ABNORMAL HIGH (ref 11.5–15.5)
WBC: 6.1 10*3/uL (ref 4.0–10.5)

## 2017-11-09 LAB — BASIC METABOLIC PANEL
Anion gap: 9 (ref 5–15)
BUN: 20 mg/dL (ref 8–23)
CALCIUM: 7.9 mg/dL — AB (ref 8.9–10.3)
CHLORIDE: 109 mmol/L (ref 98–111)
CO2: 23 mmol/L (ref 22–32)
CREATININE: 0.96 mg/dL (ref 0.44–1.00)
GFR calc Af Amer: 59 mL/min — ABNORMAL LOW (ref >60)
GFR, EST NON AFRICAN AMERICAN: 51 mL/min — AB (ref >60)
Glucose, Bld: 136 mg/dL — ABNORMAL HIGH (ref 70–99)
Potassium: 4.2 mmol/L (ref 3.5–5.1)
SODIUM: 141 mmol/L (ref 135–145)

## 2017-11-09 LAB — GLUCOSE, CAPILLARY: GLUCOSE-CAPILLARY: 117 mg/dL — AB (ref 70–99)

## 2017-11-09 NOTE — Progress Notes (Signed)
Physical Therapy Treatment Patient Details Name: Katie Huerta MRN: [id number] DOB: 01-22-1928 Today's Date: 11/09/2017    History of Present Illness 84 y.o. female with medical history significant for chronic kidney disease stage III, dementia, history of ureteral cancer status post nephroureterectomy in 2017, bladder biopsy positive for urothelial carcinoma in April, and recent gross hematuria. Admitted after having syncopal episode and fall at home. Currently presents with general weakness and gross hematuria.     PT Comments    Pt continues to participate well. Improved stability with rollator use. Pt has little to no short-term memory. No family present during session. Will continue to follow.    Follow Up Recommendations  Home health PT;Supervision/Assistance - 24 hour     Equipment Recommendations  None recommended by PT    Recommendations for Other Services       Precautions / Restrictions Precautions Precautions: Fall Restrictions Weight Bearing Restrictions: No    Mobility  Bed Mobility Overal bed mobility: Needs Assistance Bed Mobility: Supine to Sit     Supine to sit: Supervision     General bed mobility comments: for safety, lines  Transfers Overall transfer level: Needs assistance Equipment used: 4-wheeled walker Transfers: Sit to/from Stand Sit to Stand: Supervision         General transfer comment: VCs hand placement. Did not have pt operate breaks.   Ambulation/Gait Ambulation/Gait assistance: Supervision Gait Distance (Feet): 125 Feet Assistive device: 4-wheeled walker Gait Pattern/deviations: Step-through pattern     General Gait Details: Supervision for safety, lines. No LOB with rollator use. Pt tolerated distance well. Good gait speed.    Stairs             Wheelchair Mobility    Modified Rankin (Stroke Patients Only)       Balance                                            Cognition  Arousal/Alertness: Awake/alert Behavior During Therapy: WFL for tasks assessed/performed Overall Cognitive Status: History of cognitive impairments - at baseline                                 General Comments: dementia      Exercises      General Comments        Pertinent Vitals/Pain Pain Assessment: No/denies pain    Home Living                      Prior Function            PT Goals (current goals can now be found in the care plan section) Progress towards PT goals: Progressing toward goals    Frequency    Min 3X/week      PT Plan Current plan remains appropriate    Co-evaluation              AM-PAC PT "6 Clicks" Daily Activity  Outcome Measure  Difficulty turning over in bed (including adjusting bedclothes, sheets and blankets)?: None Difficulty moving from lying on back to sitting on the side of the bed? : None Difficulty sitting down on and standing up from a chair with arms (e.g., wheelchair, bedside commode, etc,.)?: A Little Help needed moving to and from a bed to chair (including a wheelchair)?: A  Little Help needed walking in hospital room?: A Little Help needed climbing 3-5 steps with a railing? : A Little 6 Click Score: 20    End of Session   Activity Tolerance: Patient tolerated treatment well Patient left: in chair;with call bell/phone within reach;with chair alarm set   PT Visit Diagnosis: Unsteadiness on feet (R26.81);Difficulty in walking, not elsewhere classified (R26.2)     Time: 2574-9355 PT Time Calculation (min) (ACUTE ONLY): 18 min  Charges:  $Gait Training: 8-22 mins                        Weston Anna, PT Acute Rehabilitation Services Pager: [phone] Office: [phone]

## 2017-11-09 NOTE — Care Management Note (Signed)
Case Management Note  Patient Details  Name: ANI DEOLIVEIRA MRN: [id number] Date of Birth: [birth year]  Subjective/Objective:  Spoke to patient/spouse in rm about HHC-they chose [facility] aware-they defer to son Leroy Sea c#[phone]. Recc HHRN/PT/aide. Continue to monitor for d/c plans.                  Action/Plan:d/c home w/HHC.   Expected Discharge Date:  11/06/17               Expected Discharge Plan:  [place]  In-House Referral:     Discharge planning Services  CM Consult  Post Acute Care Choice:    Choice offered to:  Patient, Spouse  DME Arranged:    DME Agency:     HH Arranged:  RN, PT, Nurse's Aide [place] Agency:  [facility]  Status of Service:  In process, will continue to follow  If discussed at Long Length of Stay Meetings, dates discussed:    Additional Comments:  Dessa Phi, RN 11/09/2017, 12:58 PM

## 2017-11-09 NOTE — Progress Notes (Signed)
1 Day Post-Op   Subjective/Chief Complaint:   1 - Right High Grade Ureteral Cancer + BLadder Cancer - s/p RIGHT robotic nephro-ureterectomy 10/2015 for pT2N0Mx high grade ureteral cancer. Primary near iliac crossing. Formal bladder cuff not performed, dissection taken to level of intramural ureter. Margins negative.   Post-op Surveillance:  - 04/2016 CMP, CXR, CT, Cysto - no recurrence Rt cancer, Cr 1.3, Left mass 2cm (slight progression); 11/2016 CMP, CT, Cysto - no recurrent Rt cancer, stable Lt 2cm mass  -04/2017 CMP, CXR, CT, - no recurrence Rt cancer, some distal Rt ureteral dilation noted, ==> Carcinoma In Situ by operative cysto / TURBT.  - 10/2017 CT - Rt trigone recurrence / TURBT / very large and rapid recurrence, tumor base 8cm+.  Today "[name]" is without compliants. Hematuria significantly improved after palliative resection yesterday, unfortunatley intra-op finidns show veyr rapid tumor progression.    Objective: Vital signs in last 24 hours: Temp:  [97.5 F (36.4 C)-98.3 F (36.8 C)] 97.5 F (36.4 C) (09/12 0439) Pulse Rate:  [64-100] 69 (09/12 0439) Resp:  [13-17] 16 (09/12 0439) BP: (99-136)/(50-72) 104/50 (09/12 0439) SpO2:  [95 %-100 %] 99 % (09/12 0439) Weight:  [55.5 kg] 55.5 kg (09/12 0439) Last BM Date: 11/04/17  Intake/Output from previous day: 09/11 0701 - 09/12 0700 In: 18670.4 [I.V.:1270.4; IV Piggyback:200] Out: 17400 [Urine:17350; Blood:50] Intake/Output this shift: Total I/O In: 2700 [Other:2700] Out: 2000 [Urine:2000]  General appearance: alert, cooperative and pleasently demented as per recent baseline. SOns and husband at bedside.  Eyes: negative Nose: Nares normal. Septum midline. Mucosa normal. No drainage or sinus tenderness. Throat: lips, mucosa, and tongue normal; teeth and gums normal Neck: supple, symmetrical, trachea midline Back: symmetric, no curvature. ROM normal. No CVA tenderness. Resp: non-labored on room air.  Cardio: Nl rate GI:  soft, non-tender; bowel sounds normal; no masses,  no organomegaly Pelvic: external genitalia normal and foley in place wtih yellow urine on very slow irrigation. Very improved. Irrigation turned off.  Skin: Skin color, texture, turgor normal. No rashes or lesions Lymph nodes: Cervical, supraclavicular, and axillary nodes normal.  Lab Results:  Recent Labs    11/08/17 0535 11/09/17 0454  WBC 9.2 6.1  HGB 10.0* 8.5*  HCT 30.2* 26.3*  PLT 288 276   BMET Recent Labs    11/08/17 0535 11/09/17 0454  NA 140 141  K 3.6 4.2  CL 104 109  CO2 26 23  GLUCOSE 97 136*  BUN 23 20  CREATININE 1.17* 0.96  CALCIUM 8.1* 7.9*   PT/INR No results for input(s): LABPROT, INR in the last 72 hours. ABG No results for input(s): PHART, HCO3 in the last 72 hours.  Invalid input(s): PCO2, PO2  Studies/Results: No results found.  Anti-infectives: Anti-infectives (From admission, onward)   Start     Dose/Rate Route Frequency Ordered Stop   11/09/17 0600  ciprofloxacin (CIPRO) IVPB 400 mg     400 mg 200 mL/hr over 60 Minutes Intravenous On call to O.R. 11/08/17 1504 11/08/17 1643   11/05/17 0045  ciprofloxacin (CIPRO) tablet 250 mg    Note to Pharmacy:  Starting 11/03/2017 x 5 days.     250 mg Oral 2 times daily 11/05/17 0036        Assessment/Plan:  Doing well after palliative TURBT. Much better hemostasis now achieved, but jsut a matter of time until bleeds again. She is not candidate for any major surgery. Frankly discussed with family / POA / sons overall liekly 25mo or less  life expectancy and rec hospice transition, hopefully home with home hospice as it is their goal to keep her at home as long as possible.    [facility], Emmamarie Kluender 11/09/2017

## 2017-11-09 NOTE — Op Note (Signed)
NAME: [name], [name]:[id number] ACCOUNT [id number] DATE OF BIRTH:1927/03/13 FACILITY: [facility] LOCATION: [facility] PHYSICIAN:Daniell Paradise, MD  OPERATIVE REPORT  DATE OF PROCEDURE:  11/08/2017  PREOPERATIVE DIAGNOSIS:  Recurrent large volume bladder cancer with clot retention and hematuria.  PROCEDURE: 1.  Cystoscopy, clot evacuation. 2.  Transection of bladder tumor, large.   ESTIMATED BLOOD LOSS:  Approximately 80 mL new blood and 300 mL of old clot.  FINDINGS: 1.  A very large and rapid recurrence of the right trigone and lateral wall bladder tumor.  Total surface area estimated at 8 cm sq. 2.  Massive form of clot in the urinary bladder.  Estimated 300 mL. 3.  Adequate hemostasis following resection of tumor and fulguration.  INDICATIONS:  Katie Huerta is an unfortunate 84 year old lady with history of significant dementia, functional decline, who has a long and protracted history of urothelial carcinoma, beginning first in her right kidney and now with recurrences in her bladder.   She is not a curative intent candidate.  She is not a chemotherapy candidate.  She has been managed for quite some time now with as needed resection of bladder tumor with goal of palliative intent.  She presented to an independent Emergency Room several  days ago and clot retention, likely from recurrent bladder tumor.  She was transferred here for further management.  Options were discussed for management with her family extensively including a purely palliative approach versus another transurethral  resection and clot evacuation with a goal of temporizing and they adamantly wished to proceed with the latter.  Informed consent was obtained and placed in medical record.  DESCRIPTION OF PROCEDURE:  The patient being Katie Huerta, right transection of bladder tumor, cystoscopy, clot evacuation confirmed.  Procedure timeout was performed.  Intravenous antibiotics administered.  General  anesthesia induced.  The patient  was placed into a low lithotomy position, sterile field was created prepping and draping to the patient's vagina, introitus and proximal thighs was prepped with iodine.  Cystourethroscopy was performed using a 26-French resectoscope sheath with visual obturator.  Inspection of the  bladder revealed a massive amount of formed clot within the bladder.  Next, using a Toomey syringe and approximately 4000 mL of irrigation and 60 mL aliquots, the clot was evacuated, it was approximately 300 mL in total and it had the consistency of  grape jelly.  Inspection with the clot removed revealed a very large and rapid recurrence of a papillary bladder tumor also some nodular component involving the right hemitrigone and lateral bladder wall.  Estimated surface area approximately 8 cm sq.   Using medium size resectoscope loop, very careful resection was performed in a bottom to top orientation, taking the tumor down to what appeared to be superficial fibromuscular stroma of the urinary bladder, taking exquisite care to avoid bladder  perforation which did not occur.  The bladder tumors were irrigated and set aside for discard as a pathologic analysis would not change her management.  The base of this resection area was very carefully fulgurated using maximum current coagulation  current in the loop which resulted in excellent hemostasis.  The resectoscope was exchanged for a 23-French 3-way Foley catheter, a 20 mL  balloon was inserted with normal saline irrigation, efflux was light pink.  This was placed on very gentle  traction and the procedure terminated.  The patient tolerated the procedure well.  No immediate complications.  The patient was taken to postanesthesia care in stable condition.  AN/NUANCE  D:11/08/2017 T:11/09/2017 JOB:[id number]

## 2017-11-09 NOTE — Progress Notes (Signed)
PROGRESS NOTE    Katie Huerta  YKZ:[id number] DOB: 03/07/1927 DOA: 11/04/2017 PCP: [name], MD   Brief Narrative: 84 y.o.femalewith medical history significant forchronic kidney disease stage III, dementia, history of ureteral cancer status post nephroureterectomy in 2017, bladder biopsy positive for urothelial carcinoma in April, and recent gross hematuria, now presenting to the emergency department after syncopal episode. Patient has been following with urology for her urothelial carcinoma and has Foley catheter recently placed for obstruction, likely due to clots. She has been increasingly weak in general in recent days, has reportedly been lightheaded upon standing, and then had abriefsyncopal episode on standingthenightof admission. Patient has dementia that limits her ability to give a history, but she denies any chest pain or palpitations,focal weakness SOB or any other complaints. Found with acute blood loss anemia (hgb 8.0 down from 11.2). Ongoing gross hematuria appreciated. Assessment & Plan:   Principal Problem:   Syncope Active Problems:   Dementia   Ureteral mass   Stage 3 chronic kidney disease (HCC)   History of gross hematuria   Acute lower UTI   Acute blood loss anemia   Gross hematuria   Hematuria   Urothelial cancer (HCC)   Bladder cancer (HCC)   1] gross hematuria secondary to right high-grade ureteral cancer and bladder cancer status post right robotic nephro ureterectomy for high-grade ureteral cancer.  Today Foley catheter in place draining almost clear urine.  Reviewed Dr. Zettie Pho notes.  Intraoperative findings consistent with very rapid tumor progression.  Overall life expectancy less than 6 months.  Will consult palliative care.  Patient in good spirits today.  Does not complain of any pain nausea vomiting or diarrhea.  She has history of dementia.  Patient admitted with syncope secondary to hematuria.  Renew ciprofloxacin for  UTI.  2] CKD her renal functions have been stable.  3] chronic anemia hemoglobin down to 8.5 postop.   DVT prophylaxis: SCD Code Status full code Family Communication: No family available in the room Disposition Plan: TBD  Consultants: Palliative care pending, urology  Procedures: Status post right robotic nephroureterectomy Antimicrobials: Ciprofloxacin  Subjective: Patient resting in bed denies any complaints at all  Objective: Very pleasant sweet young lady in no acute distress really hard of hearing. Vitals:   11/08/17 2019 11/09/17 0144 11/09/17 0439 11/09/17 1251  BP: (!) 132/59 (!) 99/55 (!) 104/50 (!) 107/43  Pulse: 100 78 69 67  Resp: 16 16 16    Temp: 97.9 F (36.6 C) 98.2 F (36.8 C) (!) 97.5 F (36.4 C) 98.1 F (36.7 C)  TempSrc: Oral Oral Oral Oral  SpO2: 100% 100% 99% 97%  Weight:   55.5 kg   Height:        Intake/Output Summary (Last 24 hours) at 11/09/2017 1423 Last data filed at 11/09/2017 1313 Gross per 24 hour  Intake 21370.42 ml  Output 20450 ml  Net 920.42 ml   Filed Weights   11/07/17 0554 11/08/17 0556 11/09/17 0439  Weight: 53.2 kg 52.5 kg 55.5 kg    Examination: Patient comfortable resting in bed Foley catheter draining almost clear urine.  General exam: Appears calm and comfortable  Respiratory system: Clear to auscultation. Respiratory effort normal. Cardiovascular system: S1 & S2 heard, RRR. No JVD, murmurs, rubs, gallops or clicks. No pedal edema. Gastrointestinal system: Abdomen is nondistended, soft and nontender. No organomegaly or masses felt. Normal bowel sounds heard. Central nervous system: Alert and oriented. No focal neurological deficits. Extremities: Symmetric 5 x 5 power. Skin:  No rashes, lesions or ulcers Psychiatry: Judgement and insight appear normal. Mood & affect appropriate.     Data Reviewed: I have personally reviewed following labs and imaging studies  CBC: Recent Labs  Lab 11/04/17 2229 11/06/17 0425  11/08/17 0535 11/09/17 0454  WBC 14.4* 9.0 9.2 6.1  HGB 8.0* 7.9* 10.0* 8.5*  HCT 25.2* 24.7* 30.2* 26.3*  MCV 84.6 86.4 85.3 86.5  PLT 318 264 288 122   Basic Metabolic Panel: Recent Labs  Lab 11/04/17 2229 11/05/17 0555 11/08/17 0535 11/09/17 0454  NA 138 141 140 141  K 3.7 3.9 3.6 4.2  CL 107 108 104 109  CO2 23 22 26 23   GLUCOSE 130* 89 97 136*  BUN 21 20 23 20   CREATININE 1.23* 1.26* 1.17* 0.96  CALCIUM 7.9* 8.0* 8.1* 7.9*   GFR: Estimated Creatinine Clearance: 33.5 mL/min (by C-G formula based on SCr of 0.96 mg/dL). Liver Function Tests: No results for input(s): AST, ALT, ALKPHOS, BILITOT, PROT, ALBUMIN in the last 168 hours. No results for input(s): LIPASE, AMYLASE in the last 168 hours. No results for input(s): AMMONIA in the last 168 hours. Coagulation Profile: No results for input(s): INR, PROTIME in the last 168 hours. Cardiac Enzymes: Recent Labs  Lab 11/04/17 2327  TROPONINI <0.03   BNP (last 3 results) No results for input(s): PROBNP in the last 8760 hours. HbA1C: No results for input(s): HGBA1C in the last 72 hours. CBG: Recent Labs  Lab 11/05/17 0719 11/06/17 0446 11/07/17 0758 11/08/17 0719 11/09/17 0516  GLUCAP 86 85 90 92 117*   Lipid Profile: No results for input(s): CHOL, HDL, LDLCALC, TRIG, CHOLHDL, LDLDIRECT in the last 72 hours. Thyroid Function Tests: No results for input(s): TSH, T4TOTAL, FREET4, T3FREE, THYROIDAB in the last 72 hours. Anemia Panel: No results for input(s): VITAMINB12, FOLATE, FERRITIN, TIBC, IRON, RETICCTPCT in the last 72 hours. Sepsis Labs: No results for input(s): PROCALCITON, LATICACIDVEN in the last 168 hours.  Recent Results (from the past 240 hour(s))  Culture, Urine     Status: None   Collection Time: 11/05/17 12:00 AM  Result Value Ref Range Status   Specimen Description   Final    URINE, CLEAN CATCH Performed at [facility], [address] [place], [place] [address]    Special Requests   Final     NONE Performed at [facility], [address], [place], [place] [address]    Culture   Final    NO GROWTH Performed at [facility], [place] [address], [place], [place] [address]    Report Status 11/06/2017 FINAL  Final         Radiology Studies: No results found.      Scheduled Meds: . sodium chloride   Intravenous Once  . ciprofloxacin  250 mg Oral BID  . latanoprost  1 drop Both Eyes QHS  . sodium chloride flush  3 mL Intravenous Q12H  . vitamin B-12  1,000 mcg Oral Daily   Continuous Infusions: . sodium chloride 100 mL/hr at 11/08/17 1843  . lactated ringers Stopped (11/08/17 1838)  . sodium chloride irrigation       LOS: 3 days    Georgette Shell, MD [facility]  If 7PM-7AM, please contact night-coverage [email or web address] Password [facility] 11/09/2017, 2:23 PM

## 2017-11-09 NOTE — Care Management Important Message (Signed)
Important Message  Patient Details  Name: Katie Huerta MRN: [id number] Date of Birth: [birth year]   Medicare Important Message Given:  Yes    Kerin Salen 11/09/2017, 12:39 [place] Message  Patient Details  Name: Katie Huerta MRN: [id number] Date of Birth: [birth year]   Medicare Important Message Given:  Yes    Kerin Salen 11/09/2017, 12:39 PM

## 2017-11-10 DIAGNOSIS — Z7189 Other specified counseling: Secondary | ICD-10-CM

## 2017-11-10 DIAGNOSIS — L8990 Pressure ulcer of unspecified site, unspecified stage: Secondary | ICD-10-CM

## 2017-11-10 DIAGNOSIS — Z515 Encounter for palliative care: Secondary | ICD-10-CM

## 2017-11-10 LAB — BASIC METABOLIC PANEL
Anion gap: 5 (ref 5–15)
BUN: 19 mg/dL (ref 8–23)
CALCIUM: 7.6 mg/dL — AB (ref 8.9–10.3)
CO2: 25 mmol/L (ref 22–32)
CREATININE: 1.09 mg/dL — AB (ref 0.44–1.00)
Chloride: 115 mmol/L — ABNORMAL HIGH (ref 98–111)
GFR calc Af Amer: 51 mL/min — ABNORMAL LOW (ref >60)
GFR, EST NON AFRICAN AMERICAN: 44 mL/min — AB (ref >60)
GLUCOSE: 91 mg/dL (ref 70–99)
Potassium: 3.9 mmol/L (ref 3.5–5.1)
Sodium: 145 mmol/L (ref 135–145)

## 2017-11-10 LAB — CBC
HEMATOCRIT: 24.2 % — AB (ref 36.0–46.0)
Hemoglobin: 7.7 g/dL — ABNORMAL LOW (ref 12.0–15.0)
MCH: 28.1 pg (ref 26.0–34.0)
MCHC: 31.8 g/dL (ref 30.0–36.0)
MCV: 88.3 fL (ref 78.0–100.0)
PLATELETS: 250 10*3/uL (ref 150–400)
RBC: 2.74 MIL/uL — ABNORMAL LOW (ref 3.87–5.11)
RDW: 16.2 % — AB (ref 11.5–15.5)
WBC: 6.6 10*3/uL (ref 4.0–10.5)

## 2017-11-10 LAB — GLUCOSE, CAPILLARY: Glucose-Capillary: 78 mg/dL (ref 70–99)

## 2017-11-10 NOTE — Progress Notes (Signed)
2 Days Post-Op   Subjective/Chief Complaint:    1 - Right High Grade Ureteral Cancer + BLadder Cancer - s/p RIGHT robotic nephro-ureterectomy 10/2015 for pT2N0Mx high grade ureteral cancer. Primary near iliac crossing. Formal bladder cuff not performed, dissection taken to level of intramural ureter. Margins negative.   Post-op Surveillance:  - 04/2016 CMP, CXR, CT, Cysto - no recurrence Rt cancer, Cr 1.3, Left mass 2cm (slight progression); 11/2016 CMP, CT, Cysto - no recurrent Rt cancer, stable Lt 2cm mass  -04/2017 CMP, CXR, CT, - no recurrence Rt cancer, some distal Rt ureteral dilation noted, ==> Carcinoma In Situ by operative cysto / TURBT.  - 10/2017 CT - Rt trigone recurrence / TURBT / very large and rapid recurrence, tumor base 8cm+.  2 - Disposition / End of Life Planning -pt with progressive renal and bladder cancer sicne 2017, likely now metastatic and with recurrent hematuria that at can at best be temporized. She is AO x 0-1 at baseline from her dementia.   Today "[name]" is stable. Urine now w/o significant gross blood.    Objective: Vital signs in last 24 hours: Temp:  [98 F (36.7 C)-98.1 F (36.7 C)] 98.1 F (36.7 C) (09/13 1345) Pulse Rate:  [58-68] 65 (09/13 1345) Resp:  [18] 18 (09/13 1345) BP: (112-140)/(47-60) 118/58 (09/13 1345) SpO2:  [97 %-98 %] 98 % (09/13 1345) Weight:  [58.1 kg] 58.1 kg (09/13 0459) Last BM Date: 11/04/17  Intake/Output from previous day: 09/12 0701 - 09/13 0700 In: 11876.6 [I.V.:3176.6] Out: 8650 [Urine:8650] Intake/Output this shift: Total I/O In: 477 [P.O.:477] Out: 450 [Urine:450]   General appearance: alert, cooperative and pleasently demented as per recent baseline. Husband at bedside.  Eyes: negative Nose: Nares normal. Septum midline. Mucosa normal. No drainage or sinus tenderness. Throat: lips, mucosa, and tongue normal; teeth and gums normal Neck: supple, symmetrical, trachea midline Back: symmetric, no curvature. ROM  normal. No CVA tenderness. Resp: non-labored on room air.  Cardio: Nl rate GI: soft, non-tender; bowel sounds normal; no masses,  no organomegaly Pelvic: external genitalia normal and foley in place wtih yellow urine off irrigation. Very improved. Skin: Skin color, texture, turgor normal. No rashes or lesions Lymph nodes: Cervical, supraclavicular, and axillary nodes normal.   Lab Results:  Recent Labs    11/09/17 0454 11/10/17 0443  WBC 6.1 6.6  HGB 8.5* 7.7*  HCT 26.3* 24.2*  PLT 276 250   BMET Recent Labs    11/09/17 0454 11/10/17 0443  NA 141 145  K 4.2 3.9  CL 109 115*  CO2 23 25  GLUCOSE 136* 91  BUN 20 19  CREATININE 0.96 1.09*  CALCIUM 7.9* 7.6*   PT/INR No results for input(s): LABPROT, INR in the last 72 hours. ABG No results for input(s): PHART, HCO3 in the last 72 hours.  Invalid input(s): PCO2, PO2  Studies/Results: No results found.  Anti-infectives: Anti-infectives (From admission, onward)   Start     Dose/Rate Route Frequency Ordered Stop   11/09/17 0600  ciprofloxacin (CIPRO) IVPB 400 mg     400 mg 200 mL/hr over 60 Minutes Intravenous On call to O.R. 11/08/17 1504 11/08/17 1643   11/05/17 0045  ciprofloxacin (CIPRO) tablet 250 mg    Note to Pharmacy:  Starting 11/03/2017 x 5 days.     250 mg Oral 2 times daily 11/05/17 0036        Assessment/Plan:   1 - Right High Grade Ureteral Cancer + BLadder Cancer - Prognosis extremely poor.  Now temporized with palliative intent. I do not feel further surgical intervention warranted with her functional decline. Foley removed today, strongly rec it NOT be re-insterted.   2 - Disposition / End of Life Planning -rec home with home hospice at this point. She has <26mo estimated life expectancy. This has been discussed with pt, sons, husband (POA) x many and they are amenable.   [facility], [place] 11/10/2017

## 2017-11-10 NOTE — Care Management Note (Signed)
Case Management Note  Patient Details  Name: Katie Huerta MRN: [id number] Date of Birth: [birth year]  Subjective/Objective: Per palliative-Dr. Domingo Cocking spoke to Dr. Tresa Moore, agreed to offer hospice services to patient/family-Dr. Domingo Cocking offered hospice services over phone to son Raymond Gurney agreed to [facility]-CM TC to agency-spoke to [place] fax palliative cons,H&P, face sheet to [phone].                    Action/Plan:d/c home w/hospice.   Expected Discharge Date:  11/06/17               Expected Discharge Plan:  [place]  In-House Referral:     Discharge planning Services  CM Consult  Post Acute Care Choice:    Choice offered to:  Patient, Spouse, Adult Children  DME Arranged:    DME Agency:     HH Arranged:    [place] Agency:  [facility]  Status of Service:  In process, will continue to follow  If discussed at Long Length of Stay Meetings, dates discussed:    Additional Comments:  Katie Phi, RN 11/10/2017, 4:09 PM

## 2017-11-10 NOTE — Progress Notes (Signed)
PROGRESS NOTE    Katie Huerta  PPJ:[id number] DOB: [birth year] DOA: 11/04/2017 PCP: [name], MD  Brief Narrative:84 y.o.femalewith medical history significant forchronic kidney disease stage III, dementia, history of ureteral cancer status post nephroureterectomy in 2017, bladder biopsy positive for urothelial carcinoma in April, and recent gross hematuria, now presenting to the emergency department after syncopal episode. Patient has been following with urology for her urothelial carcinoma and has Foley catheter recently placed for obstruction, likely due to clots. She has been increasingly weak in general in recent days, has reportedly been lightheaded upon standing, and then had abriefsyncopal episode on standingthenightof admission. Patient has dementia that limits her ability to give a history, but she denies any chest pain or palpitations,focal weakness SOB or any other complaints. Found with acute blood loss anemia (hgb 8.0 down from 11.2). Ongoing gross hematuria appreciated. Assessment & Plan:   Principal Problem:   Syncope Active Problems:   Dementia   Ureteral mass   Stage 3 chronic kidney disease (HCC)   History of gross hematuria   Acute lower UTI   Acute blood loss anemia   Gross hematuria   Hematuria   Urothelial cancer (HCC)   Bladder cancer (HCC)   Pressure injury of skin  1] Right high-grade ureteral cancer and bladder cancer status post right robotic nephro ureterectomy for high-grade ureteral cancer.    Appreciate care input.  Foley catheter removed today.  Plan is to DC her home tomorrow with hospice.  2] CKD her renal functions have been stable.  3] chronic anemia hemoglobin slowly dropping down     DVT prophylaxis scd Code Status:dnr Family Communication: dw husband Disposition Plan: Home with hospice tomorrow   Consultants: Palliative care, urology  Procedures: Right robotic  nephroureterectomy Antimicrobials: None Subjective: Patient resting in bed in no acute distress smiling sweet young lady in no acute distress husband by the bedside  Objective: Vitals:   11/09/17 1251 11/09/17 2033 11/10/17 0459 11/10/17 1345  BP: (!) 107/43 (!) 140/47 112/60 (!) 118/58  Pulse: 67 68 (!) 58 65  Resp:  18 18 18   Temp: 98.1 F (36.7 C) 98.1 F (36.7 C) 98 F (36.7 C) 98.1 F (36.7 C)  TempSrc: Oral Oral Oral Oral  SpO2: 97% 98% 97% 98%  Weight:   58.1 kg   Height:        Intake/Output Summary (Last 24 hours) at 11/10/2017 1725 Last data filed at 11/10/2017 1200 Gross per 24 hour  Intake 9653.6 ml  Output 5900 ml  Net 3753.6 ml   Filed Weights   11/08/17 0556 11/09/17 0439 11/10/17 0459  Weight: 52.5 kg 55.5 kg 58.1 kg    Examination:  General exam: Appears calm and comfortable  Respiratory system: Clear to auscultation. Respiratory effort normal. Cardiovascular system: S1 & S2 heard, RRR. No JVD, murmurs, rubs, gallops or clicks. No pedal edema. Gastrointestinal system: Abdomen is nondistended, soft and nontender. No organomegaly or masses felt. Normal bowel sounds heard. Central nervous system: Alert and oriented. No focal neurological deficits. Extremities: Symmetric 5 x 5 power. Skin: No rashes, lesions or ulcers    Data Reviewed: I have personally reviewed following labs and imaging studies  CBC: Recent Labs  Lab 11/04/17 2229 11/06/17 0425 11/08/17 0535 11/09/17 0454 11/10/17 0443  WBC 14.4* 9.0 9.2 6.1 6.6  HGB 8.0* 7.9* 10.0* 8.5* 7.7*  HCT 25.2* 24.7* 30.2* 26.3* 24.2*  MCV 84.6 86.4 85.3 86.5 88.3  PLT 318 264 288 276 250  Basic Metabolic Panel: Recent Labs  Lab 11/04/17 2229 11/05/17 0555 11/08/17 0535 11/09/17 0454 11/10/17 0443  NA 138 141 140 141 145  K 3.7 3.9 3.6 4.2 3.9  CL 107 108 104 109 115*  CO2 23 22 26 23 25   GLUCOSE 130* 89 97 136* 91  BUN 21 20 23 20 19   CREATININE 1.23* 1.26* 1.17* 0.96 1.09*  CALCIUM  7.9* 8.0* 8.1* 7.9* 7.6*   GFR: Estimated Creatinine Clearance: 29.5 mL/min (A) (by C-G formula based on SCr of 1.09 mg/dL (H)). Liver Function Tests: No results for input(s): AST, ALT, ALKPHOS, BILITOT, PROT, ALBUMIN in the last 168 hours. No results for input(s): LIPASE, AMYLASE in the last 168 hours. No results for input(s): AMMONIA in the last 168 hours. Coagulation Profile: No results for input(s): INR, PROTIME in the last 168 hours. Cardiac Enzymes: Recent Labs  Lab 11/04/17 2327  TROPONINI <0.03   BNP (last 3 results) No results for input(s): PROBNP in the last 8760 hours. HbA1C: No results for input(s): HGBA1C in the last 72 hours. CBG: Recent Labs  Lab 11/06/17 0446 11/07/17 0758 11/08/17 0719 11/09/17 0516 11/10/17 0457  GLUCAP 85 90 92 117* 78   Lipid Profile: No results for input(s): CHOL, HDL, LDLCALC, TRIG, CHOLHDL, LDLDIRECT in the last 72 hours. Thyroid Function Tests: No results for input(s): TSH, T4TOTAL, FREET4, T3FREE, THYROIDAB in the last 72 hours. Anemia Panel: No results for input(s): VITAMINB12, FOLATE, FERRITIN, TIBC, IRON, RETICCTPCT in the last 72 hours. Sepsis Labs: No results for input(s): PROCALCITON, LATICACIDVEN in the last 168 hours.  Recent Results (from the past 240 hour(s))  Culture, Urine     Status: None   Collection Time: 11/05/17 12:00 AM  Result Value Ref Range Status   Specimen Description   Final    URINE, CLEAN CATCH Performed at [facility], [address], [place], [place] [address]    Special Requests   Final    NONE Performed at [facility], [address] [place], [place] [address]    Culture   Final    NO GROWTH Performed at [facility], [place] [address], [place], [place] [address]    Report Status 11/06/2017 FINAL  Final         Radiology Studies: No results found.      Scheduled Meds: . sodium chloride   Intravenous Once  . ciprofloxacin  250 mg Oral BID  . latanoprost  1 drop Both Eyes  QHS  . sodium chloride flush  3 mL Intravenous Q12H  . vitamin B-12  1,000 mcg Oral Daily   Continuous Infusions: . sodium chloride 100 mL/hr at 11/08/17 1843  . lactated ringers 75 mL/hr at 11/10/17 1037  . sodium chloride irrigation       LOS: 4 days    Georgette Shell, MD [facility] If 7PM-7AM, please contact night-coverage [email or web address] Password [email or web address] 11/10/2017, 5:25 PM

## 2017-11-10 NOTE — Plan of Care (Signed)
Pt's CBI is functioning well.

## 2017-11-10 NOTE — Progress Notes (Signed)
Physical Therapy Treatment Patient Details Name: Katie Huerta MRN: [id number] DOB: 1927-11-25 Today's Date: 11/10/2017    History of Present Illness 84 y.o. female with medical history significant for chronic kidney disease stage III, dementia, history of ureteral cancer status post nephroureterectomy in 2017, bladder biopsy positive for urothelial carcinoma in April, and recent gross hematuria. Admitted after having syncopal episode and fall at home. Currently presents with general weakness and gross hematuria.     PT Comments    Pt agreeable to participate. She c/o feeling weak on today. She declined hallway ambulation. No family present during session. Will continue to follow.    Follow Up Recommendations  Home health PT;Supervision/Assistance - 24 hour     Equipment Recommendations  None recommended by PT    Recommendations for Other Services       Precautions / Restrictions Precautions Precautions: Fall Restrictions Weight Bearing Restrictions: No    Mobility  Bed Mobility Overal bed mobility: Needs Assistance Bed Mobility: Supine to Sit     Supine to sit: Supervision     General bed mobility comments: for safety, lines  Transfers Overall transfer level: Needs assistance Equipment used: 4-wheeled walker Transfers: Sit to/from Stand Sit to Stand: Min assist         General transfer comment: small amount of assist to rise, steady on today. pt c/o feeling weak.   Ambulation/Gait Ambulation/Gait assistance: Min guard Gait Distance (Feet): 15 Feet Assistive device: 4-wheeled walker Gait Pattern/deviations: Step-through pattern     General Gait Details: close guard for safety. Only walked in room today at pt's request. She c/o feeling weak.    Stairs             Wheelchair Mobility    Modified Rankin (Stroke Patients Only)       Balance Overall balance assessment: Needs assistance           Standing balance-Leahy Scale: Fair                               Cognition Arousal/Alertness: Awake/alert Behavior During Therapy: WFL for tasks assessed/performed Overall Cognitive Status: History of cognitive impairments - at baseline                                 General Comments: dementia      Exercises      General Comments        Pertinent Vitals/Pain Pain Assessment: No/denies pain    Home Living                      Prior Function            PT Goals (current goals can now be found in the care plan section) Progress towards PT goals: Progressing toward goals    Frequency    Min 3X/week      PT Plan Current plan remains appropriate    Co-evaluation              AM-PAC PT "6 Clicks" Daily Activity  Outcome Measure  Difficulty turning over in bed (including adjusting bedclothes, sheets and blankets)?: None Difficulty moving from lying on back to sitting on the side of the bed? : None Difficulty sitting down on and standing up from a chair with arms (e.g., wheelchair, bedside commode, etc,.)?: A Little Help needed moving to and from  a bed to chair (including a wheelchair)?: A Little Help needed walking in hospital room?: A Little Help needed climbing 3-5 steps with a railing? : A Little 6 Click Score: 20    End of Session Equipment Utilized During Treatment: Gait belt Activity Tolerance: Patient limited by fatigue Patient left: in chair;with call bell/phone within reach;with chair alarm set   PT Visit Diagnosis: Unsteadiness on feet (R26.81);Difficulty in walking, not elsewhere classified (R26.2)     Time: 1020-1030 PT Time Calculation (min) (ACUTE ONLY): 10 min  Charges:  $Gait Training: 8-22 mins                        Weston Anna, PT Acute Rehabilitation Services Pager: [phone] Office: [phone]

## 2017-11-10 NOTE — Progress Notes (Signed)
Palliative cons-await recc.

## 2017-11-10 NOTE — Consult Note (Signed)
Consultation Note Date: 11/10/2017   Patient Name: Katie Huerta  DOB: August 13, 1927  MRN: [id number]  Age / Sex: 84 y.o., female  PCP: [name], MD Referring Physician: Georgette Shell, MD  Reason for Consultation: Establishing goals of care  HPI/Patient Profile: 84 y.o. female  with past medical history of CKD III, dementia, ureteral cancer, bladder cancer admitted on 11/04/2017 with gross hematuria and is now s/p TURBT.  Palliative is consulted for [place].  Clinical Assessment and Goals of Care: I met today with patient and her husband.  She is pleasantly demented.  I introduced palliative care as specialized medical care for people living with serious illness. It focuses on providing relief from the symptoms and stress of a serious illness. The goal is to improve quality of life for both the patient and the family.  Her husband reports that they are working on plan to take her home and the goals is to be at home, stay at home, and die at home.  They rely heavily on her son and he asks me to call and discuss with him.    I called and discussed with son, Geraldo Docker.  We discussed clinical course as well as wishes moving forward in regard to advanced directives.  Concepts specific to code status and rehospitalization discussed.  We discussed difference between a aggressive medical intervention path and a palliative, comfort focused care path.  Values and goals of care important to patient and family were attempted to be elicited.  Son reports that they have been discussing as family.  They have been discussing with Dr. Tresa Moore and feel that home with hospice is best option.  We discussed hospice benefits and offered choice per medicare guidelines.  Family would like to work with [facility].  We also discussed that in light of multiple chronic medical problems that have worsened with  this acute problem, care should be focused on interventions that are likely to allow the patient to achieve goal of getting back to home and spending time with family. I discussed with son and husband regarding heroic interventions at the end-of-life and they agree this would not be in line with prior expressed wishes for a natural death or be likely to lead to getting well enough to go back home. They were in agreement with changing CODE STATUS to DO NOT RESUSCITATE.  Son reports that they have completed DNR paperwork in the past.  Concept of Hospice and Palliative Care were discussed  Questions and concerns addressed.   PMT will continue to support holistically.  SUMMARY OF RECOMMENDATIONS   - DNR/DNI- Completed durable DNR on the chart - Plan for home with hospice support.  Appreciate Care management assistance in arranging. - Likely d/c home with hospice tomorrow.  Code Status/Advance Care Planning:  DNR   Symptom Management:   Currently denies symptoms. On discharge, would recommend scripts for: - Morphine Concentrate '10mg'$ /0.70m: '5mg'$  (0.245m sublingual every 1 hour as needed for pain or shortness of breath: Disp 3043m  Lorazepam '2mg'$ /ml concentrated solution: '1mg'$  (0.42m) sublingual every 4 hours as needed for anxiety: Disp 314m- Haldol '2mg'$ /ml solution: 0.'5mg'$  (0.2551msublingual every 4 hours as needed for agitation or nausea: Disp 56m47mPalliative Prophylaxis:   Bowel Regimen, Delirium Protocol and Frequent Pain Assessment  Additional Recommendations (Limitations, Scope, Preferences):  Avoid Hospitalization and Full Comfort Care  Psycho-social/Spiritual:   Desire for further Chaplaincy support:no  Additional Recommendations: Education on Hospice  Prognosis:   < 6 months- End stage cancer with plan for no further treatment.  She has been having significant bleeding that has been temporarily stabilized.  This will recur and goal is to be home and comfortable.  Her  prognosis if her disease follows its natural course is less than 6 months and she should qualify for hospice support on discharge.  Discharge Planning: Home with Hospice      Primary Diagnoses: Present on Admission: . Syncope . Dementia . Stage 3 chronic kidney disease (HCC)DermaHistory of gross hematuria . Acute lower UTI . Hematuria . Bladder cancer (HCC)RussellI have reviewed the medical record, interviewed the patient and family, and examined the patient. The following aspects are pertinent.  Past Medical History:  Diagnosis Date  . Bradycardia   . CKD (chronic kidney disease), stage III (HCC)[place]. Dementia   . Dysuria   . Hematuria   . History of cancer of ureter urologist-  dr mannTresa Moorex 07/ 2017--  s/p  right nephroureterectomy 11-18-2015, high grade urothelial carcinoma right ureter (pT2 N0 Mx)  . History of palpitations   . History of sepsis    07-13-2015  urosepsis  . History of syncope    2013--  per documentation in epic by cardiologist-- dr hochrein -  negative work-up, felt to be vagal episode  . HOH (hard of hearing)    pt want wear wearing aids  . Left renal mass    small solid noted on ct 05/ 2017  . Macular degeneration of both eyes    treatment w/ drops and injection's   Social History   Socioeconomic History  . Marital status: Married    Spouse name: Not on file  . Number of children: 2  . Years of education: Not on file  . Highest education level: Not on file  Occupational History  . Occupation: retired  SociScientific laboratory technicianFinancial resource strain: Not hard at all  . Food insecurity:    Worry: Never true    Inability: Never true  . Transportation needs:    Medical: No    Non-medical: No  Tobacco Use  . Smoking status: Former Smoker    Years: 10.00    Types: Cigarettes    Last attempt to quit: 10/27/1955    Years since quitting: 62.0  . Smokeless tobacco: Never Used  Substance and Sexual Activity  . Alcohol use: No  . Drug use: No  .  Sexual activity: Yes  Lifestyle  . Physical activity:    Days per week: 0 days    Minutes per session: 0 min  . Stress: To some extent  Relationships  . Social connections:    Talks on phone: More than three times a week    Gets together: More than three times a week    Attends religious service: More than 4 times per year    Active member of club or organization: No    Attends meetings of clubs or organizations: Never  Relationship status: Married  Other Topics Concern  . Not on file  Social History Narrative   Lives at home with husband.   She works at a [employer].   Family History  Problem Relation Age of Onset  . Coronary artery disease Father 22       Died 25s of an MI  . Heart attack Father   . Coronary artery disease Brother 55  . Heart attack Brother   . Coronary artery disease Sister 59  . Heart attack Brother   . Heart attack Brother    Scheduled Meds: . sodium chloride   Intravenous Once  . ciprofloxacin  250 mg Oral BID  . latanoprost  1 drop Both Eyes QHS  . sodium chloride flush  3 mL Intravenous Q12H  . vitamin B-12  1,000 mcg Oral Daily   Continuous Infusions: . sodium chloride 100 mL/hr at 11/08/17 1843  . lactated ringers 75 mL/hr at 11/10/17 1037  . sodium chloride irrigation     PRN Meds:.acetaminophen **OR** acetaminophen, bisacodyl, HYDROcodone-acetaminophen, ondansetron **OR** ondansetron (ZOFRAN) IV, senna-docusate Medications Prior to Admission:  Prior to Admission medications   Medication Sig Start Date End Date Taking? Authorizing Provider  aspirin EC 81 MG tablet Take 81 mg by mouth daily.   Yes [provider]  ciprofloxacin (CIPRO) 250 MG tablet Take 1 tablet by mouth 2 (two) times daily. Starting 11/03/2017 x 5 days. 11/03/17  Yes [provider]  latanoprost (XALATAN) 0.005 % ophthalmic solution Place 1 drop into both eyes at bedtime.  09/17/12  Yes [provider]  Multiple  Vitamins-Minerals (ICAPS MV PO) Take 1 tablet by mouth daily.   Yes [provider]  vitamin B-12 (CYANOCOBALAMIN) 1000 MCG tablet Take 1,000 mcg by mouth daily.   Yes [provider]   Allergies  Allergen Reactions  . Penicillins     Unknown childhood reaction   Review of Systems Denies needs.  Pleasantly demented  Physical Exam  General: Alert, awake, in no acute distress. Pleasantly confused HEENT: No bruits, no goiter, no JVD Heart: Regular rate and rhythm. No murmur appreciated. Lungs: Fair air movement, clear Abdomen: Soft, nontender, nondistended, positive bowel sounds.  Ext: No significant edema Skin: Warm and dry Neuro: Grossly intact, nonfocal.  Vital Signs: BP (!) 118/58 (BP Location: Left Arm)   Pulse 65   Temp 98.1 F (36.7 C) (Oral)   Resp 18   Ht '5\' 3"'$  (1.6 m)   Wt 58.1 kg   SpO2 98%   BMI 22.67 kg/m  Pain Scale: 0-10   Pain Score: 0-No pain   SpO2: SpO2: 98 % O2 Device:SpO2: 98 % O2 Flow Rate: .O2 Flow Rate (L/min): 2 L/min  IO: Intake/output summary:   Intake/Output Summary (Last 24 hours) at 11/10/2017 1613 Last data filed at 11/10/2017 1200 Gross per 24 hour  Intake 9653.6 ml  Output 6500 ml  Net 3153.6 ml    LBM: Last BM Date: 11/04/17 Baseline Weight: Weight: 61.2 kg Most recent weight: Weight: 58.1 kg     Palliative Assessment/Data:   Flowsheet Rows     Most Recent Value  Intake Tab  Referral Department  Hospitalist  Unit at Time of Referral  Oncology Unit  Palliative Care Primary Diagnosis  Cancer  Date Notified  11/09/17  Palliative Care Type  New Palliative care  Reason for referral  Clarify Goals of Care, End of Life Care Assistance  Date of Admission  11/04/17  Date first  seen by Palliative Care  11/10/17  # of days Palliative referral response time  1 Day(s)  # of days IP prior to Palliative referral  5  Clinical Assessment  Palliative Performance Scale Score  30%  Psychosocial & Spiritual Assessment    Palliative Care Outcomes  Patient/Family meeting held?  Yes  Who was at the meeting?  Husband, son via phone       Time Total: 75 minutes Greater than 50%  of this time was spent counseling and coordinating care related to the above assessment and plan.  Signed by: Micheline Rough, MD   Please contact Palliative Medicine Team phone at [phone] for questions and concerns.  For individual provider: See Shea Evans

## 2017-11-10 NOTE — Progress Notes (Signed)
Pt CBG 78 @ 0500. Pt given 1 OJ w 1 pk sugar.

## 2017-11-11 DIAGNOSIS — Z87448 Personal history of other diseases of urinary system: Secondary | ICD-10-CM

## 2017-11-11 MED ORDER — ONDANSETRON HCL 4 MG PO TABS: 4.0000 mg | ORAL_TABLET | Freq: Four times a day (QID) | ORAL | 0 refills | Status: AC | PRN

## 2017-11-11 NOTE — Discharge Instructions (Signed)
Indwelling Urinary Catheter Care, Adult  Take good care of your catheter to keep it working and to prevent problems.  How to wear your catheter  Attach your catheter to your leg with tape (adhesive tape) or a leg strap. Make sure it is not too tight. If you use tape, remove any bits of tape that are already on the catheter.  How to wear a drainage bag  You should have:   A large overnight bag.   A small leg bag.    Overnight Bag  You may wear the overnight bag at any time. Always keep the bag below the level of your bladder but off the floor. When you sleep, put a clean plastic bag in a wastebasket. Then hang the bag inside the wastebasket.  Leg Bag  Never wear the leg bag at night. Always wear the leg bag below your knee. Keep the leg bag secure with a leg strap or tape.  How to care for your skin   Clean the skin around the catheter at least once every day.   Shower every day. Do not take baths.   Put creams, lotions, or ointments on your genital area only as told by your doctor.   Do not use powders, sprays, or lotions on your genital area.  How to clean your catheter and your skin  1. Wash your hands with soap and water.  2. Wet a washcloth in warm water and gentle (mild) soap.  3. Use the washcloth to clean the skin where the catheter enters your body. Clean downward and wipe away from the catheter in small circles. Do not wipe toward the catheter.  4. Pat the area dry with a clean towel. Make sure to clean off all soap.  How to care for your drainage bags  Empty your drainage bag when it is ?- full or at least 2-3 times a day. Replace your drainage bag once a month or sooner if it starts to smell bad or look dirty. Do not clean your drainage bag unless told by your doctor.  Emptying a drainage bag    Supplies Needed   Rubbing alcohol.   Gauze pad or cotton ball.   Tape or a leg strap.    Steps  1. Wash your hands with soap and water.  2. Separate (detach) the bag from your leg.  3. Hold the bag over  the toilet or a clean container. Keep the bag below your hips and bladder. This stops pee (urine) from going back into the tube.  4. Open the pour spout at the bottom of the bag.  5. Empty the pee into the toilet or container. Do not let the pour spout touch any surface.  6. Put rubbing alcohol on a gauze pad or cotton ball.  7. Use the gauze pad or cotton ball to clean the pour spout.  8. Close the pour spout.  9. Attach the bag to your leg with tape or a leg strap.  10. Wash your hands.    Changing a drainage bag  Supplies Needed   Alcohol wipes.   A clean drainage bag.   Adhesive tape or a leg strap.    Steps  1. Wash your hands with soap and water.  2. Separate the dirty bag from your leg.  3. Pinch the rubber catheter with your fingers so that pee does not spill out.  4. Separate the catheter tube from the drainage tube where these tubes connect (at the   connection valve). Do not let the tubes touch any surface.  5. Clean the end of the catheter tube with an alcohol wipe. Use a different alcohol wipe to clean the end of the drainage tube.  6. Connect the catheter tube to the drainage tube of the clean bag.  7. Attach the new bag to the leg with adhesive tape or a leg strap.  8. Wash your hands.    How to prevent infection and other problems   Never pull on your catheter or try to remove it. Pulling can damage tissue in your body.   Always wash your hands before and after touching your catheter.   If a leg strap gets wet, replace it with a dry one.   Drink enough fluids to keep your pee clear or pale yellow, or as told by your doctor.   Do not let the drainage bag or tubing touch the floor.   Wear cotton underwear.   If you are female, wipe from front to back after you poop (have a bowel movement).   Check on the catheter often to make sure it works and the tubing is not twisted.  Get help if:   Your pee is cloudy.   Your pee smells unusually bad.   Your pee is not draining into the bag.   Your  tube gets clogged.   Your catheter starts to leak.   Your bladder feels full.  Get help right away if:   You have redness, swelling, or pain where the catheter enters your body.   You have fluid, pus, or a bad smell coming from the area where the catheter enters your body.   The area where the catheter enters your body feels warm.   You have a fever.   You have pain in your:  ? Stomach (abdomen).  ? Legs.  ? Lower back.  ? Bladder.   You see blood fill the catheter.   Your pee is pink or red.   You feel sick to your stomach (nauseous).   You throw up (vomit).   You have chills.   Your catheter gets pulled out.  This information is not intended to replace advice given to you by your health care provider. Make sure you discuss any questions you have with your health care provider.  Document Released: 06/11/2012 Document Revised: 01/13/2016 Document Reviewed: 07/30/2013  Elsevier Interactive Patient Education  2018 [employer]

## 2017-11-11 NOTE — Care Management Note (Signed)
Case Management Note  Patient Details  Name: Katie Huerta MRN: [id number] Date of Birth: Dec 08, 1927  Subjective/Objective:  Bladder cancer, Ureteral Cancer                  Action/Plan: Received call back from [place], requested dc summary be faxed to [phone]. Hospice Oncall RN will contact son, Geraldo Docker to arrange initial visit. Faxed dc summary to [place].   Expected Discharge Date:  11/11/17               Expected Discharge Plan:  [place]  In-House Referral:  NA  Discharge planning Services  CM Consult  Post Acute Care Choice:  Hospice Choice offered to:  Patient, Spouse, Adult Children  DME Arranged:  N/A DME Agency:  NA  HH Arranged:  RN [place] Agency:  [facility]  Status of Service:  Completed, signed off  If discussed at [employer], dates discussed:    Additional Comments:  Erenest Rasher, RN 11/11/2017, 2:43 PM

## 2017-11-11 NOTE — Progress Notes (Signed)
Daily Progress Note   Patient Name: Katie Huerta       Date: 11/11/2017 DOB: 11-15-1927  Age: 84 y.o. MRN#: [id number] Attending Physician: Georgette Shell, MD Primary Care Physician: [name], MD Admit Date: 11/04/2017  Reason for Consultation/Follow-up: Disposition and Establishing goals of care  Subjective: Saw and examined Ms. Celli this AM for symptom check prior to d/c home with hospice support.  Denies complaints.  Reports that she is excited to be going home.  Length of Stay: 5  Current Medications: Scheduled Meds:  . sodium chloride   Intravenous Once  . ciprofloxacin  250 mg Oral BID  . latanoprost  1 drop Both Eyes QHS  . sodium chloride flush  3 mL Intravenous Q12H  . vitamin B-12  1,000 mcg Oral Daily    Continuous Infusions: . sodium chloride Stopped (11/10/17 1035)  . lactated ringers 75 mL/hr at 11/11/17 0637  . sodium chloride irrigation      PRN Meds: acetaminophen **OR** acetaminophen, bisacodyl, HYDROcodone-acetaminophen, ondansetron **OR** ondansetron (ZOFRAN) IV, senna-docusate  Physical Exam         General: Alert, awake, in no acute distress. Pleasantly confused HEENT: No bruits, no goiter, no JVD Heart: Regular rate and rhythm. No murmur appreciated. Lungs: Fair air movement, clear Abdomen: Soft, nontender, nondistended, positive bowel sounds.  Ext: No significant edema Skin: Warm and dry Neuro: Grossly intact, nonfocal.  Vital Signs: BP 132/65 (BP Location: Right Arm)   Pulse 64   Temp 98.7 F (37.1 C) (Oral)   Resp 18   Ht 5\' 3"  (1.6 m)   Wt 58.1 kg   SpO2 98%   BMI 22.67 kg/m  SpO2: SpO2: 98 % O2 Device: O2 Device: Room Air O2 Flow Rate: O2 Flow Rate (L/min): 2 L/min  Intake/output summary:   Intake/Output  Summary (Last 24 hours) at 11/11/2017 1050 Last data filed at 11/11/2017 5364 Gross per 24 hour  Intake 1825.53 ml  Output 2450 ml  Net -624.47 ml   LBM: Last BM Date: 11/04/17 Baseline Weight: Weight: 61.2 kg Most recent weight: Weight: 58.1 kg       Palliative Assessment/Data:    Flowsheet Rows     Most Recent Value  Intake Tab  Referral Department  Hospitalist  Unit at Time of Referral  Oncology Unit  Palliative Care Primary Diagnosis  Cancer  Date Notified  11/09/17  Palliative Care Type  New Palliative care  Reason for referral  Clarify Goals of Care, End of Life Care Assistance  Date of Admission  11/04/17  Date first seen by Palliative Care  11/10/17  # of days Palliative referral response time  1 Day(s)  # of days IP prior to Palliative referral  5  Clinical Assessment  Palliative Performance Scale Score  30%  Psychosocial & Spiritual Assessment  Palliative Care Outcomes  Patient/Family meeting held?  Yes  Who was at the meeting?  Husband, son via phone      Patient Active Problem List   Diagnosis Date Noted  . Pressure injury of skin 11/10/2017  . Bladder cancer ([place]) 11/08/2017  . Hematuria 11/06/2017  . Urothelial cancer ([place])   . History of gross hematuria 11/05/2017  . Acute lower UTI 11/05/2017  . Acute blood loss anemia   . Gross hematuria   . Stage 3 chronic kidney disease ([place]) 11/12/2016  . Ureteral mass 09/23/2015  . Sepsis due to urinary tract infection ([place]) 07/01/2015  . ARF (acute renal failure) ([place]) 06/30/2015  . Chronic anemia 06/30/2015  . Vitamin B 12 deficiency 05/21/2015  . Dementia 03/25/2015  . Hearing decreased 03/25/2015  . Syncope 10/27/2011    Palliative Care Assessment & Plan   Patient Profile: 84 y.o. female  with past medical history of CKD III, dementia, ureteral cancer, bladder cancer admitted on 11/04/2017 with gross hematuria and is now s/p TURBT.  Palliative is consulted for [place].  Assessment: Patient Active  Problem List   Diagnosis Date Noted  . Pressure injury of skin 11/10/2017  . Bladder cancer ([place]) 11/08/2017  . Hematuria 11/06/2017  . Urothelial cancer ([place])   . History of gross hematuria 11/05/2017  . Acute lower UTI 11/05/2017  . Acute blood loss anemia   . Gross hematuria   . Stage 3 chronic kidney disease ([place]) 11/12/2016  . Ureteral mass 09/23/2015  . Sepsis due to urinary tract infection ([place]) 07/01/2015  . ARF (acute renal failure) ([place]) 06/30/2015  . Chronic anemia 06/30/2015  . Vitamin B 12 deficiency 05/21/2015  . Dementia 03/25/2015  . Hearing decreased 03/25/2015  . Syncope 10/27/2011   Recommendations/Plan: - DNR/DNI- Completed durable DNR on the chart - Plan for home with hospice support.  Appreciate Care management assistance in arranging.  Goals of Care and Additional Recommendations:  Limitations on Scope of Treatment: Avoid Hospitalization  Code Status:    Code Status Orders  (From admission, onward)         Start     Ordered   11/10/17 1725  Do not attempt resuscitation (DNR)  Continuous    Question Answer Comment  In the event of cardiac or respiratory ARREST Do not call a "code blue"   In the event of cardiac or respiratory ARREST Do not perform Intubation, CPR, defibrillation or ACLS   In the event of cardiac or respiratory ARREST Use medication by any route, position, wound care, and other measures to relive pain and suffering. May use oxygen, suction and manual treatment of airway obstruction as needed for comfort.      11/10/17 1724        Code Status History    Date Active Date Inactive Code Status Order ID Comments User Context   11/05/2017 0036 11/10/2017 1724 Full Code [id number]  Vianne Bulls, MD ED   11/18/2015 1717 11/20/2015 2137 Full Code [id number]  Lattie Corns Inpatient   07/01/2015 0000 07/05/2015 1719 DNR [id number]  Rise Patience, MD ED       Prognosis:   < 6 months  Discharge Planning:  Home with  Hospice  Care plan was discussed with patient, bedside RN  Thank you for allowing the Palliative Medicine Team to assist in the care of this patient.   Total Time 20 Prolonged Time Billed No      Greater than 50%  of this time was spent counseling and coordinating care related to the above assessment and plan.  Micheline Rough, MD  Please contact Palliative Medicine Team phone at [phone] for questions and concerns.

## 2017-11-11 NOTE — Progress Notes (Signed)
NCM spoke to son, Geraldo Docker, Attempted call to [facility] [phone], left message for oncall RN to give call back.  Jonnie Finner RN CCM Case Mgmt phone [phone]

## 2017-11-11 NOTE — Discharge Summary (Addendum)
Physician Discharge Summary  Katie Huerta TIR:[id number] DOB: March 26, 1927 DOA: 11/04/2017  PCP: [name], MD  Admit date: 11/04/2017 Discharge date: 11/11/2017  Admitted From: Home Disposition: Home with [place] none Equipment/Devices none  Discharge Condition: Hospice discharge CODE STATUS:DO Not resuscitate Diet recommendation: Regular Brief/Interim Summary:84 y.o.femalewith medical history significant forchronic kidney disease stage III, dementia, history of ureteral cancer status post nephroureterectomy in 2017, bladder biopsy positive for urothelial carcinoma in April, and recent gross hematuria, now presenting to the emergency department after syncopal episode. Patient has been following with urology for her urothelial carcinoma and has Foley catheter recently placed for obstruction, likely due to clots. She has been increasingly weak in general in recent days, has reportedly been lightheaded upon standing, and then had abriefsyncopal episode on standingthenightof admission. Patient has dementia that limits her ability to give a history, but she denies any chest pain or palpitations,focal weakness SOB or any other complaints. Found with acute blood loss anemia (hgb 8.0 down from 11.2). Ongoing gross hematuria appreciated  Discharge Diagnoses:  Principal Problem:   Syncope Active Problems:   Dementia   Ureteral mass   Stage 3 chronic kidney disease (HCC)   History of gross hematuria   Acute lower UTI   Acute blood loss anemia   Gross hematuria   Hematuria   Urothelial cancer (HCC)   Bladder cancer (HCC)   Pressure injury of skin  1] high-grade ureteral cancer and bladder cancer status post right robotic nephroureterectomy with ongoing gross hematuria and anemia-patient being discharged home today with hospice.  Patient still has a Foley catheter in place draining blood-tinged urine.  Continue Foley care at home.  Discharge  Instructions  Discharge Instructions    Diet - low sodium heart healthy   Complete by:  As directed    Increase activity slowly   Complete by:  As directed      Allergies as of 11/11/2017      Reactions   Penicillins    Unknown childhood reaction      Medication List    STOP taking these medications   aspirin EC 81 MG tablet   ciprofloxacin 250 MG tablet Commonly known as:  CIPRO   ICAPS MV PO   vitamin B-12 1000 MCG tablet Commonly known as:  CYANOCOBALAMIN     TAKE these medications   latanoprost 0.005 % ophthalmic solution Commonly known as:  XALATAN Place 1 drop into both eyes at bedtime.   ondansetron 4 MG tablet Commonly known as:  ZOFRAN Take 1 tablet (4 mg total) by mouth every 6 (six) hours as needed for nausea.       Allergies  Allergen Reactions  . Penicillins     Unknown childhood reaction    Consultations: Urology, palliative care  Procedures/Studies:  No results found. (Echo, Carotid, EGD, Colonoscopy, ERCP)    Subjective:   Discharge Exam: Vitals:   11/10/17 2017 11/11/17 0637  BP: (!) 155/55 132/65  Pulse: 65 64  Resp: 18 18  Temp: 98.7 F (37.1 C) 98.7 F (37.1 C)  SpO2: 99% 98%   Vitals:   11/10/17 0459 11/10/17 1345 11/10/17 2017 11/11/17 0637  BP: 112/60 (!) 118/58 (!) 155/55 132/65  Pulse: (!) 58 65 65 64  Resp: 18 18 18 18   Temp: 98 F (36.7 C) 98.1 F (36.7 C) 98.7 F (37.1 C) 98.7 F (37.1 C)  TempSrc: Oral Oral  Oral  SpO2: 97% 98% 99% 98%  Weight: 58.1 kg  Height:        General: Pt is alert, awake, not in acute distress Cardiovascular: RRR, S1/S2 +, no rubs, no gallops Respiratory: CTA bilaterally, no wheezing, no rhonchi Abdominal: Soft, NT, ND, bowel sounds + Extremities: no edema, no cyanosis    The results of significant diagnostics from this hospitalization (including imaging, microbiology, ancillary and laboratory) are listed below for reference.     Microbiology: Recent Results (from  the past 240 hour(s))  Culture, Urine     Status: None   Collection Time: 11/05/17 12:00 AM  Result Value Ref Range Status   Specimen Description   Final    URINE, CLEAN CATCH Performed at [facility], [address] [place], [place] [address]    Special Requests   Final    NONE Performed at [facility], [address] [place], [place] [address]    Culture   Final    NO GROWTH Performed at [facility], [place] [address] [place], [place] [address]    Report Status 11/06/2017 FINAL  Final     Labs: BNP (last 3 results) No results for input(s): BNP in the last 8760 hours. Basic Metabolic Panel: Recent Labs  Lab 11/04/17 2229 11/05/17 0555 11/08/17 0535 11/09/17 0454 11/10/17 0443  NA 138 141 140 141 145  K 3.7 3.9 3.6 4.2 3.9  CL 107 108 104 109 115*  CO2 23 22 26 23 25   GLUCOSE 130* 89 97 136* 91  BUN 21 20 23 20 19   CREATININE 1.23* 1.26* 1.17* 0.96 1.09*  CALCIUM 7.9* 8.0* 8.1* 7.9* 7.6*   Liver Function Tests: No results for input(s): AST, ALT, ALKPHOS, BILITOT, PROT, ALBUMIN in the last 168 hours. No results for input(s): LIPASE, AMYLASE in the last 168 hours. No results for input(s): AMMONIA in the last 168 hours. CBC: Recent Labs  Lab 11/04/17 2229 11/06/17 0425 11/08/17 0535 11/09/17 0454 11/10/17 0443  WBC 14.4* 9.0 9.2 6.1 6.6  HGB 8.0* 7.9* 10.0* 8.5* 7.7*  HCT 25.2* 24.7* 30.2* 26.3* 24.2*  MCV 84.6 86.4 85.3 86.5 88.3  PLT 318 264 288 276 250   Cardiac Enzymes: Recent Labs  Lab 11/04/17 2327  TROPONINI <0.03   BNP: Invalid input(s): POCBNP CBG: Recent Labs  Lab 11/06/17 0446 11/07/17 0758 11/08/17 0719 11/09/17 0516 11/10/17 0457  GLUCAP 85 90 92 117* 78   D-Dimer No results for input(s): DDIMER in the last 72 hours. Hgb A1c No results for input(s): HGBA1C in the last 72 hours. Lipid Profile No results for input(s): CHOL, HDL, LDLCALC, TRIG, CHOLHDL, LDLDIRECT in the last 72 hours. Thyroid function studies No results  for input(s): TSH, T4TOTAL, T3FREE, THYROIDAB in the last 72 hours.  Invalid input(s): FREET3 Anemia work up No results for input(s): VITAMINB12, FOLATE, FERRITIN, TIBC, IRON, RETICCTPCT in the last 72 hours. Urinalysis    Component Value Date/Time   COLORURINE RED (A) 11/05/2017 0010   APPEARANCEUR TURBID (A) 11/05/2017 0010   APPEARANCEUR Turbid (A) 10/16/2017 1544   LABSPEC  11/05/2017 0010    TEST NOT REPORTED DUE TO COLOR INTERFERENCE OF URINE PIGMENT   PHURINE  11/05/2017 0010    TEST NOT REPORTED DUE TO COLOR INTERFERENCE OF URINE PIGMENT   GLUCOSEU (A) 11/05/2017 0010    TEST NOT REPORTED DUE TO COLOR INTERFERENCE OF URINE PIGMENT   HGBUR (A) 11/05/2017 0010    TEST NOT REPORTED DUE TO COLOR INTERFERENCE OF URINE PIGMENT   BILIRUBINUR (A) 11/05/2017 0010  TEST NOT REPORTED DUE TO COLOR INTERFERENCE OF URINE PIGMENT   BILIRUBINUR Negative 10/16/2017 1544   KETONESUR (A) 11/05/2017 0010    TEST NOT REPORTED DUE TO COLOR INTERFERENCE OF URINE PIGMENT   PROTEINUR (A) 11/05/2017 0010    TEST NOT REPORTED DUE TO COLOR INTERFERENCE OF URINE PIGMENT   UROBILINOGEN negative 03/09/2015 1734   NITRITE (A) 11/05/2017 0010    TEST NOT REPORTED DUE TO COLOR INTERFERENCE OF URINE PIGMENT   LEUKOCYTESUR (A) 11/05/2017 0010    TEST NOT REPORTED DUE TO COLOR INTERFERENCE OF URINE PIGMENT   LEUKOCYTESUR 3+ (A) 10/16/2017 1544   Sepsis Labs Invalid input(s): PROCALCITONIN,  WBC,  LACTICIDVEN Microbiology Recent Results (from the past 240 hour(s))  Culture, Urine     Status: None   Collection Time: 11/05/17 12:00 AM  Result Value Ref Range Status   Specimen Description   Final    URINE, CLEAN CATCH Performed at [facility], [address] [place], [place] [address]    Special Requests   Final    NONE Performed at [facility], [address], [place], [place] [address]    Culture   Final    NO GROWTH Performed at [facility], [place] [address], [place], [place] [address]     Report Status 11/06/2017 FINAL  Final     Time coordinating discharge: 36 minutes  SIGNED:   Georgette Shell, MD  [facility] 11/11/2017, 10:12 AM Pager   If 7PM-7AM, please contact night-coverage [email or web address] Password [email or web address]

## 2017-11-23 ENCOUNTER — Ambulatory Visit (INDEPENDENT_AMBULATORY_CARE_PROVIDER_SITE_OTHER): Payer: Medicare Other

## 2017-11-23 DIAGNOSIS — C671 Malignant neoplasm of dome of bladder: Secondary | ICD-10-CM

## 2017-12-28 ENCOUNTER — Telehealth: Payer: Self-pay | Admitting: *Deleted

## 2017-12-28 NOTE — Telephone Encounter (Signed)
FYI from [place] w/ Hospice Pt fell this morning, does not have any abrasions or bruises Foley catheter was removed She will go to Hospice house for the next 5 days then transition back home

## 2018-01-28 DEATH — deceased
